# Patient Record
Sex: Male | Born: 1945
Health system: Southern US, Community
[De-identification: ages and names within clinical notes are randomized; demographics above are authoritative.]

## PROBLEM LIST (undated history)

## (undated) DIAGNOSIS — I10 Essential (primary) hypertension: Secondary | ICD-10-CM

## (undated) DIAGNOSIS — G8929 Other chronic pain: Secondary | ICD-10-CM

## (undated) DIAGNOSIS — M545 Low back pain, unspecified: Secondary | ICD-10-CM

## (undated) DIAGNOSIS — E7800 Pure hypercholesterolemia, unspecified: Secondary | ICD-10-CM

## (undated) DIAGNOSIS — I639 Cerebral infarction, unspecified: Secondary | ICD-10-CM

## (undated) DIAGNOSIS — R7303 Prediabetes: Secondary | ICD-10-CM

## (undated) DIAGNOSIS — G4733 Obstructive sleep apnea (adult) (pediatric): Secondary | ICD-10-CM

## (undated) HISTORY — PX: TONSILLECTOMY: SUR1361

## (undated) HISTORY — DX: Obstructive sleep apnea (adult) (pediatric): G47.33

## (undated) HISTORY — PX: HAND SURGERY: SHX662

## (undated) HISTORY — PX: HERNIA REPAIR: SHX51

## (undated) HISTORY — DX: Other chronic pain: G89.29

## (undated) HISTORY — PX: NOSE SURGERY: SHX723

## (undated) HISTORY — DX: Prediabetes: R73.03

## (undated) HISTORY — DX: Low back pain, unspecified: M54.50

---

## 2001-03-10 ENCOUNTER — Encounter: Payer: Self-pay | Admitting: Family Medicine

## 2001-03-10 ENCOUNTER — Encounter: Admission: RE | Admit: 2001-03-10 | Discharge: 2001-03-10 | Payer: Self-pay | Admitting: Family Medicine

## 2020-05-20 DIAGNOSIS — M545 Low back pain: Secondary | ICD-10-CM | POA: Diagnosis not present

## 2020-05-20 DIAGNOSIS — L309 Dermatitis, unspecified: Secondary | ICD-10-CM | POA: Diagnosis not present

## 2020-05-20 DIAGNOSIS — Z8719 Personal history of other diseases of the digestive system: Secondary | ICD-10-CM | POA: Diagnosis not present

## 2020-05-20 DIAGNOSIS — R7303 Prediabetes: Secondary | ICD-10-CM | POA: Diagnosis not present

## 2020-05-20 DIAGNOSIS — R739 Hyperglycemia, unspecified: Secondary | ICD-10-CM | POA: Diagnosis not present

## 2020-05-20 DIAGNOSIS — Z8673 Personal history of transient ischemic attack (TIA), and cerebral infarction without residual deficits: Secondary | ICD-10-CM | POA: Diagnosis not present

## 2020-05-20 DIAGNOSIS — E782 Mixed hyperlipidemia: Secondary | ICD-10-CM | POA: Diagnosis not present

## 2020-05-21 DIAGNOSIS — H905 Unspecified sensorineural hearing loss: Secondary | ICD-10-CM | POA: Diagnosis not present

## 2020-06-03 DIAGNOSIS — L309 Dermatitis, unspecified: Secondary | ICD-10-CM | POA: Diagnosis not present

## 2020-06-03 DIAGNOSIS — M545 Low back pain: Secondary | ICD-10-CM | POA: Diagnosis not present

## 2020-06-03 DIAGNOSIS — Z8673 Personal history of transient ischemic attack (TIA), and cerebral infarction without residual deficits: Secondary | ICD-10-CM | POA: Diagnosis not present

## 2020-06-03 DIAGNOSIS — Z8719 Personal history of other diseases of the digestive system: Secondary | ICD-10-CM | POA: Diagnosis not present

## 2020-06-03 DIAGNOSIS — Z7689 Persons encountering health services in other specified circumstances: Secondary | ICD-10-CM | POA: Diagnosis not present

## 2020-06-24 DIAGNOSIS — Z7689 Persons encountering health services in other specified circumstances: Secondary | ICD-10-CM | POA: Diagnosis not present

## 2020-06-24 DIAGNOSIS — E782 Mixed hyperlipidemia: Secondary | ICD-10-CM | POA: Diagnosis not present

## 2020-06-24 DIAGNOSIS — Z8673 Personal history of transient ischemic attack (TIA), and cerebral infarction without residual deficits: Secondary | ICD-10-CM | POA: Diagnosis not present

## 2020-06-24 DIAGNOSIS — B379 Candidiasis, unspecified: Secondary | ICD-10-CM | POA: Diagnosis not present

## 2020-06-24 DIAGNOSIS — I10 Essential (primary) hypertension: Secondary | ICD-10-CM | POA: Diagnosis not present

## 2020-07-25 DIAGNOSIS — Z7189 Other specified counseling: Secondary | ICD-10-CM | POA: Diagnosis not present

## 2020-07-25 DIAGNOSIS — M545 Low back pain, unspecified: Secondary | ICD-10-CM | POA: Diagnosis not present

## 2020-07-25 DIAGNOSIS — Z0001 Encounter for general adult medical examination with abnormal findings: Secondary | ICD-10-CM | POA: Diagnosis not present

## 2020-08-06 DIAGNOSIS — Z23 Encounter for immunization: Secondary | ICD-10-CM | POA: Diagnosis not present

## 2020-08-11 DIAGNOSIS — L918 Other hypertrophic disorders of the skin: Secondary | ICD-10-CM | POA: Diagnosis not present

## 2020-08-18 DIAGNOSIS — L918 Other hypertrophic disorders of the skin: Secondary | ICD-10-CM | POA: Diagnosis not present

## 2020-09-18 DIAGNOSIS — I10 Essential (primary) hypertension: Secondary | ICD-10-CM | POA: Diagnosis not present

## 2020-09-18 DIAGNOSIS — M25551 Pain in right hip: Secondary | ICD-10-CM | POA: Diagnosis not present

## 2020-09-18 DIAGNOSIS — M25561 Pain in right knee: Secondary | ICD-10-CM | POA: Diagnosis not present

## 2020-10-02 DIAGNOSIS — I10 Essential (primary) hypertension: Secondary | ICD-10-CM | POA: Diagnosis not present

## 2020-10-04 DIAGNOSIS — E782 Mixed hyperlipidemia: Secondary | ICD-10-CM | POA: Diagnosis not present

## 2020-10-04 DIAGNOSIS — I10 Essential (primary) hypertension: Secondary | ICD-10-CM | POA: Diagnosis not present

## 2020-10-30 DIAGNOSIS — M25551 Pain in right hip: Secondary | ICD-10-CM | POA: Diagnosis not present

## 2020-10-30 DIAGNOSIS — I10 Essential (primary) hypertension: Secondary | ICD-10-CM | POA: Diagnosis not present

## 2020-10-30 DIAGNOSIS — Z23 Encounter for immunization: Secondary | ICD-10-CM | POA: Diagnosis not present

## 2020-10-30 DIAGNOSIS — M25561 Pain in right knee: Secondary | ICD-10-CM | POA: Diagnosis not present

## 2020-11-03 DIAGNOSIS — E782 Mixed hyperlipidemia: Secondary | ICD-10-CM | POA: Diagnosis not present

## 2020-11-03 DIAGNOSIS — I10 Essential (primary) hypertension: Secondary | ICD-10-CM | POA: Diagnosis not present

## 2020-11-26 DIAGNOSIS — G8929 Other chronic pain: Secondary | ICD-10-CM | POA: Diagnosis not present

## 2020-11-26 DIAGNOSIS — M25561 Pain in right knee: Secondary | ICD-10-CM | POA: Diagnosis not present

## 2020-12-03 DIAGNOSIS — E782 Mixed hyperlipidemia: Secondary | ICD-10-CM | POA: Diagnosis not present

## 2020-12-03 DIAGNOSIS — I10 Essential (primary) hypertension: Secondary | ICD-10-CM | POA: Diagnosis not present

## 2020-12-26 DIAGNOSIS — G8929 Other chronic pain: Secondary | ICD-10-CM | POA: Diagnosis not present

## 2021-01-01 ENCOUNTER — Other Ambulatory Visit: Payer: Self-pay

## 2021-01-01 ENCOUNTER — Emergency Department (HOSPITAL_COMMUNITY): Payer: Medicare Other

## 2021-01-01 ENCOUNTER — Emergency Department (HOSPITAL_COMMUNITY)
Admission: EM | Admit: 2021-01-01 | Discharge: 2021-01-01 | Disposition: A | Discharge status: Home / Self Care | Payer: Medicare Other | Attending: Emergency Medicine | Admitting: Emergency Medicine

## 2021-01-01 DIAGNOSIS — J320 Chronic maxillary sinusitis: Secondary | ICD-10-CM | POA: Diagnosis not present

## 2021-01-01 DIAGNOSIS — R61 Generalized hyperhidrosis: Secondary | ICD-10-CM | POA: Diagnosis not present

## 2021-01-01 DIAGNOSIS — R42 Dizziness and giddiness: Secondary | ICD-10-CM | POA: Insufficient documentation

## 2021-01-01 DIAGNOSIS — R400 Somnolence: Secondary | ICD-10-CM | POA: Diagnosis not present

## 2021-01-01 LAB — COMPREHENSIVE METABOLIC PANEL
ALT: 40 U/L (ref 0–44)
AST: 25 U/L (ref 15–41)
Albumin: 4.1 g/dL (ref 3.5–5.0)
Alkaline Phosphatase: 60 U/L (ref 38–126)
Anion gap: 13 (ref 5–15)
BUN: 23 mg/dL (ref 8–23)
CO2: 26 mmol/L (ref 22–32)
Calcium: 8.8 mg/dL — ABNORMAL LOW (ref 8.9–10.3)
Chloride: 91 mmol/L — ABNORMAL LOW (ref 98–111)
Creatinine, Ser: 1.55 mg/dL — ABNORMAL HIGH (ref 0.61–1.24)
GFR, Estimated: 47 mL/min — ABNORMAL LOW (ref >60)
Glucose, Bld: 121 mg/dL — ABNORMAL HIGH (ref 70–99)
Potassium: 3 mmol/L — ABNORMAL LOW (ref 3.5–5.1)
Sodium: 130 mmol/L — ABNORMAL LOW (ref 135–145)
Total Bilirubin: 1 mg/dL (ref 0.3–1.2)
Total Protein: 7.4 g/dL (ref 6.5–8.1)

## 2021-01-01 LAB — CBC WITH DIFFERENTIAL/PLATELET
Abs Immature Granulocytes: 0.1 10*3/uL — ABNORMAL HIGH (ref 0.00–0.07)
Basophils Absolute: 0.1 10*3/uL (ref 0.0–0.1)
Basophils Relative: 1 %
Eosinophils Absolute: 0.2 10*3/uL (ref 0.0–0.5)
Eosinophils Relative: 2 %
HCT: 44.2 % (ref 39.0–52.0)
Hemoglobin: 16 g/dL (ref 13.0–17.0)
Immature Granulocytes: 1 %
Lymphocytes Relative: 19 %
Lymphs Abs: 2.1 10*3/uL (ref 0.7–4.0)
MCH: 33.2 pg (ref 26.0–34.0)
MCHC: 36.2 g/dL — ABNORMAL HIGH (ref 30.0–36.0)
MCV: 91.7 fL (ref 80.0–100.0)
Monocytes Absolute: 1.4 10*3/uL — ABNORMAL HIGH (ref 0.1–1.0)
Monocytes Relative: 12 %
Neutro Abs: 7.4 10*3/uL (ref 1.7–7.7)
Neutrophils Relative %: 65 %
Platelets: 257 10*3/uL (ref 150–400)
RBC: 4.82 MIL/uL (ref 4.22–5.81)
RDW: 13.4 % (ref 11.5–15.5)
WBC: 11.3 10*3/uL — ABNORMAL HIGH (ref 4.0–10.5)
nRBC: 0 % (ref 0.0–0.2)

## 2021-01-01 LAB — TROPONIN I (HIGH SENSITIVITY)
Troponin I (High Sensitivity): 5 ng/L (ref <18)
Troponin I (High Sensitivity): 6 ng/L (ref <18)

## 2021-01-01 IMAGING — CT CT HEAD W/O CM
3 series · 15 of 46 positions shown, 18 images · non-contrast
Comparison: None.

CLINICAL DATA: Dizzy, sweaty, thirsty, and sleepy.

EXAM:
CT HEAD WITHOUT CONTRAST
TECHNIQUE: Contiguous axial images were obtained from the base of the skull
through the vertex without intravenous contrast.

[Series 2: head w o · axial · 0.47mm/px · z∈[+1456,+1576]mm · 9 of 29 slices shown, 12 images]
[im 3/29  brain]
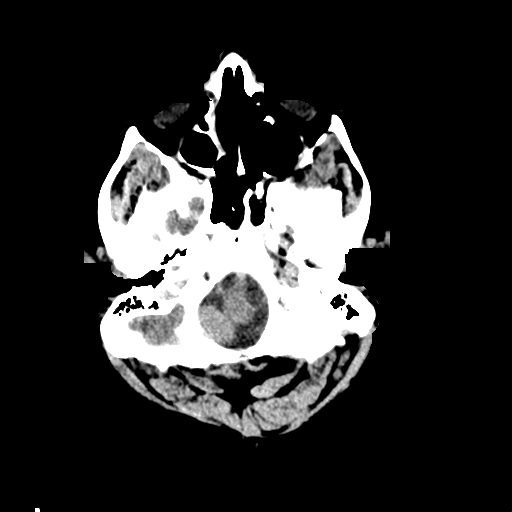
[im 3/29  bone]
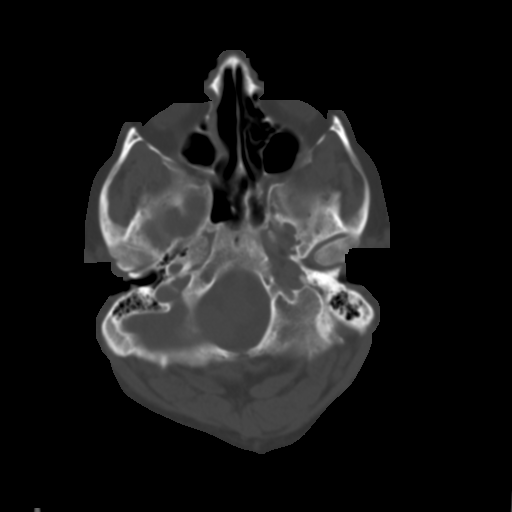
[im 6/29  brain]
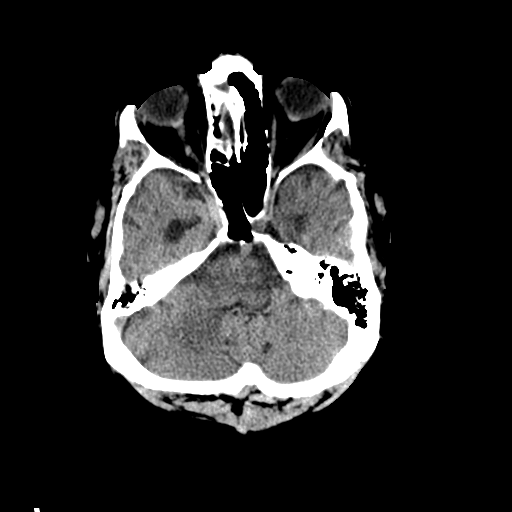
[im 9/29  brain]
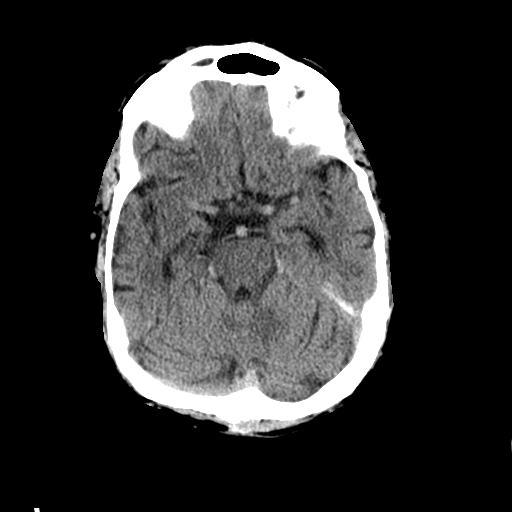
[im 12/29  brain]
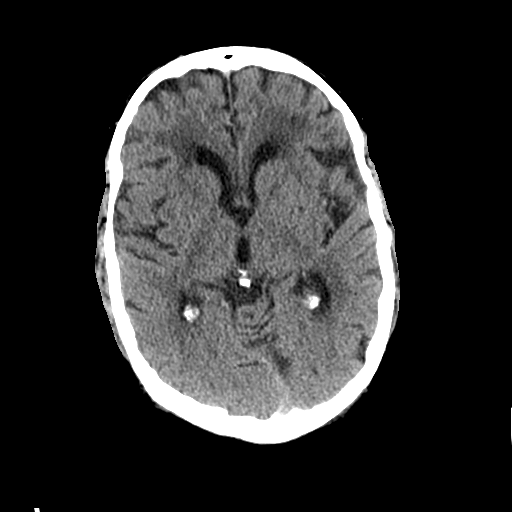
[im 15/29  brain]
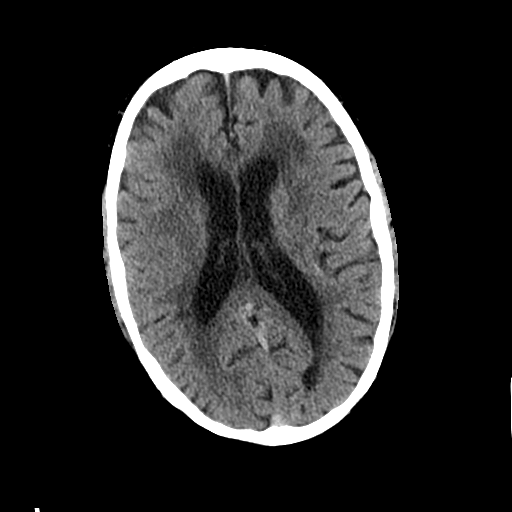
[im 15/29  bone]
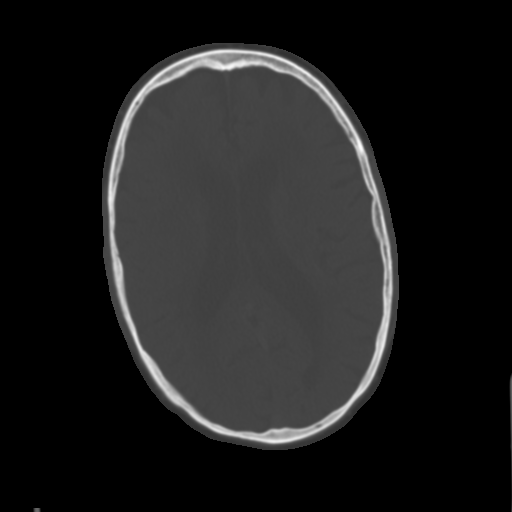
[im 18/29  brain]
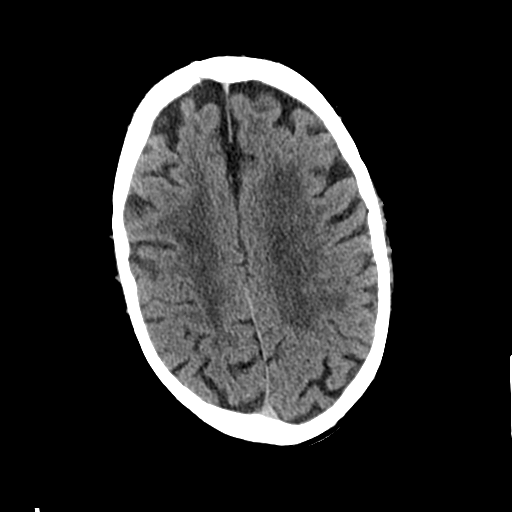
[im 21/29  brain]
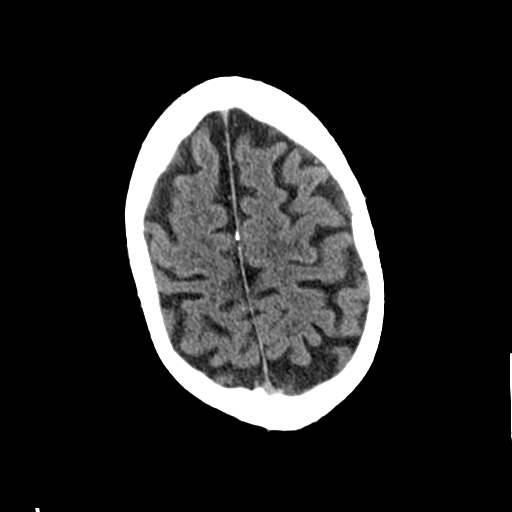
[im 24/29  brain]
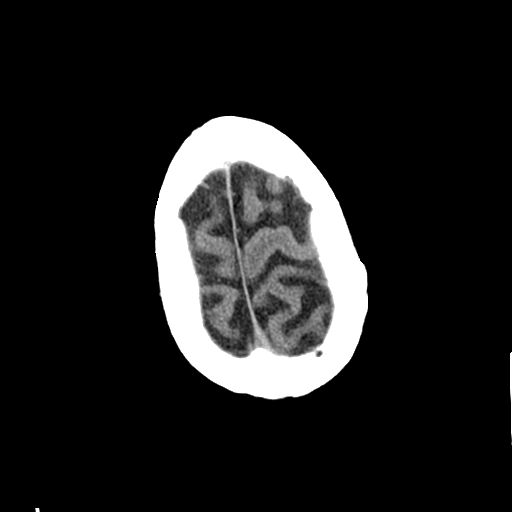
[im 27/29  brain]
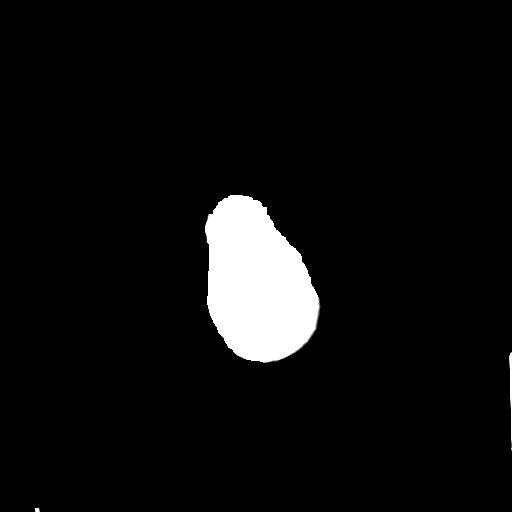
[im 27/29  bone]
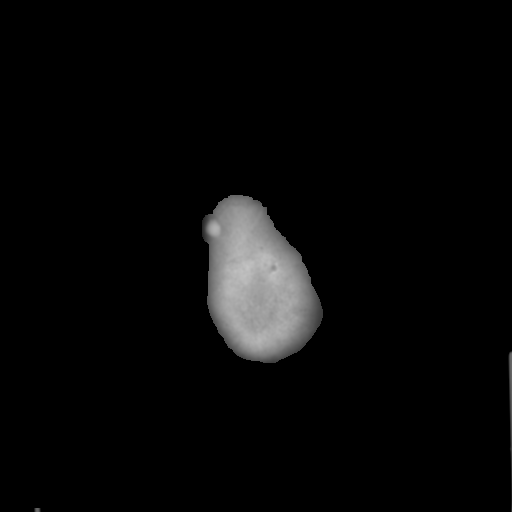

[Series 4: coronal soft · coronal · 0.31mm/px · 3 of 72 slices shown]
[im 24/72  brain]
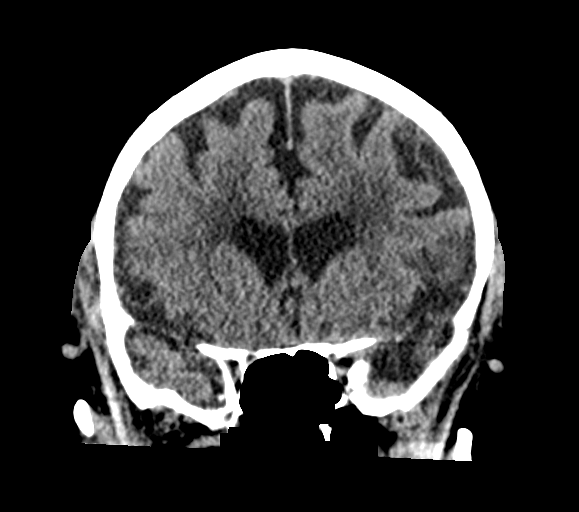
[im 32/72  brain]
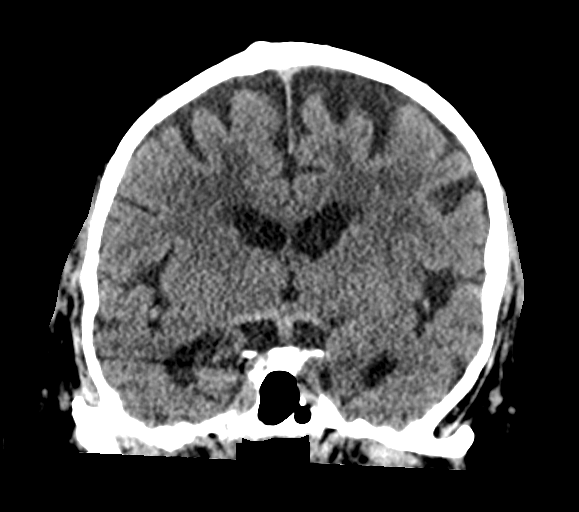
[im 40/72  brain]
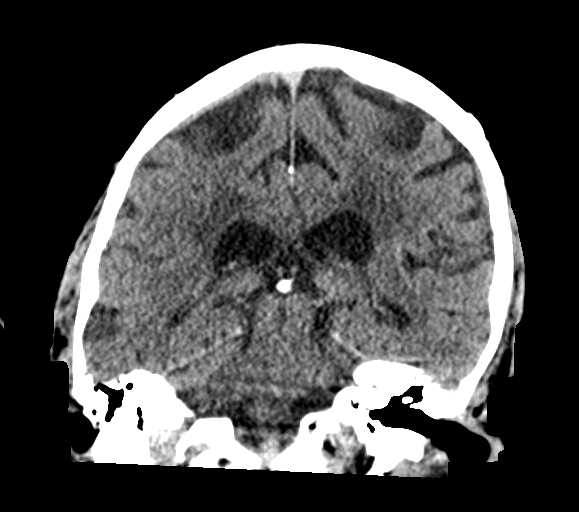

[Series 5: sagittal soft · sagittal · 0.34mm/px · 3 of 58 slices shown]
[im 20/58  brain]
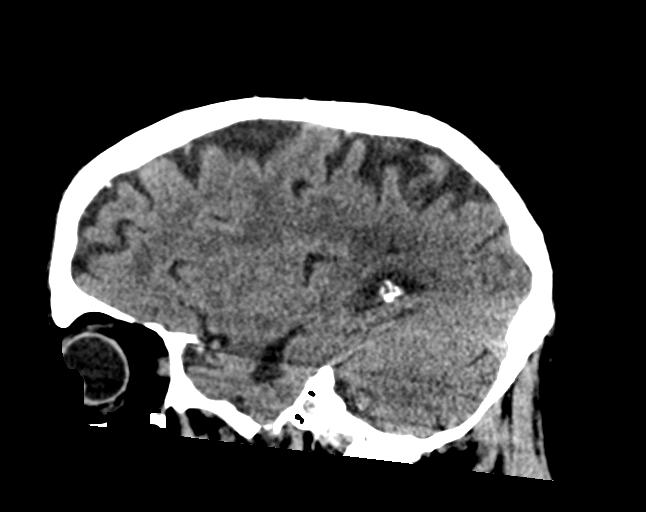
[im 29/58  brain]
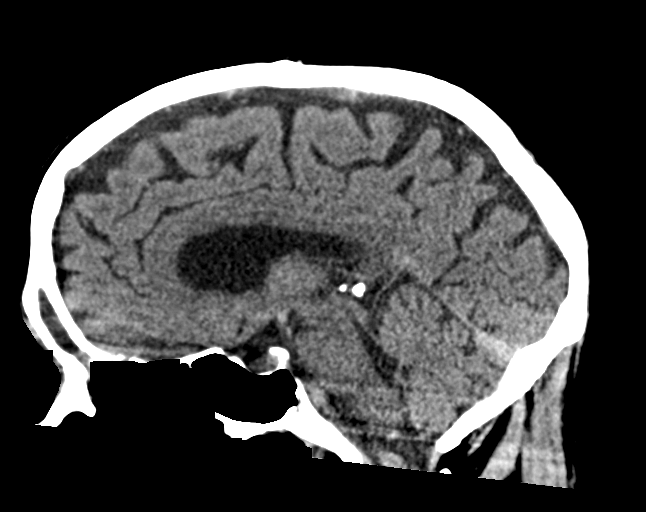
[im 39/58  brain]
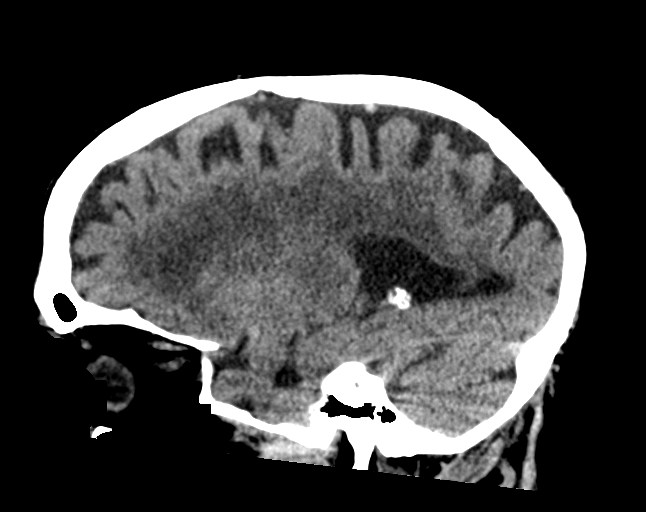

[15 of 46 positions shown; findings below may reference images not displayed]

FINDINGS: Brain: No evidence of acute infarction, hemorrhage, hydrocephalus,
extra-axial collection or mass lesion/mass effect. There is mild
cerebral volume loss with associated ex vacuo dilatation.
Periventricular white matter hypoattenuation likely represents
chronic small vessel ischemic disease.

Vascular: There are vascular calcifications in the carotid siphons.

Skull: Normal. Negative for fracture or focal lesion.

Sinuses/Orbits: There is right maxillary, ethmoid, and frontal sinus
disease.

Other: None.
IMPRESSION: 1. No acute intracranial process.
2. Right maxillary, ethmoid, and frontal sinus disease.

## 2021-01-01 MED ORDER — POTASSIUM CHLORIDE CRYS ER 20 MEQ PO TBCR
40.0000 meq | EXTENDED_RELEASE_TABLET | Freq: Once | ORAL | Status: AC
  Administered 2021-01-01: 40 meq via ORAL
  Filled 2021-01-01: qty 2

## 2021-01-01 MED ORDER — SODIUM CHLORIDE 0.9 % IV BOLUS
500.0000 mL | Freq: Once | INTRAVENOUS | Status: AC
  Administered 2021-01-01: 500 mL via INTRAVENOUS

## 2021-01-01 NOTE — ED Triage Notes (Signed)
Pt reports he began feeling dizzy, sweaty, thirsty and sleepy approx 1 hour ago. + nausea, no vomiting. Hx vertigo.

## 2021-01-01 NOTE — ED Notes (Signed)
Pt in CT at this time.

## 2021-01-01 NOTE — Discharge Instructions (Addendum)
Stop taking the tramadol and call your doctor tomorrow and tell him about the pain medicine.

## 2021-01-01 NOTE — ED Notes (Signed)
Pt given water per EDP ok

## 2021-01-01 NOTE — ED Provider Notes (Signed)
[facility] EMERGENCY DEPARTMENT Provider Note   CSN: [id number] Arrival date & time: 01/01/21  1943     History Chief Complaint  Patient presents with  . Dizziness    Austin Bruce is a 75 y.o. male.  Patient presents with some dizziness today.  He started taking a new medicine called tramadol 300 mg ER.  Patient also states that he had dizziness when he had a stroke last time but it was vertigo symptoms  The history is provided by the patient and medical records. No language interpreter was used.  Dizziness Quality:  Lightheadedness Severity:  Mild Onset quality:  Sudden Timing:  Constant Progression:  Worsening Chronicity:  New Context: not when bending over   Relieved by:  Nothing Worsened by:  Nothing Ineffective treatments:  None tried Associated symptoms: no chest pain, no diarrhea and no headaches        No past medical history on file.  There are no problems to display for this patient.        No family history on file.     Home Medications Prior to Admission medications   Not on File    Allergies    Penicillins  Review of Systems   Review of Systems  Constitutional: Negative for appetite change and fatigue.  HENT: Negative for congestion, ear discharge and sinus pressure.   Eyes: Negative for discharge.  Respiratory: Negative for cough.   Cardiovascular: Negative for chest pain.  Gastrointestinal: Negative for abdominal pain and diarrhea.  Genitourinary: Negative for frequency and hematuria.  Musculoskeletal: Negative for back pain.  Skin: Negative for rash.  Neurological: Positive for dizziness. Negative for seizures and headaches.  Psychiatric/Behavioral: Negative for hallucinations.    Physical Exam Updated Vital Signs BP (!) 150/86   Pulse 62   Temp 97.9 F (36.6 C) (Oral)   Resp 13   Ht 5\' 9"  (1.753 m)   Wt 104.4 kg   SpO2 98%   BMI 33.99 kg/m   Physical Exam Vitals and nursing note reviewed.  Constitutional:       Appearance: He is well-developed.  HENT:     Head: Normocephalic.     Nose: Nose normal.  Eyes:     General: No scleral icterus.    Conjunctiva/sclera: Conjunctivae normal.  Neck:     Thyroid: No thyromegaly.  Cardiovascular:     Rate and Rhythm: Normal rate and regular rhythm.     Heart sounds: No murmur heard. No friction rub. No gallop.   Pulmonary:     Breath sounds: No stridor. No wheezing or rales.  Chest:     Chest wall: No tenderness.  Abdominal:     General: There is no distension.     Tenderness: There is no abdominal tenderness. There is no rebound.  Musculoskeletal:        General: Normal range of motion.     Cervical back: Neck supple.  Lymphadenopathy:     Cervical: No cervical adenopathy.  Skin:    Findings: No erythema or rash.  Neurological:     Mental Status: He is alert and oriented to person, place, and time.     Motor: No abnormal muscle tone.     Coordination: Coordination normal.  Psychiatric:        Behavior: Behavior normal.     ED Results / Procedures / Treatments   Labs (all labs ordered are listed, but only abnormal results are displayed) Labs Reviewed  CBC WITH DIFFERENTIAL/PLATELET - Abnormal; Notable  for the following components:      Result Value   WBC 11.3 (*)    MCHC 36.2 (*)    Monocytes Absolute 1.4 (*)    Abs Immature Granulocytes 0.10 (*)    All other components within normal limits  COMPREHENSIVE METABOLIC PANEL - Abnormal; Notable for the following components:   Sodium 130 (*)    Potassium 3.0 (*)    Chloride 91 (*)    Glucose, Bld 121 (*)    Creatinine, Ser 1.55 (*)    Calcium 8.8 (*)    GFR, Estimated 47 (*)    All other components within normal limits  TROPONIN I (HIGH SENSITIVITY)  TROPONIN I (HIGH SENSITIVITY)    EKG None  Radiology CT Head Wo Contrast  Result Date: 01/01/2021 CLINICAL DATA:  Dizzy, sweaty, thirsty, and sleepy. EXAM: CT HEAD WITHOUT CONTRAST TECHNIQUE: Contiguous axial images were obtained  from the base of the skull through the vertex without intravenous contrast. COMPARISON:  None. FINDINGS: Brain: No evidence of acute infarction, hemorrhage, hydrocephalus, extra-axial collection or mass lesion/mass effect. There is mild cerebral volume loss with associated ex vacuo dilatation. Periventricular white matter hypoattenuation likely represents chronic small vessel ischemic disease. Vascular: There are vascular calcifications in the carotid siphons. Skull: Normal. Negative for fracture or focal lesion. Sinuses/Orbits: There is right maxillary, ethmoid, and frontal sinus disease. Other: None. IMPRESSION: 1. No acute intracranial process. 2. Right maxillary, ethmoid, and frontal sinus disease. Electronically Signed   By: Zerita Boers M.D.   On: 01/01/2021 21:07    Procedures Procedures   Medications Ordered in ED Medications  potassium chloride SA (KLOR-CON) CR tablet 40 mEq (has no administration in time range)  sodium chloride 0.9 % bolus 500 mL (0 mLs Intravenous Stopped 01/01/21 2214)    ED Course  I have reviewed the triage vital signs and the nursing notes.  Pertinent labs & imaging results that were available during my care of the patient were reviewed by me and considered in my medical decision making (see chart for details). CRITICAL CARE Performed by: Milton Ferguson Total critical care time: 45  minutes Critical care time was exclusive of separately billable procedures and treating other patients. Critical care was necessary to treat or prevent imminent or life-threatening deterioration. Critical care was time spent personally by me on the following activities: development of treatment plan with patient and/or surrogate as well as nursing, discussions with consultants, evaluation of patient's response to treatment, examination of patient, obtaining history from patient or surrogate, ordering and performing treatments and interventions, ordering and review of laboratory studies,  ordering and review of radiographic studies, pulse oximetry and re-evaluation of patient's condition.    MDM Rules/Calculators/A&P                          Patient labs and EKG, CT are all unremarkable.  Patient's dizziness has improved.  Suspect tramadol causing the dizziness.  He will stop that and call his doctor tomorrow for other medicine Final Clinical Impression(s) / ED Diagnoses Final diagnoses:  None    Rx / DC Orders ED Discharge Orders    None       Milton Ferguson, MD 01/03/21 1519

## 2021-01-03 DIAGNOSIS — E782 Mixed hyperlipidemia: Secondary | ICD-10-CM | POA: Diagnosis not present

## 2021-01-03 DIAGNOSIS — I10 Essential (primary) hypertension: Secondary | ICD-10-CM | POA: Diagnosis not present

## 2021-01-05 DIAGNOSIS — G8929 Other chronic pain: Secondary | ICD-10-CM | POA: Diagnosis not present

## 2021-01-05 DIAGNOSIS — R42 Dizziness and giddiness: Secondary | ICD-10-CM | POA: Diagnosis not present

## 2021-01-05 DIAGNOSIS — D229 Melanocytic nevi, unspecified: Secondary | ICD-10-CM | POA: Diagnosis not present

## 2021-01-05 DIAGNOSIS — Z09 Encounter for follow-up examination after completed treatment for conditions other than malignant neoplasm: Secondary | ICD-10-CM | POA: Diagnosis not present

## 2021-01-05 DIAGNOSIS — M545 Low back pain, unspecified: Secondary | ICD-10-CM | POA: Diagnosis not present

## 2021-01-13 DIAGNOSIS — L820 Inflamed seborrheic keratosis: Secondary | ICD-10-CM | POA: Diagnosis not present

## 2021-01-13 DIAGNOSIS — D229 Melanocytic nevi, unspecified: Secondary | ICD-10-CM | POA: Diagnosis not present

## 2021-01-13 DIAGNOSIS — I781 Nevus, non-neoplastic: Secondary | ICD-10-CM | POA: Diagnosis not present

## 2021-02-02 DIAGNOSIS — E782 Mixed hyperlipidemia: Secondary | ICD-10-CM | POA: Diagnosis not present

## 2021-02-02 DIAGNOSIS — I10 Essential (primary) hypertension: Secondary | ICD-10-CM | POA: Diagnosis not present

## 2021-03-05 DIAGNOSIS — E782 Mixed hyperlipidemia: Secondary | ICD-10-CM | POA: Diagnosis not present

## 2021-03-05 DIAGNOSIS — I10 Essential (primary) hypertension: Secondary | ICD-10-CM | POA: Diagnosis not present

## 2021-03-28 ENCOUNTER — Emergency Department (HOSPITAL_COMMUNITY): Payer: Medicare Other

## 2021-03-28 ENCOUNTER — Other Ambulatory Visit: Payer: Self-pay

## 2021-03-28 ENCOUNTER — Observation Stay (HOSPITAL_COMMUNITY)
Admission: EM | Admit: 2021-03-28 | Discharge: 2021-03-29 | Disposition: A | Discharge status: Home / Self Care | Payer: Medicare Other | Attending: Internal Medicine | Admitting: Internal Medicine

## 2021-03-28 ENCOUNTER — Encounter (HOSPITAL_COMMUNITY): Payer: Self-pay | Admitting: *Deleted

## 2021-03-28 ENCOUNTER — Observation Stay (HOSPITAL_COMMUNITY): Payer: Medicare Other

## 2021-03-28 DIAGNOSIS — H8110 Benign paroxysmal vertigo, unspecified ear: Secondary | ICD-10-CM

## 2021-03-28 DIAGNOSIS — R42 Dizziness and giddiness: Secondary | ICD-10-CM | POA: Diagnosis not present

## 2021-03-28 DIAGNOSIS — Z20822 Contact with and (suspected) exposure to covid-19: Secondary | ICD-10-CM | POA: Insufficient documentation

## 2021-03-28 DIAGNOSIS — Y900 Blood alcohol level of less than 20 mg/100 ml: Secondary | ICD-10-CM | POA: Insufficient documentation

## 2021-03-28 DIAGNOSIS — Z79899 Other long term (current) drug therapy: Secondary | ICD-10-CM | POA: Diagnosis not present

## 2021-03-28 DIAGNOSIS — I6381 Other cerebral infarction due to occlusion or stenosis of small artery: Secondary | ICD-10-CM | POA: Diagnosis not present

## 2021-03-28 DIAGNOSIS — I639 Cerebral infarction, unspecified: Secondary | ICD-10-CM | POA: Diagnosis not present

## 2021-03-28 DIAGNOSIS — R29818 Other symptoms and signs involving the nervous system: Secondary | ICD-10-CM | POA: Diagnosis not present

## 2021-03-28 DIAGNOSIS — Z8673 Personal history of transient ischemic attack (TIA), and cerebral infarction without residual deficits: Secondary | ICD-10-CM | POA: Diagnosis not present

## 2021-03-28 DIAGNOSIS — I6782 Cerebral ischemia: Secondary | ICD-10-CM | POA: Diagnosis not present

## 2021-03-28 DIAGNOSIS — Z87891 Personal history of nicotine dependence: Secondary | ICD-10-CM

## 2021-03-28 DIAGNOSIS — Z8719 Personal history of other diseases of the digestive system: Secondary | ICD-10-CM

## 2021-03-28 DIAGNOSIS — R200 Anesthesia of skin: Secondary | ICD-10-CM | POA: Diagnosis not present

## 2021-03-28 DIAGNOSIS — N1832 Chronic kidney disease, stage 3b: Secondary | ICD-10-CM | POA: Diagnosis not present

## 2021-03-28 DIAGNOSIS — G319 Degenerative disease of nervous system, unspecified: Secondary | ICD-10-CM | POA: Diagnosis not present

## 2021-03-28 DIAGNOSIS — R41841 Cognitive communication deficit: Secondary | ICD-10-CM | POA: Insufficient documentation

## 2021-03-28 DIAGNOSIS — E7800 Pure hypercholesterolemia, unspecified: Secondary | ICD-10-CM | POA: Diagnosis not present

## 2021-03-28 DIAGNOSIS — I10 Essential (primary) hypertension: Secondary | ICD-10-CM | POA: Diagnosis present

## 2021-03-28 DIAGNOSIS — I129 Hypertensive chronic kidney disease with stage 1 through stage 4 chronic kidney disease, or unspecified chronic kidney disease: Secondary | ICD-10-CM | POA: Diagnosis not present

## 2021-03-28 DIAGNOSIS — E876 Hypokalemia: Secondary | ICD-10-CM | POA: Diagnosis present

## 2021-03-28 HISTORY — DX: Cerebral infarction, unspecified: I63.9

## 2021-03-28 HISTORY — DX: Essential (primary) hypertension: I10

## 2021-03-28 HISTORY — DX: Pure hypercholesterolemia, unspecified: E78.00

## 2021-03-28 LAB — DIFFERENTIAL
Abs Immature Granulocytes: 0.05 10*3/uL (ref 0.00–0.07)
Basophils Absolute: 0.1 10*3/uL (ref 0.0–0.1)
Basophils Relative: 1 %
Eosinophils Absolute: 0.3 10*3/uL (ref 0.0–0.5)
Eosinophils Relative: 3 %
Immature Granulocytes: 1 %
Lymphocytes Relative: 17 %
Lymphs Abs: 1.7 10*3/uL (ref 0.7–4.0)
Monocytes Absolute: 1.4 10*3/uL — ABNORMAL HIGH (ref 0.1–1.0)
Monocytes Relative: 14 %
Neutro Abs: 6.3 10*3/uL (ref 1.7–7.7)
Neutrophils Relative %: 64 %

## 2021-03-28 LAB — URINALYSIS, ROUTINE W REFLEX MICROSCOPIC
Bilirubin Urine: NEGATIVE
Glucose, UA: NEGATIVE mg/dL
Hgb urine dipstick: NEGATIVE
Ketones, ur: NEGATIVE mg/dL
Leukocytes,Ua: NEGATIVE
Nitrite: NEGATIVE
Protein, ur: NEGATIVE mg/dL
Specific Gravity, Urine: 1.004 — ABNORMAL LOW (ref 1.005–1.030)
pH: 7 (ref 5.0–8.0)

## 2021-03-28 LAB — CBC
HCT: 44.6 % (ref 39.0–52.0)
Hemoglobin: 16.1 g/dL (ref 13.0–17.0)
MCH: 33.1 pg (ref 26.0–34.0)
MCHC: 36.1 g/dL — ABNORMAL HIGH (ref 30.0–36.0)
MCV: 91.8 fL (ref 80.0–100.0)
Platelets: 242 10*3/uL (ref 150–400)
RBC: 4.86 MIL/uL (ref 4.22–5.81)
RDW: 13.4 % (ref 11.5–15.5)
WBC: 9.7 10*3/uL (ref 4.0–10.5)
nRBC: 0 % (ref 0.0–0.2)

## 2021-03-28 LAB — RESP PANEL BY RT-PCR (FLU A&B, COVID) ARPGX2
Influenza A by PCR: NEGATIVE
Influenza B by PCR: NEGATIVE
SARS Coronavirus 2 by RT PCR: NEGATIVE

## 2021-03-28 LAB — I-STAT CHEM 8, ED
BUN: 15 mg/dL (ref 8–23)
Calcium, Ion: 1.17 mmol/L (ref 1.15–1.40)
Chloride: 95 mmol/L — ABNORMAL LOW (ref 98–111)
Creatinine, Ser: 1.7 mg/dL — ABNORMAL HIGH (ref 0.61–1.24)
Glucose, Bld: 107 mg/dL — ABNORMAL HIGH (ref 70–99)
HCT: 47 % (ref 39.0–52.0)
Hemoglobin: 16 g/dL (ref 13.0–17.0)
Potassium: 3.4 mmol/L — ABNORMAL LOW (ref 3.5–5.1)
Sodium: 134 mmol/L — ABNORMAL LOW (ref 135–145)
TCO2: 29 mmol/L (ref 22–32)

## 2021-03-28 LAB — COMPREHENSIVE METABOLIC PANEL
ALT: 39 U/L (ref 0–44)
AST: 26 U/L (ref 15–41)
Albumin: 4 g/dL (ref 3.5–5.0)
Alkaline Phosphatase: 58 U/L (ref 38–126)
Anion gap: 8 (ref 5–15)
BUN: 15 mg/dL (ref 8–23)
CO2: 26 mmol/L (ref 22–32)
Calcium: 9.1 mg/dL (ref 8.9–10.3)
Chloride: 97 mmol/L — ABNORMAL LOW (ref 98–111)
Creatinine, Ser: 1.62 mg/dL — ABNORMAL HIGH (ref 0.61–1.24)
GFR, Estimated: 44 mL/min — ABNORMAL LOW (ref >60)
Glucose, Bld: 108 mg/dL — ABNORMAL HIGH (ref 70–99)
Potassium: 3.2 mmol/L — ABNORMAL LOW (ref 3.5–5.1)
Sodium: 131 mmol/L — ABNORMAL LOW (ref 135–145)
Total Bilirubin: 1.4 mg/dL — ABNORMAL HIGH (ref 0.3–1.2)
Total Protein: 7.5 g/dL (ref 6.5–8.1)

## 2021-03-28 LAB — APTT: aPTT: 27 seconds (ref 24–36)

## 2021-03-28 LAB — RAPID URINE DRUG SCREEN, HOSP PERFORMED
Amphetamines: NOT DETECTED
Barbiturates: NOT DETECTED
Benzodiazepines: NOT DETECTED
Cocaine: NOT DETECTED
Opiates: NOT DETECTED
Tetrahydrocannabinol: NOT DETECTED

## 2021-03-28 LAB — CBG MONITORING, ED: Glucose-Capillary: 104 mg/dL — ABNORMAL HIGH (ref 70–99)

## 2021-03-28 LAB — PROTIME-INR
INR: 1 (ref 0.8–1.2)
Prothrombin Time: 13.3 seconds (ref 11.4–15.2)

## 2021-03-28 LAB — ETHANOL: Alcohol, Ethyl (B): <10 mg/dL (ref <10)

## 2021-03-28 IMAGING — MR MR MRA NECK W/O CM
4 of 6 series · 6 of 48 positions shown · non-contrast
Comparison: Head CT [DATE]

CLINICAL DATA: Neuro deficit, acute, stroke suspected. Dizziness.
Numbness in the legs.



[Series 13: sag inhance (id) · sagittal · 1.2mm · 0.47mm/px · 3 of 359 slices shown]
[im 62/359]
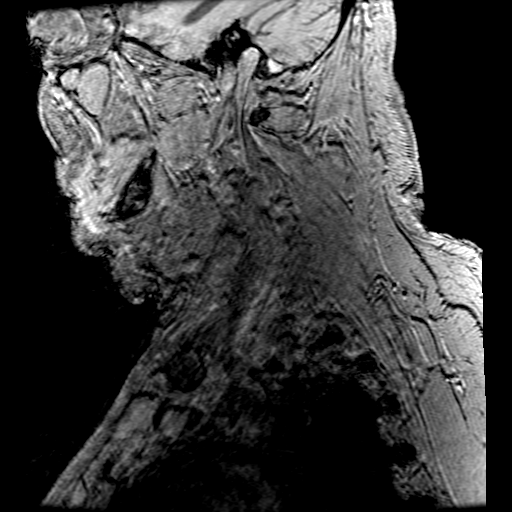
[im 186/359]
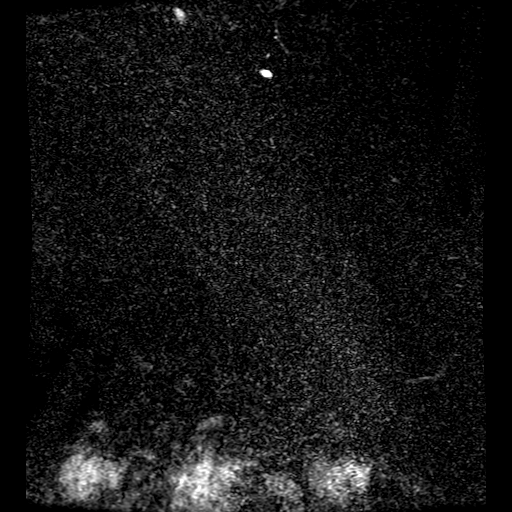
[im 309/359]
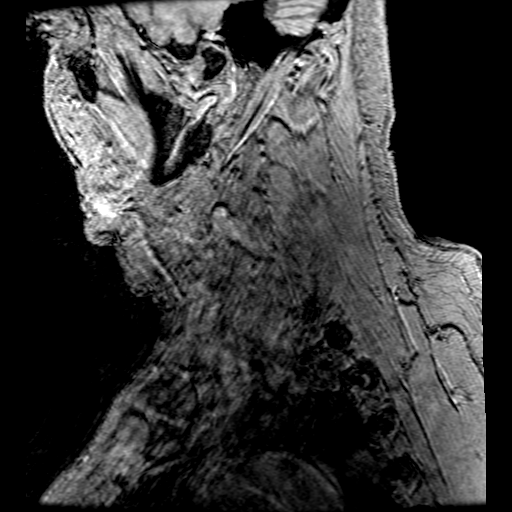

[Series 1350: projection images · oblique · 1.2mm · 0.21mm/px · 1 of 3 slices shown (1 of 3)]
[im 1/3]
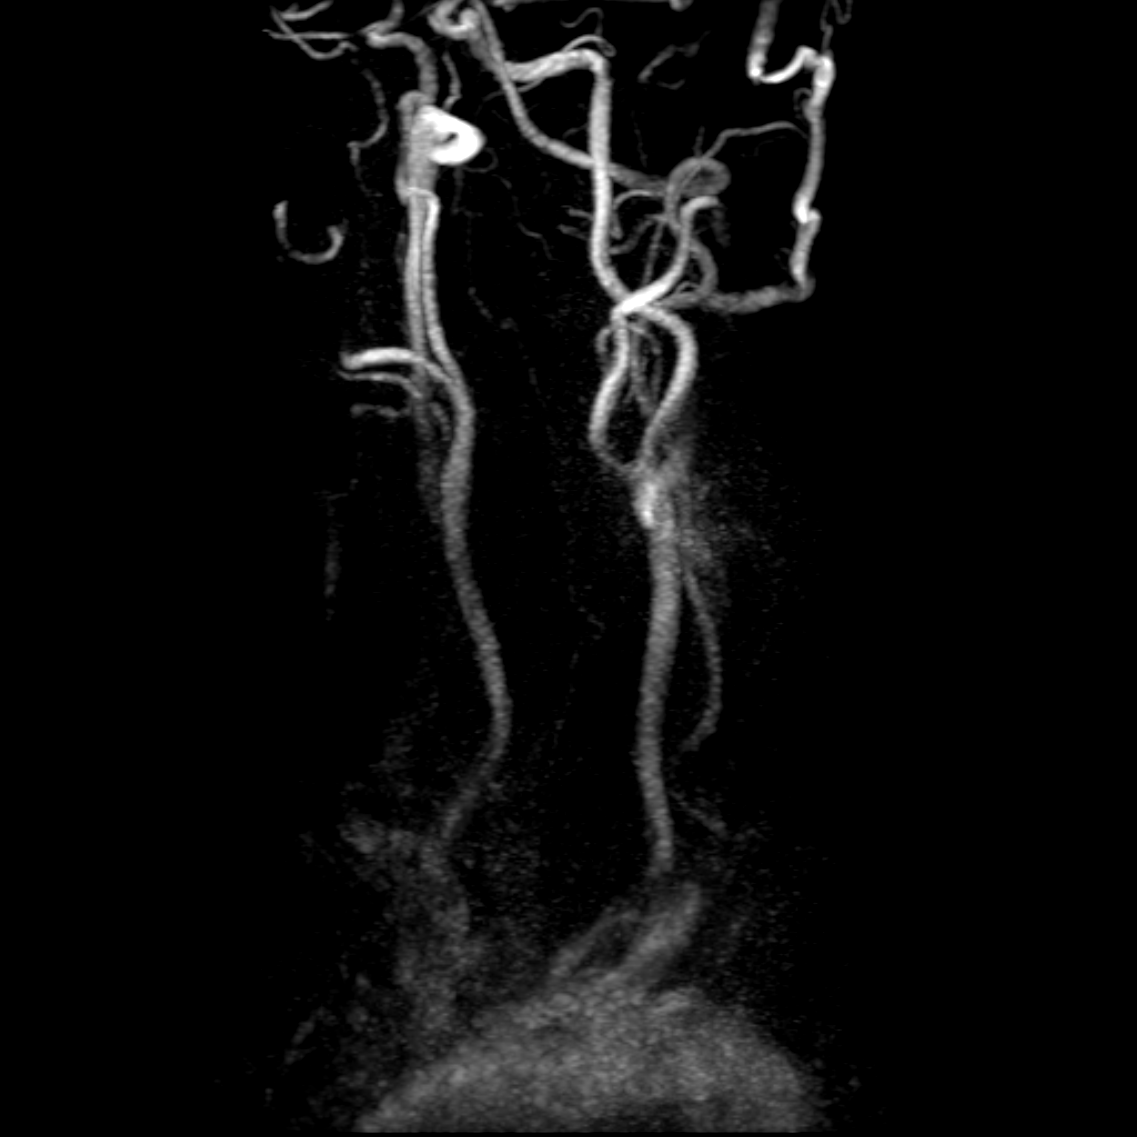

[Series 1352: projection images · oblique · 1.2mm · 0.21mm/px · 1 of 3 slices shown (2 of 3)]
[im 1/3]
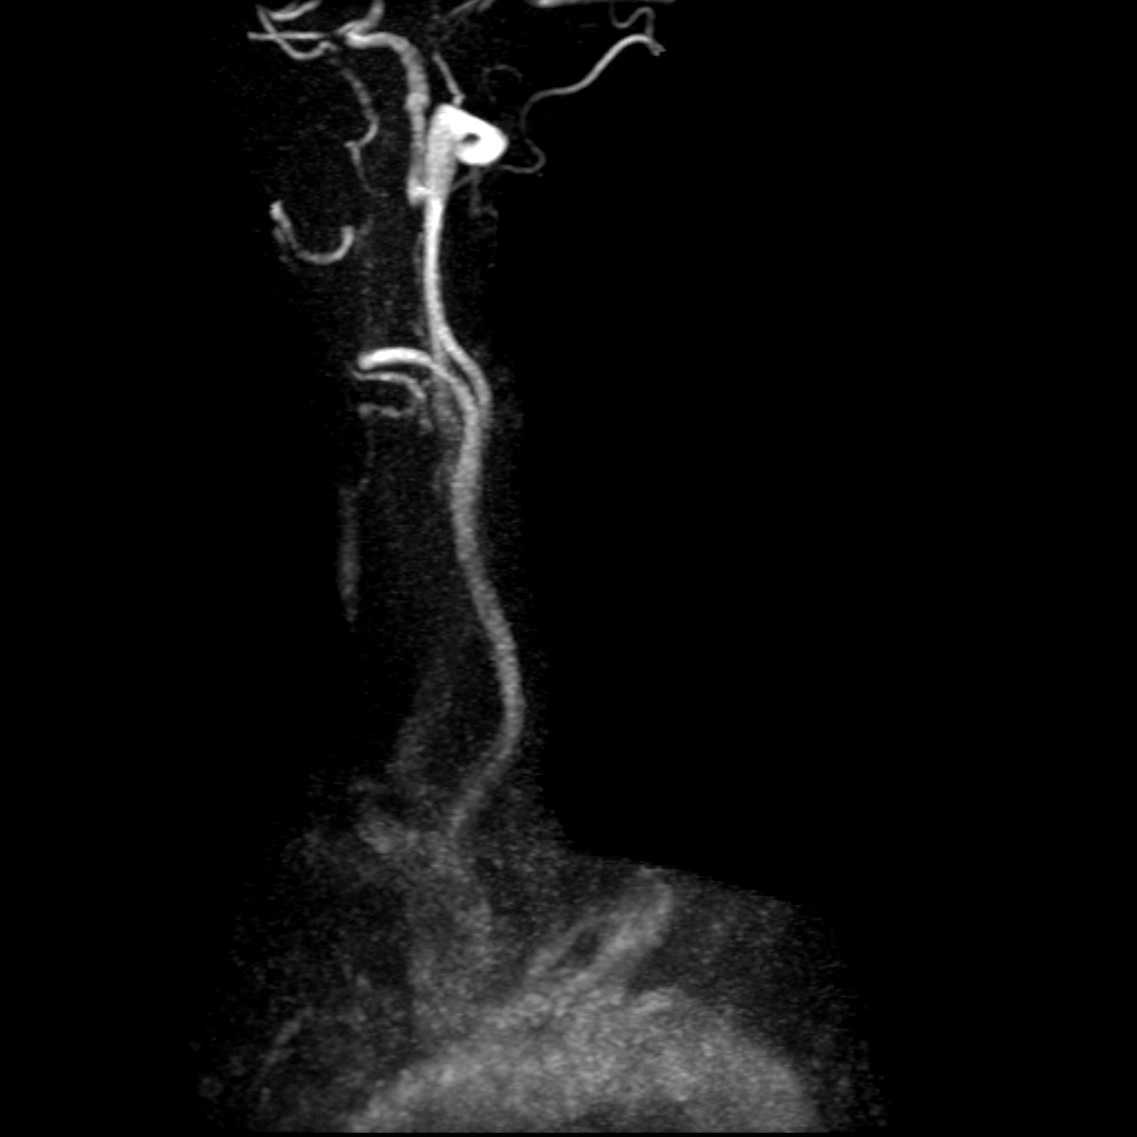

[Series 1353: projection images · sagittal · 1.2mm · 0.21mm/px · 1 of 5 slices shown (3 of 3)]
[im 1/5]
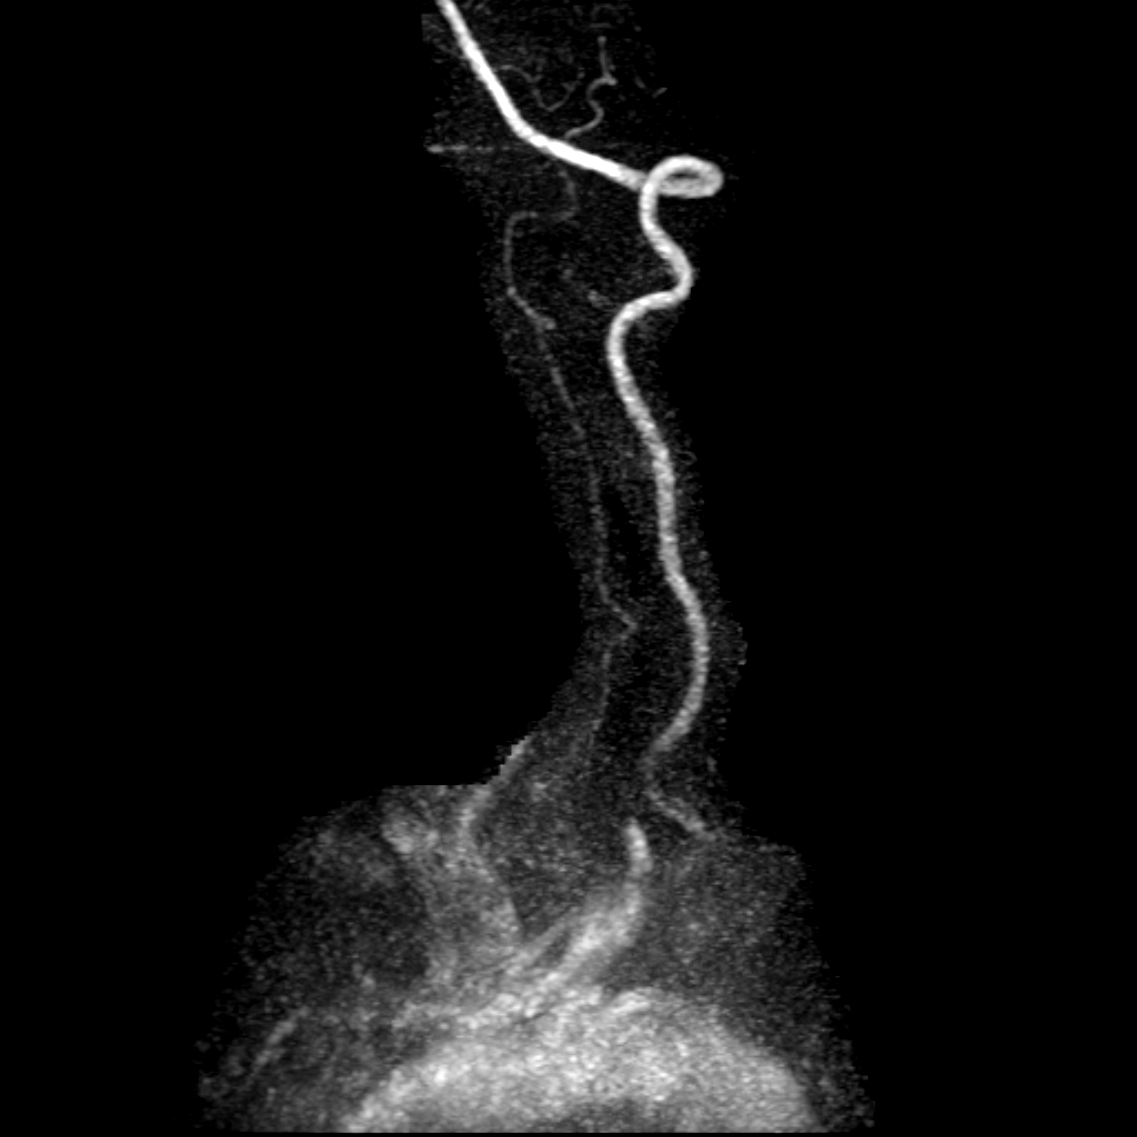

[6 of 48 positions shown; findings below may reference images not displayed]

FINDINGS: MRI HEAD FINDINGS

Brain: There is a small focus of diffusion abnormality involving
subcortical white matter in the high left frontal lobe suggestive of
an acute or subacute small vessel infarct. No acute infarct is
identified elsewhere. There are chronic microhemorrhages scattered
throughout both cerebral hemispheres including in the basal ganglia
as well as in the cerebellum which may be secondary to chronic
hypertension. Confluent T2 hyperintensities in the cerebral white
matter bilaterally are nonspecific but compatible with severe
chronic small vessel ischemic disease. There is a chronic lacunar
infarct involving the right thalamus and posterior limb of the
internal capsule. There is also a small chronic infarct inferiorly
in the right cerebellar hemisphere. There is mild-to-moderate
cerebral atrophy. No mass, midline shift, or extra-axial fluid
collection is identified.

Vascular: Major intracranial vascular flow voids are preserved.

Skull and upper cervical spine: Unremarkable bone marrow signal.

Sinuses/Orbits: Unremarkable orbits. Chronic sinusitis in a right
middle meatus obstruction pattern as previously noted. No
significant mastoid fluid.

Other: None.

MRA HEAD FINDINGS

The included intracranial portions of the vertebral arteries are
patent to the basilar with the left being strongly dominant. Patent
PICA and SCA origins are identified bilaterally. The basilar artery
is widely patent. There is a small right posterior communicating
artery. Both PCAs are patent without evidence of a significant
proximal stenosis.

The internal carotid arteries are patent from skull base to carotid
termini without evidence of a significant stenosis on the left.
Partial signal loss in the right paraclinoid ICA is favored to be
artifactual given normal appearance on the neck MRA. ACAs and MCAs
are patent without evidence of a significant left M1 or left A1
stenosis. Signal loss involving the distal right M1 segment and
bilateral proximal M2 branches is likely artifactual but limits
assessment for a stenosis in these areas. The right A1 segment is
severely hypoplastic. No aneurysm is identified.

MRA NECK FINDINGS

There is a normal variant aortic arch branching pattern with common
origin of the brachiocephalic and left common carotid arteries.
Motion artifact limits assessment of the proximal common carotid
arteries, however the mid and distal common carotid arteries and
cervical internal carotid arteries are widely patent bilaterally
without evidence of a significant stenosis or dissection.

The vertebral arteries are patent with antegrade flow bilaterally
and with the left being dominant. Motion artifact limits assessment
of the vertebral artery origins, however there is no evidence of a
significant stenosis or dissection elsewhere on either side.
IMPRESSION: 1. Small acute or subacute white matter infarct in the left frontal
lobe.
2. Severe chronic small vessel ischemic disease with chronic lacunar
infarcts in the cerebrum and cerebellum.
3. No major arterial occlusion or convincing flow limiting proximal
stenosis in the head and neck within limitations of artifact as
detailed above.

## 2021-03-28 IMAGING — MR MR HEAD W/O CM
6 of 10 series · 27 of 48 positions shown · non-contrast
Comparison: Head CT [DATE]

CLINICAL DATA: Neuro deficit, acute, stroke suspected. Dizziness.
Numbness in the legs.



[Series 2: DWI · axial · 3.0mm · 0.94mm/px · z∈[-86,+63]mm · 9 of 104 slices shown (1 of 2)]
[im 1/104]
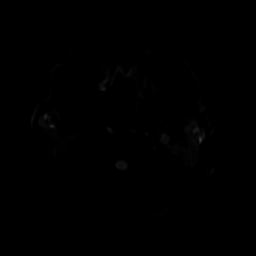
[im 13/104]
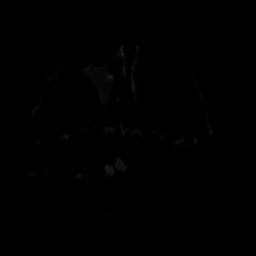
[im 26/104]
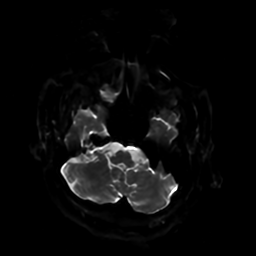
[im 39/104]
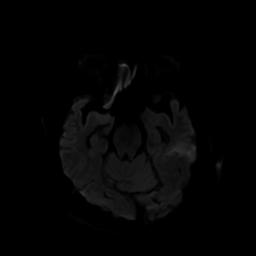
[im 52/104]
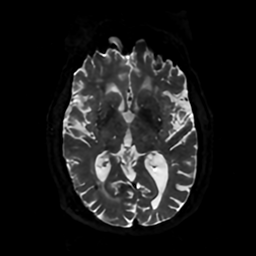
[im 65/104]
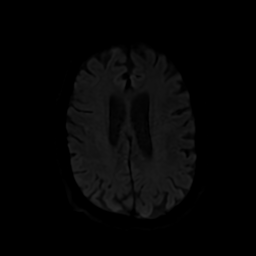
[im 78/104]
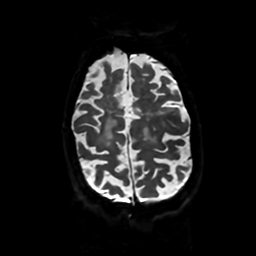
[im 91/104]
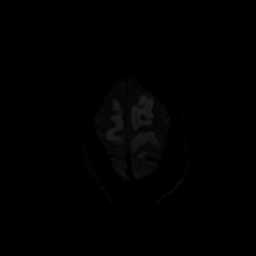
[im 104/104]
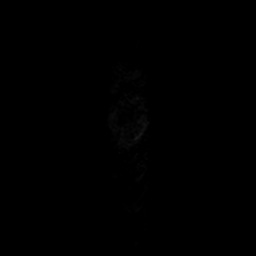

[Series 4: DWI · coronal · 4.0mm · 0.94mm/px · 7 of 78 slices shown (2 of 2)]
[im 1/78]
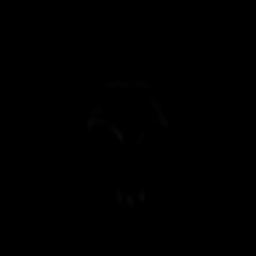
[im 13/78]
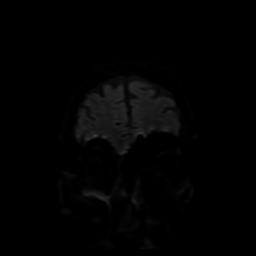
[im 26/78]
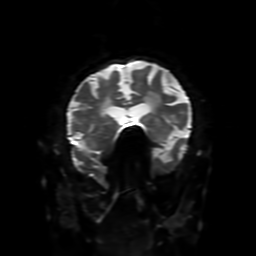
[im 39/78]
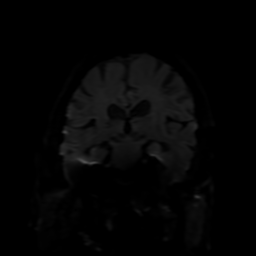
[im 52/78]
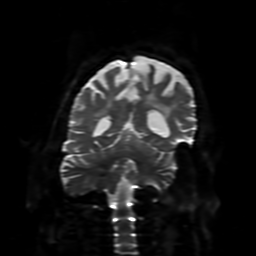
[im 65/78]
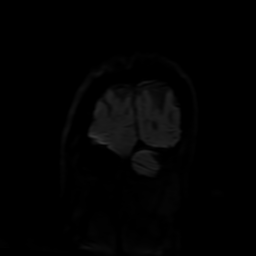
[im 78/78]
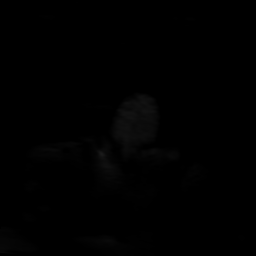

[Series 5: FLAIR · sagittal · 5.0mm · 0.23mm/px · 2 of 25 slices shown (1 of 2)]
[im 1/25]
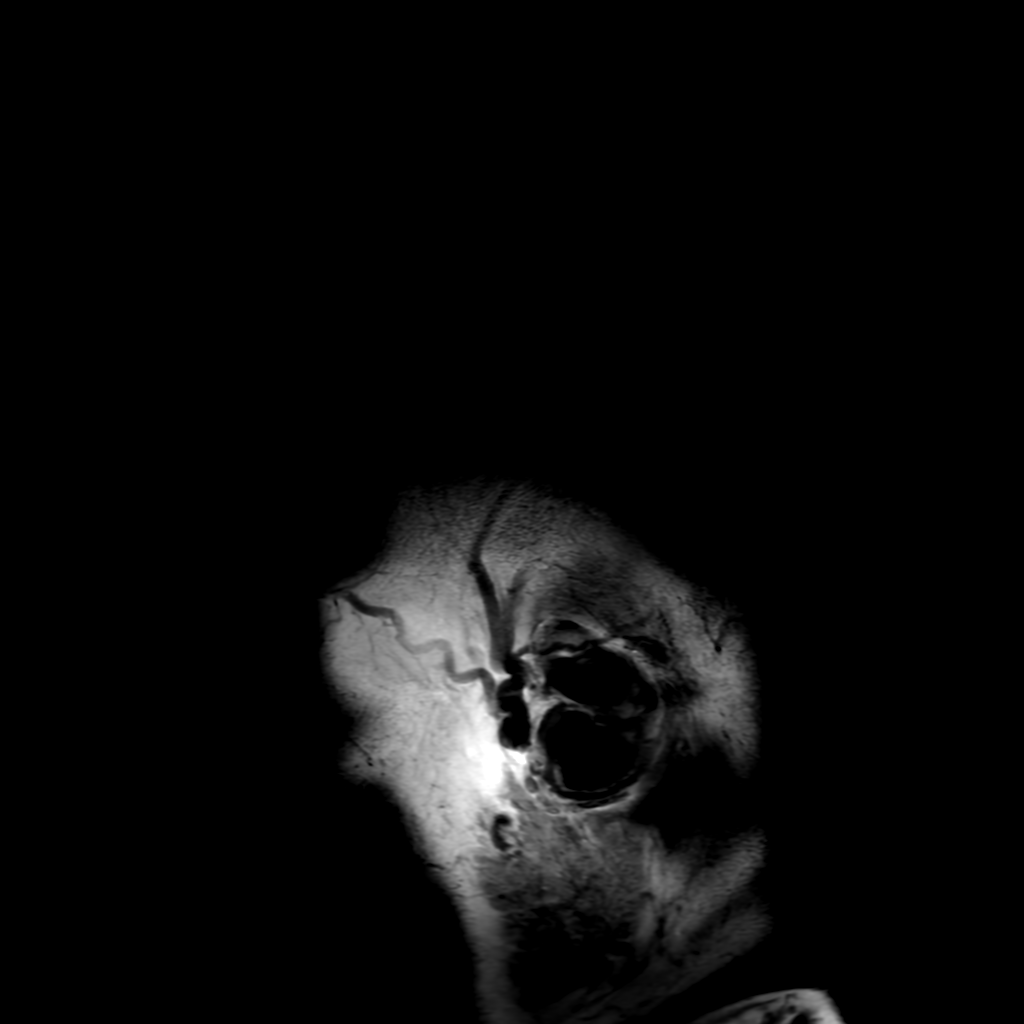
[im 25/25]
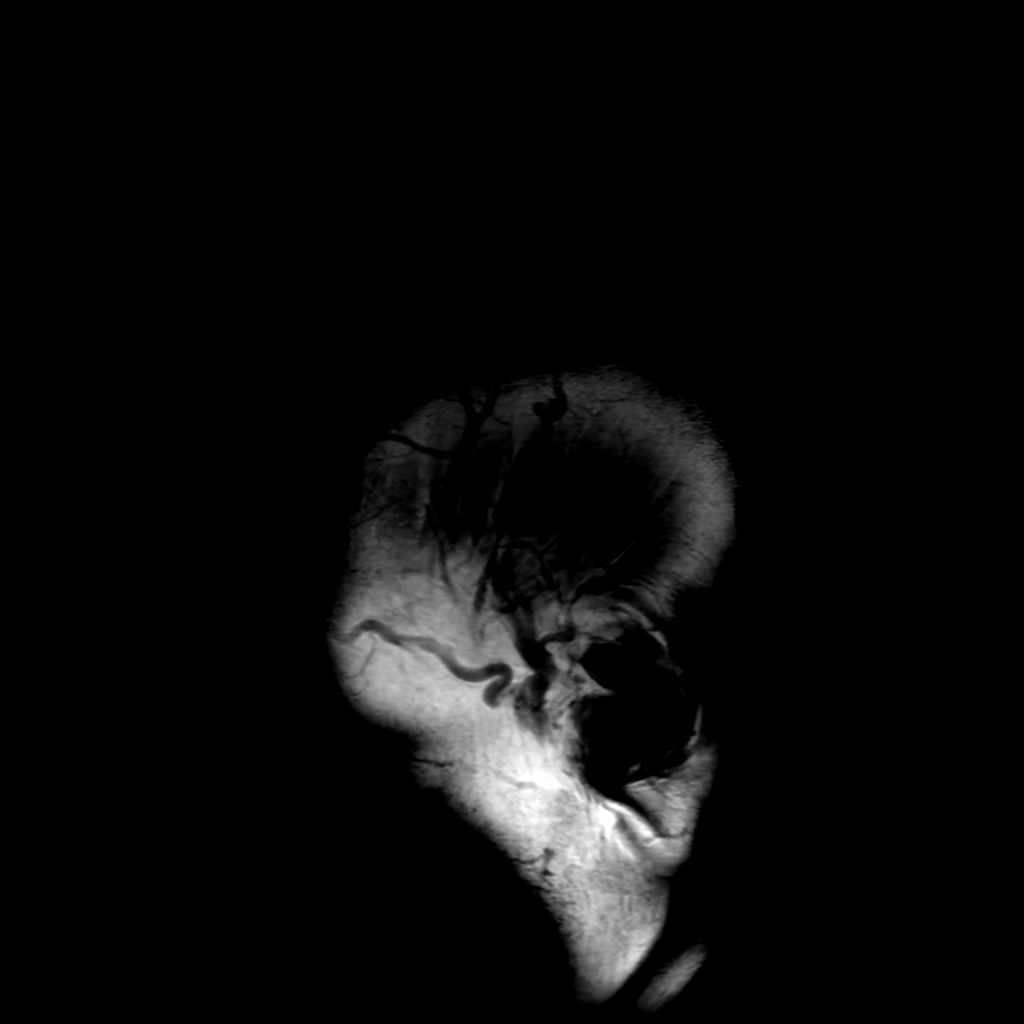

[Series 7: FLAIR · axial · 4.0mm · 0.45mm/px · z∈[-83,+63]mm · 3 of 35 slices shown (2 of 2)]
[im 1/35]
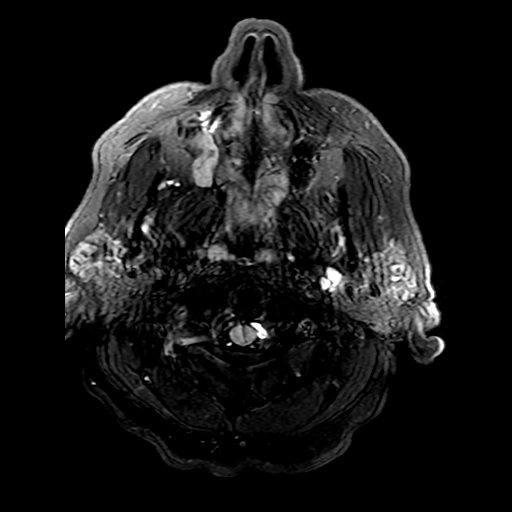
[im 18/35]
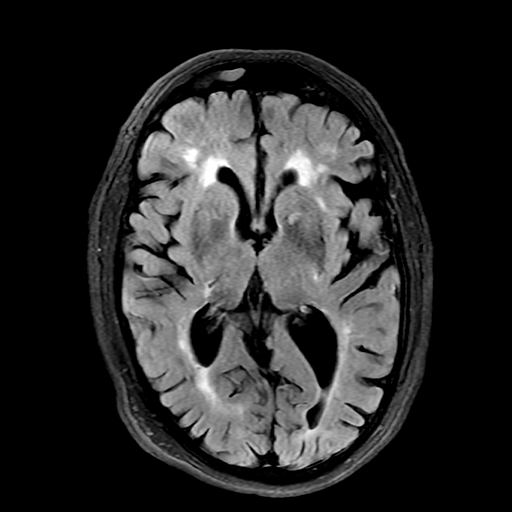
[im 35/35]
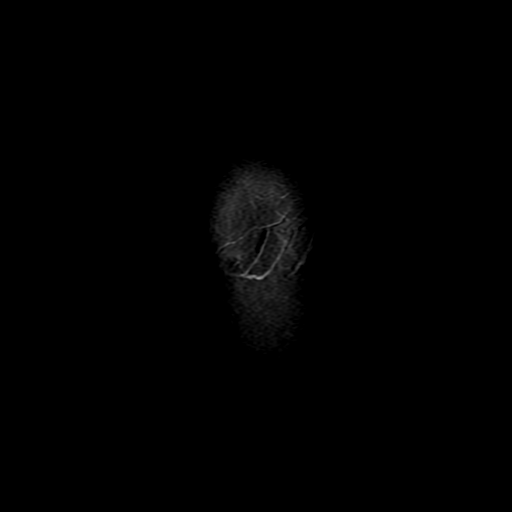

[Series 250: ADC · axial · 3.0mm · 0.94mm/px · z∈[-86,+63]mm · 5 of 52 slices shown (1 of 2)]
[im 1/52]
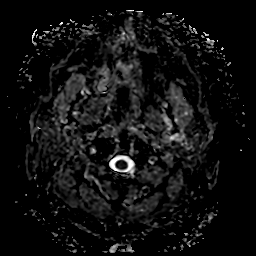
[im 13/52]
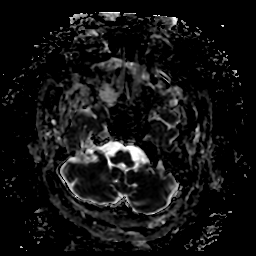
[im 26/52]
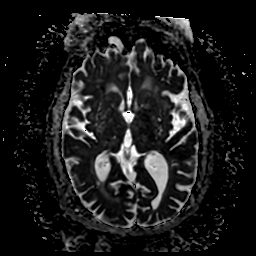
[im 39/52]
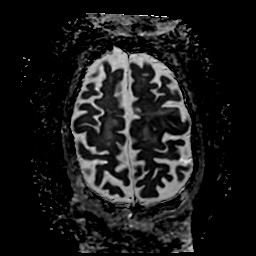
[im 52/52]
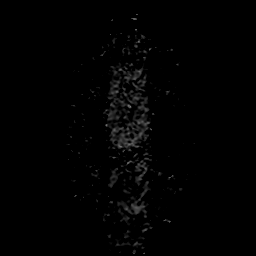

[Series 450: ADC · coronal · 4.0mm · 0.94mm/px · 1 of 39 slices shown (2 of 2)]
[im 1/39]
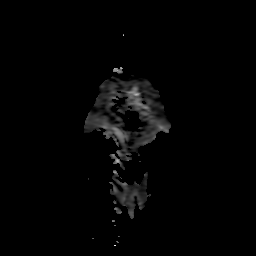

[27 of 48 positions shown; findings below may reference images not displayed]

FINDINGS: MRI HEAD FINDINGS

Brain: There is a small focus of diffusion abnormality involving
subcortical white matter in the high left frontal lobe suggestive of
an acute or subacute small vessel infarct. No acute infarct is
identified elsewhere. There are chronic microhemorrhages scattered
throughout both cerebral hemispheres including in the basal ganglia
as well as in the cerebellum which may be secondary to chronic
hypertension. Confluent T2 hyperintensities in the cerebral white
matter bilaterally are nonspecific but compatible with severe
chronic small vessel ischemic disease. There is a chronic lacunar
infarct involving the right thalamus and posterior limb of the
internal capsule. There is also a small chronic infarct inferiorly
in the right cerebellar hemisphere. There is mild-to-moderate
cerebral atrophy. No mass, midline shift, or extra-axial fluid
collection is identified.

Vascular: Major intracranial vascular flow voids are preserved.

Skull and upper cervical spine: Unremarkable bone marrow signal.

Sinuses/Orbits: Unremarkable orbits. Chronic sinusitis in a right
middle meatus obstruction pattern as previously noted. No
significant mastoid fluid.

Other: None.

MRA HEAD FINDINGS

The included intracranial portions of the vertebral arteries are
patent to the basilar with the left being strongly dominant. Patent
PICA and SCA origins are identified bilaterally. The basilar artery
is widely patent. There is a small right posterior communicating
artery. Both PCAs are patent without evidence of a significant
proximal stenosis.

The internal carotid arteries are patent from skull base to carotid
termini without evidence of a significant stenosis on the left.
Partial signal loss in the right paraclinoid ICA is favored to be
artifactual given normal appearance on the neck MRA. ACAs and MCAs
are patent without evidence of a significant left M1 or left A1
stenosis. Signal loss involving the distal right M1 segment and
bilateral proximal M2 branches is likely artifactual but limits
assessment for a stenosis in these areas. The right A1 segment is
severely hypoplastic. No aneurysm is identified.

MRA NECK FINDINGS

There is a normal variant aortic arch branching pattern with common
origin of the brachiocephalic and left common carotid arteries.
Motion artifact limits assessment of the proximal common carotid
arteries, however the mid and distal common carotid arteries and
cervical internal carotid arteries are widely patent bilaterally
without evidence of a significant stenosis or dissection.

The vertebral arteries are patent with antegrade flow bilaterally
and with the left being dominant. Motion artifact limits assessment
of the vertebral artery origins, however there is no evidence of a
significant stenosis or dissection elsewhere on either side.
IMPRESSION: 1. Small acute or subacute white matter infarct in the left frontal
lobe.
2. Severe chronic small vessel ischemic disease with chronic lacunar
infarcts in the cerebrum and cerebellum.
3. No major arterial occlusion or convincing flow limiting proximal
stenosis in the head and neck within limitations of artifact as
detailed above.

## 2021-03-28 IMAGING — CT CT HEAD CODE STROKE
3 series · 15 of 47 positions shown, 18 images · non-contrast
Comparison: [DATE]

CLINICAL DATA: Code stroke. Neuro deficit, acute, stroke suspected
dizzy, unsteady.

EXAM:
CT HEAD WITHOUT CONTRAST
TECHNIQUE: Contiguous axial images were obtained from the base of the skull
through the vertex without intravenous contrast.

[Series 2: head w o · axial · 0.47mm/px · z∈[+74,+200]mm · 9 of 31 slices shown, 12 images]
[im 3/31  brain]
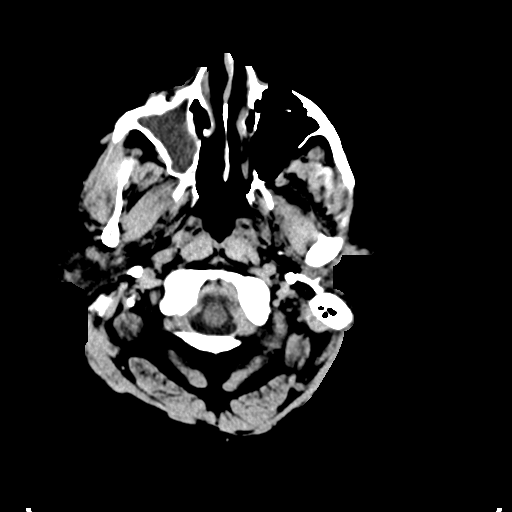
[im 3/31  bone]
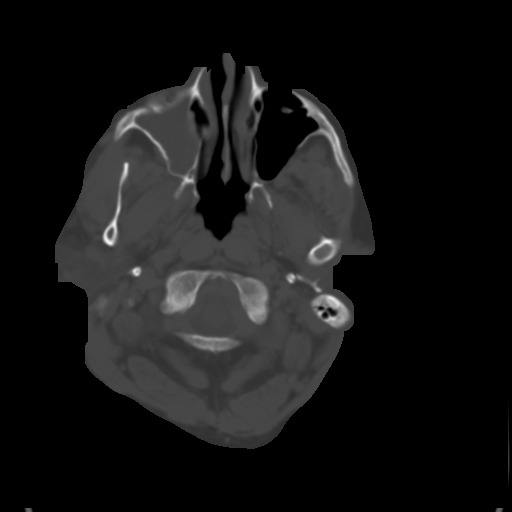
[im 6/31  brain]
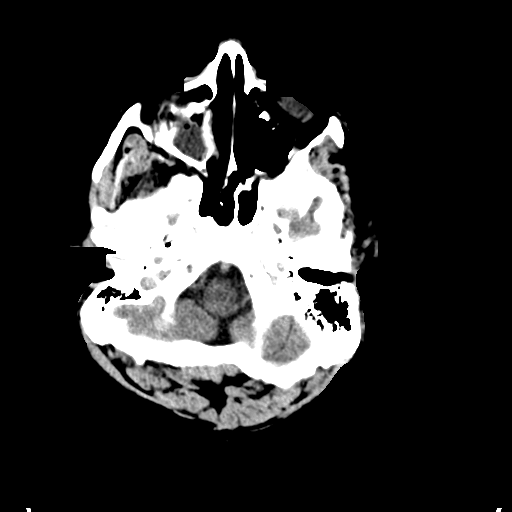
[im 9/31  brain]
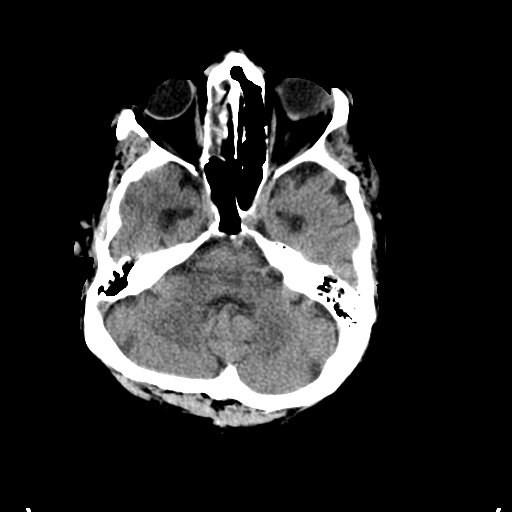
[im 12/31  brain]
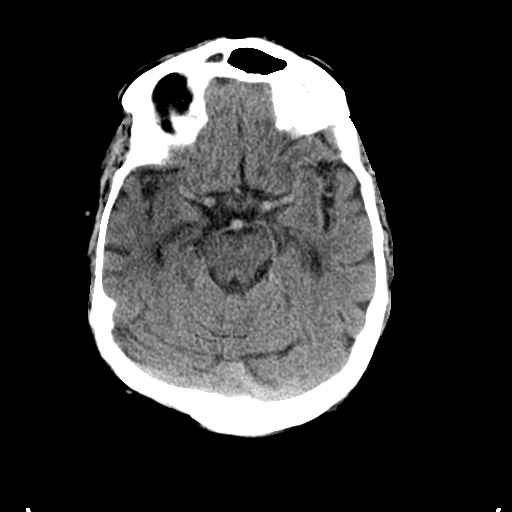
[im 16/31  brain]
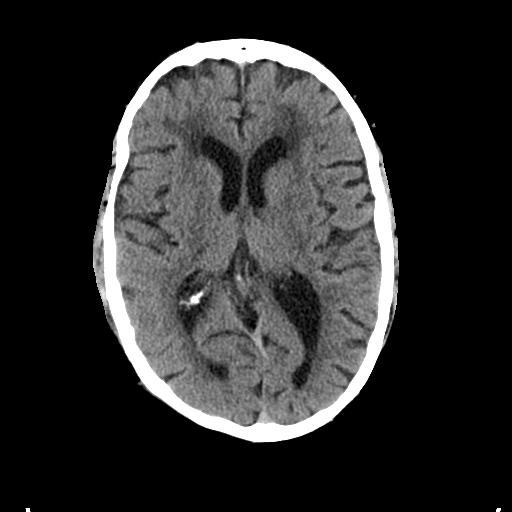
[im 16/31  bone]
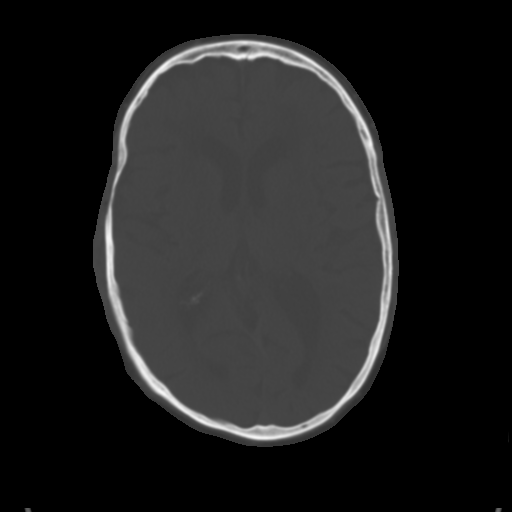
[im 19/31  brain]
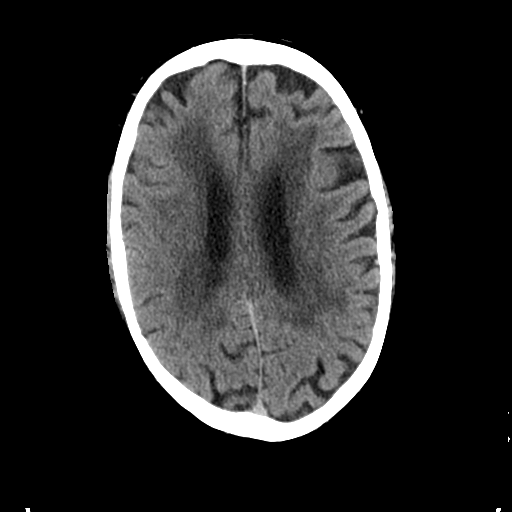
[im 22/31  brain]
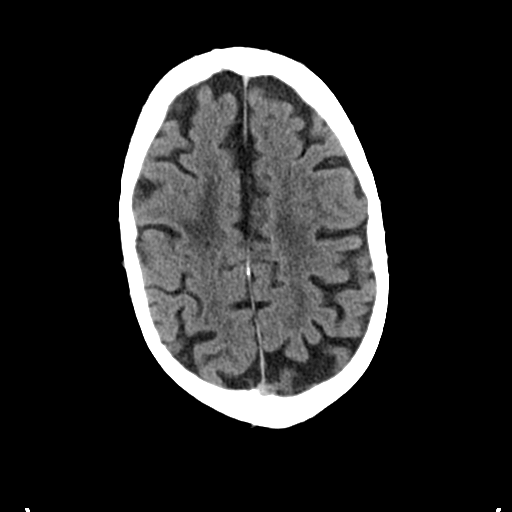
[im 25/31  brain]
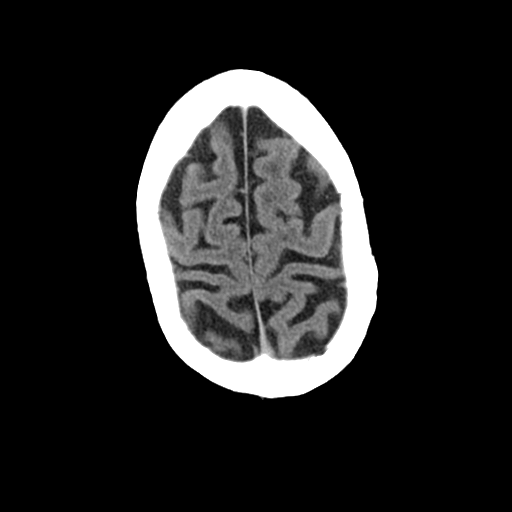
[im 28/31  brain]
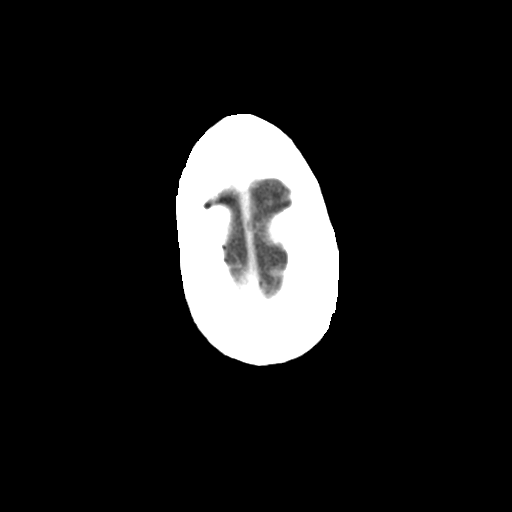
[im 28/31  bone]
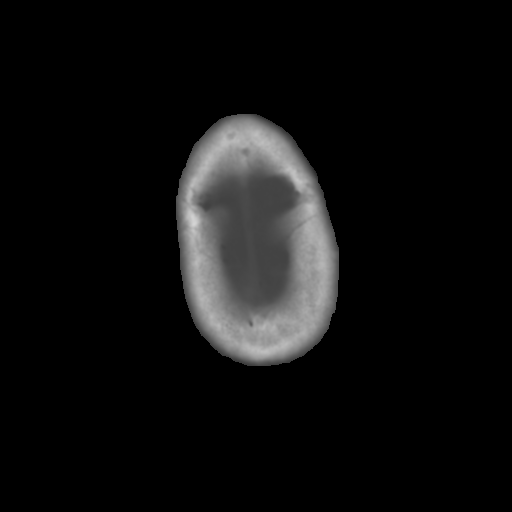

[Series 4: coronal soft · coronal · 0.30mm/px · 3 of 69 slices shown]
[im 23/69  brain]
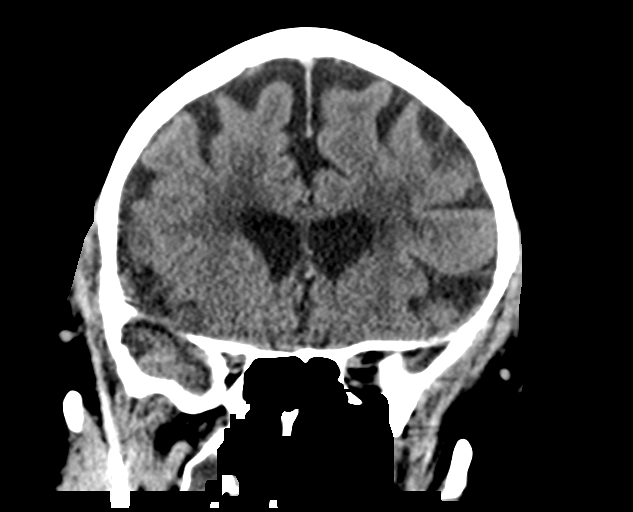
[im 31/69  brain]
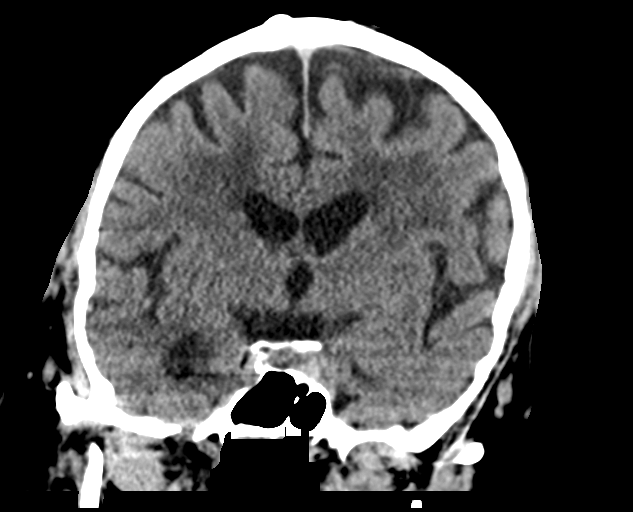
[im 38/69  brain]
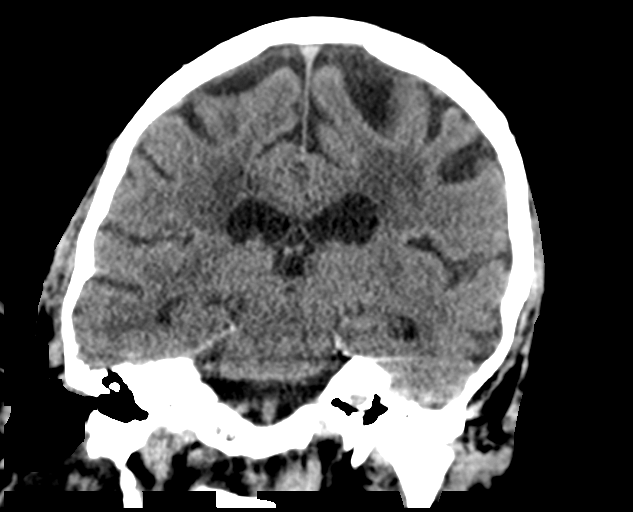

[Series 5: sagittal soft · sagittal · 0.31mm/px · 3 of 52 slices shown]
[im 18/52  brain]
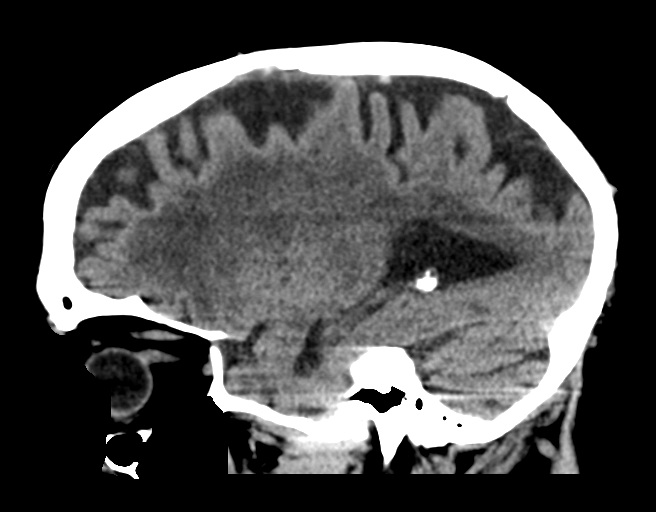
[im 26/52  brain]
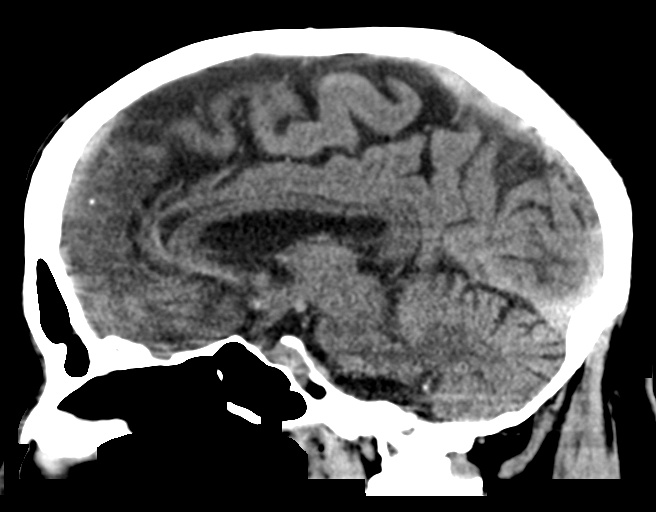
[im 35/52  brain]
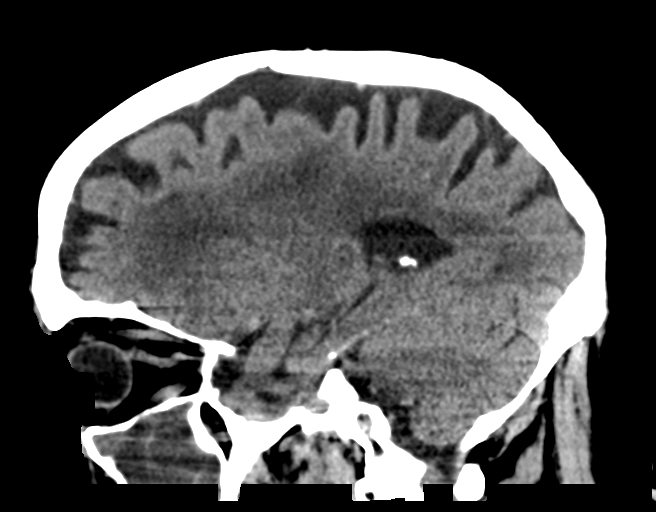

[15 of 47 positions shown; findings below may reference images not displayed]

FINDINGS: Brain: There is no evidence of an acute infarct, intracranial
hemorrhage, mass, midline shift, or extra-axial fluid collection.
Confluent hypodensities in the cerebral white matter bilaterally are
unchanged and nonspecific but compatible with severe chronic small
vessel ischemic disease. There is a chronic lacunar infarct
involving the posterior limb of the right internal capsule and
thalamus. A small chronic infarct inferiorly in the right cerebellar
hemisphere is unchanged. There is mild cerebral atrophy.

Vascular: No suspicious vessel hyperdensity.

Skull: No fracture suspicious osseous lesion.

Sinuses/Orbits: Right middle meatus obstruction pattern of chronic
sinusitis with progressive complete opacification of the right
maxillary, right ethmoid, and right frontal sinuses in the setting
of prior functional endoscopic sinus surgery. Clear mastoid air
cells. Unremarkable orbits.

Other: None.

ASPECTS ([place] Stroke Program Early CT Score)

Not scored with this history.
IMPRESSION: 1. No evidence of acute intracranial abnormality.
2. Severe chronic small vessel ischemic disease.
3. Small chronic cerebellar infarct.

These results were called by telephone at the time of interpretation
acknowledged these results.

## 2021-03-28 IMAGING — MR MR MRA HEAD W/O CM
2 series · 17 of 48 positions shown · non-contrast
Comparison: Head CT [DATE]

CLINICAL DATA: Neuro deficit, acute, stroke suspected. Dizziness.
Numbness in the legs.



[Series 3: ax (id) · axial · 1.0mm · 0.43mm/px · z∈[-76,+4]mm · 16 of 176 slices shown]
[im 1/176]
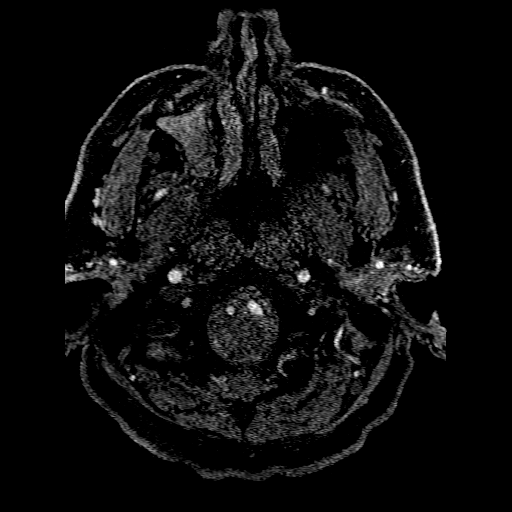
[im 4/176]
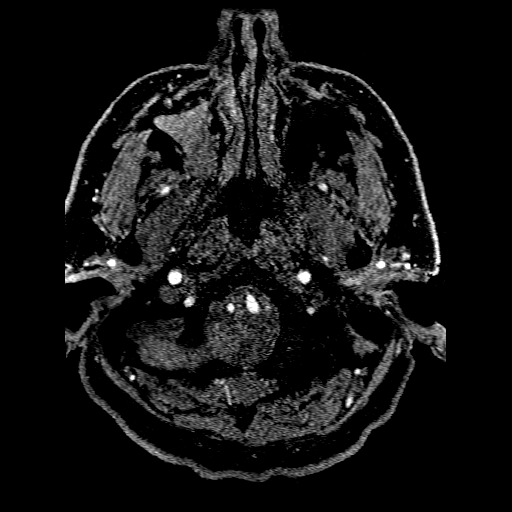
[im 8/176]
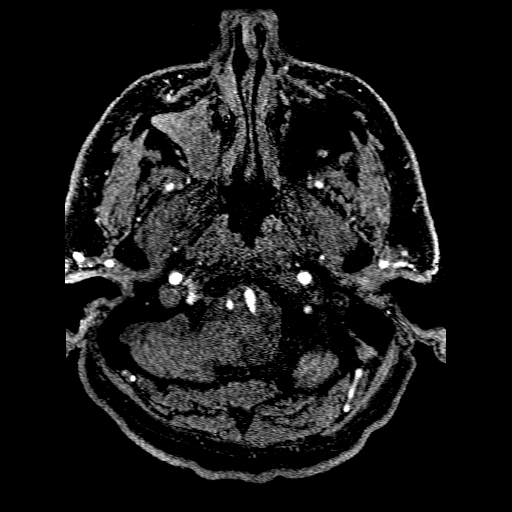
[im 12/176]
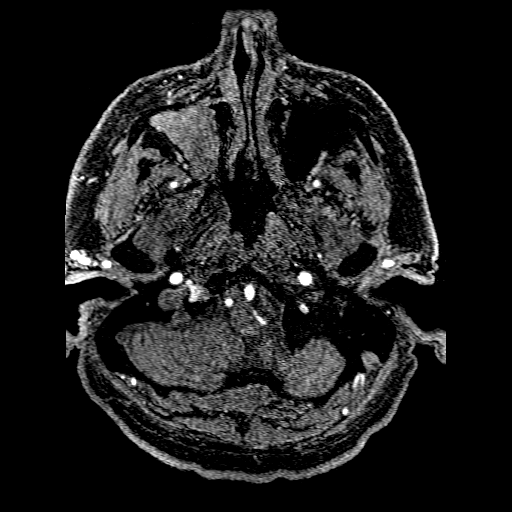
[im 16/176]
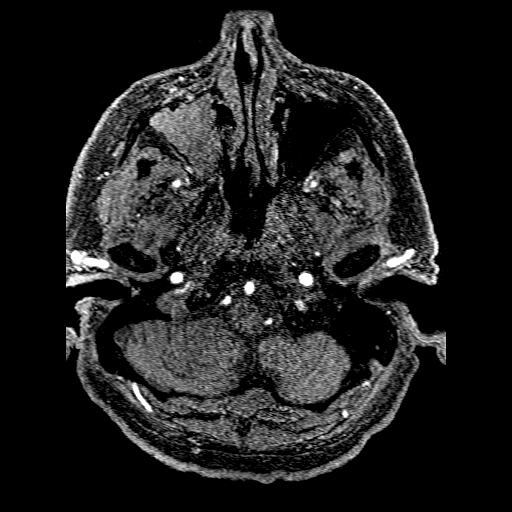
[im 20/176]
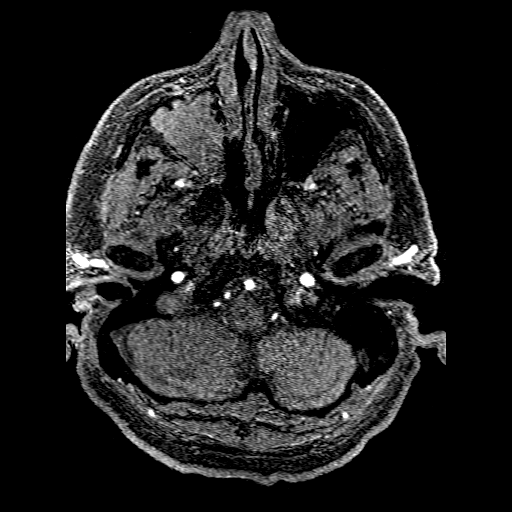
[im 27/176]
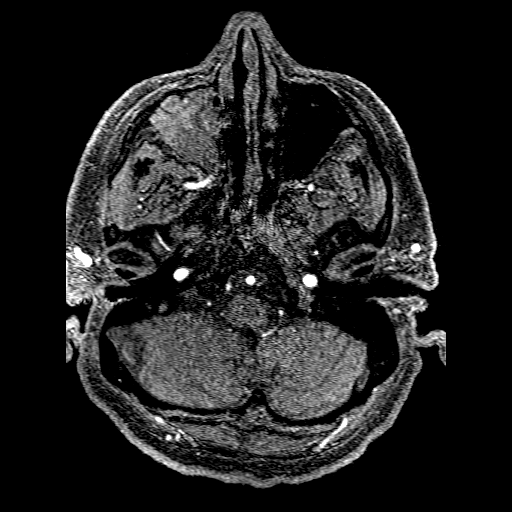
[im 31/176]
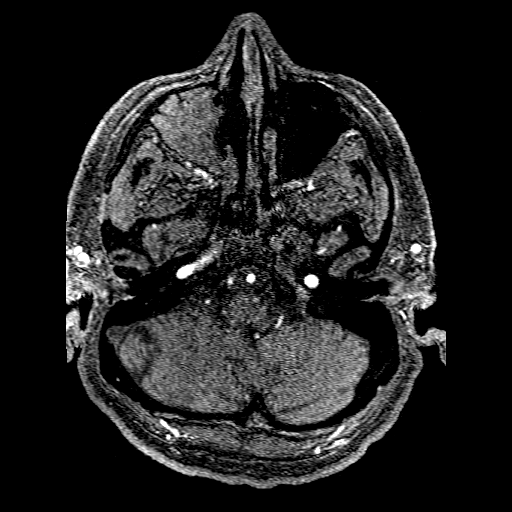
[im 54/176]
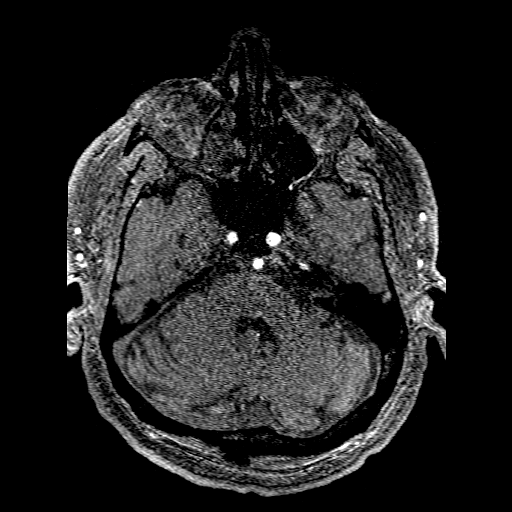
[im 77/176]
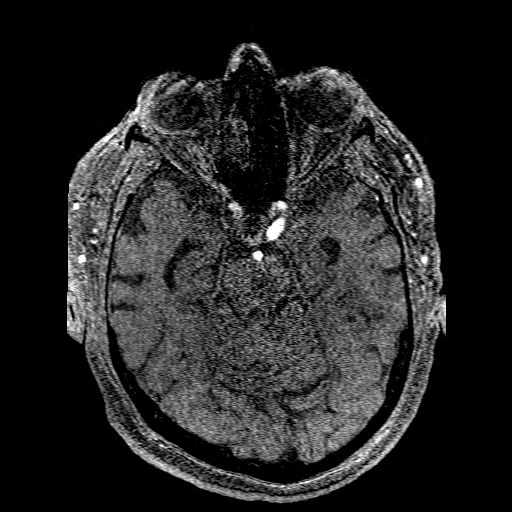
[im 88/176]
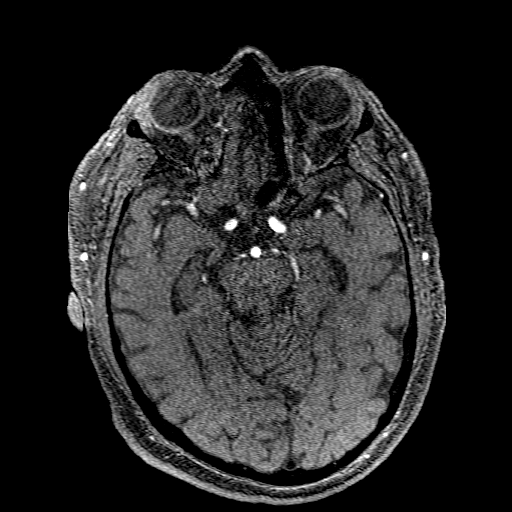
[im 99/176]
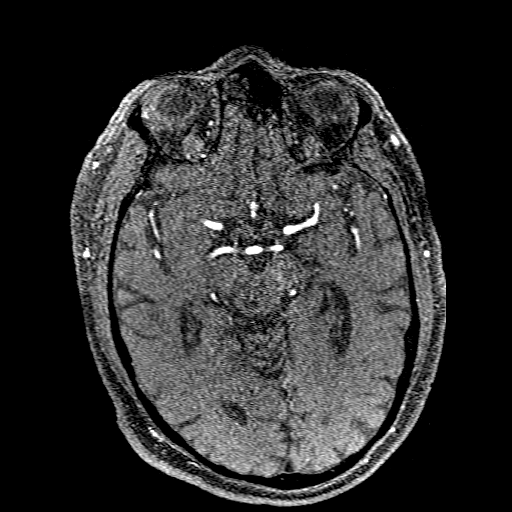
[im 122/176]
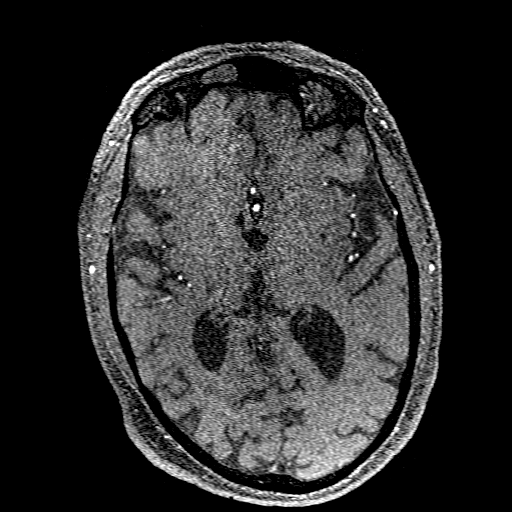
[im 145/176]
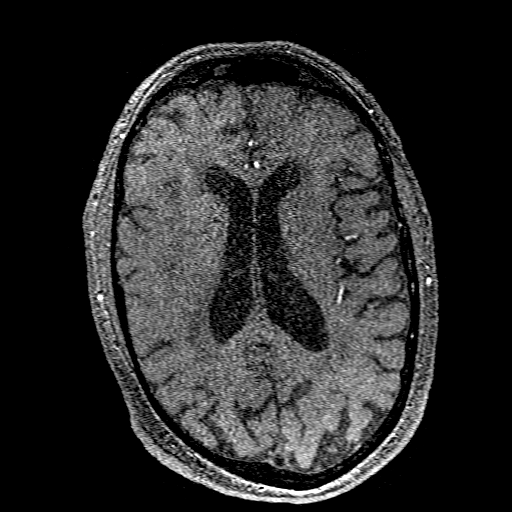
[im 149/176]
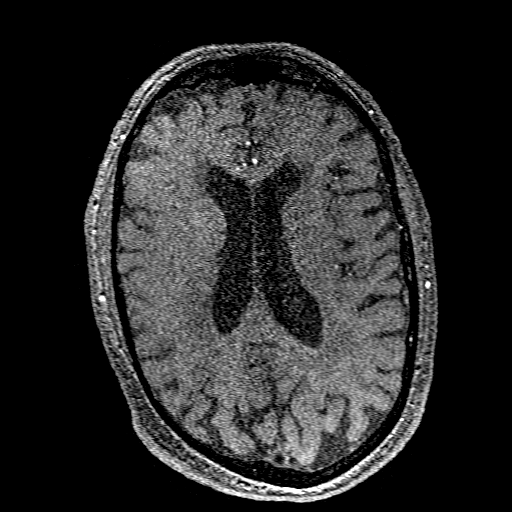
[im 168/176]
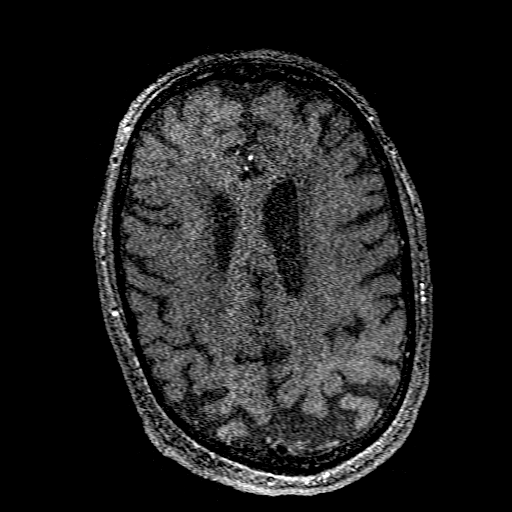

[Series 301: pjn:ax (id) · sagittal · 1.0mm · 0.43mm/px · 1 of 3 slices shown]
[im 1/3]
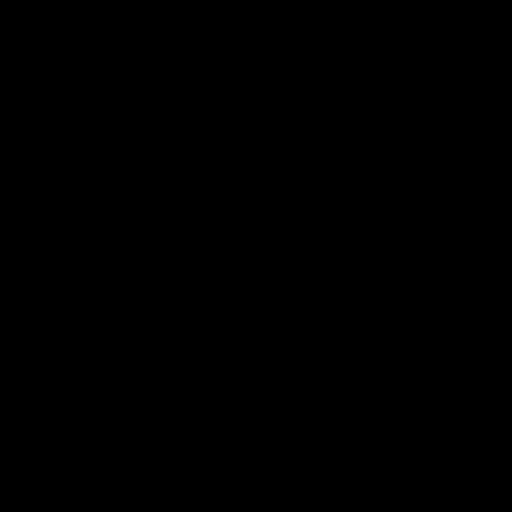

[17 of 48 positions shown; findings below may reference images not displayed]

FINDINGS: MRI HEAD FINDINGS

Brain: There is a small focus of diffusion abnormality involving
subcortical white matter in the high left frontal lobe suggestive of
an acute or subacute small vessel infarct. No acute infarct is
identified elsewhere. There are chronic microhemorrhages scattered
throughout both cerebral hemispheres including in the basal ganglia
as well as in the cerebellum which may be secondary to chronic
hypertension. Confluent T2 hyperintensities in the cerebral white
matter bilaterally are nonspecific but compatible with severe
chronic small vessel ischemic disease. There is a chronic lacunar
infarct involving the right thalamus and posterior limb of the
internal capsule. There is also a small chronic infarct inferiorly
in the right cerebellar hemisphere. There is mild-to-moderate
cerebral atrophy. No mass, midline shift, or extra-axial fluid
collection is identified.

Vascular: Major intracranial vascular flow voids are preserved.

Skull and upper cervical spine: Unremarkable bone marrow signal.

Sinuses/Orbits: Unremarkable orbits. Chronic sinusitis in a right
middle meatus obstruction pattern as previously noted. No
significant mastoid fluid.

Other: None.

MRA HEAD FINDINGS

The included intracranial portions of the vertebral arteries are
patent to the basilar with the left being strongly dominant. Patent
PICA and SCA origins are identified bilaterally. The basilar artery
is widely patent. There is a small right posterior communicating
artery. Both PCAs are patent without evidence of a significant
proximal stenosis.

The internal carotid arteries are patent from skull base to carotid
termini without evidence of a significant stenosis on the left.
Partial signal loss in the right paraclinoid ICA is favored to be
artifactual given normal appearance on the neck MRA. ACAs and MCAs
are patent without evidence of a significant left M1 or left A1
stenosis. Signal loss involving the distal right M1 segment and
bilateral proximal M2 branches is likely artifactual but limits
assessment for a stenosis in these areas. The right A1 segment is
severely hypoplastic. No aneurysm is identified.

MRA NECK FINDINGS

There is a normal variant aortic arch branching pattern with common
origin of the brachiocephalic and left common carotid arteries.
Motion artifact limits assessment of the proximal common carotid
arteries, however the mid and distal common carotid arteries and
cervical internal carotid arteries are widely patent bilaterally
without evidence of a significant stenosis or dissection.

The vertebral arteries are patent with antegrade flow bilaterally
and with the left being dominant. Motion artifact limits assessment
of the vertebral artery origins, however there is no evidence of a
significant stenosis or dissection elsewhere on either side.
IMPRESSION: 1. Small acute or subacute white matter infarct in the left frontal
lobe.
2. Severe chronic small vessel ischemic disease with chronic lacunar
infarcts in the cerebrum and cerebellum.
3. No major arterial occlusion or convincing flow limiting proximal
stenosis in the head and neck within limitations of artifact as
detailed above.

## 2021-03-28 MED ORDER — ACETAMINOPHEN 650 MG RE SUPP: 650.0000 mg | RECTAL | Status: DC | PRN

## 2021-03-28 MED ORDER — PANTOPRAZOLE SODIUM 40 MG PO TBEC
40.0000 mg | DELAYED_RELEASE_TABLET | Freq: Every day | ORAL | Status: DC
  Filled 2021-03-28: qty 1

## 2021-03-28 MED ORDER — ASPIRIN 325 MG PO TABS
325.0000 mg | ORAL_TABLET | Freq: Once | ORAL | Status: AC
  Administered 2021-03-28: 325 mg via ORAL
  Filled 2021-03-28: qty 1

## 2021-03-28 MED ORDER — LORAZEPAM 0.5 MG PO TABS: 0.5000 mg | ORAL_TABLET | Freq: Once | ORAL | Status: DC | PRN

## 2021-03-28 MED ORDER — ADULT MULTIVITAMIN W/MINERALS CH
1.0000 | ORAL_TABLET | Freq: Every day | ORAL | Status: DC
  Administered 2021-03-28 – 2021-03-29 (×2): 1 via ORAL
  Filled 2021-03-28 (×2): qty 1

## 2021-03-28 MED ORDER — ACETAMINOPHEN 325 MG PO TABS
650.0000 mg | ORAL_TABLET | ORAL | Status: DC | PRN
  Administered 2021-03-28 – 2021-03-29 (×2): 650 mg via ORAL
  Filled 2021-03-28 (×2): qty 2

## 2021-03-28 MED ORDER — SENNOSIDES-DOCUSATE SODIUM 8.6-50 MG PO TABS: 1.0000 | ORAL_TABLET | Freq: Every evening | ORAL | Status: DC | PRN

## 2021-03-28 MED ORDER — SODIUM CHLORIDE 0.9 % IV SOLN: INTRAVENOUS | Status: DC

## 2021-03-28 MED ORDER — CLOPIDOGREL BISULFATE 75 MG PO TABS
300.0000 mg | ORAL_TABLET | Freq: Once | ORAL | Status: AC
  Administered 2021-03-28: 300 mg via ORAL

## 2021-03-28 MED ORDER — PROPRANOLOL HCL 80 MG PO TABS
80.0000 mg | ORAL_TABLET | Freq: Every day | ORAL | Status: DC
  Administered 2021-03-29: 80 mg via ORAL
  Filled 2021-03-28: qty 1

## 2021-03-28 MED ORDER — FINASTERIDE 5 MG PO TABS
5.0000 mg | ORAL_TABLET | Freq: Every day | ORAL | Status: DC
  Administered 2021-03-29: 5 mg via ORAL
  Filled 2021-03-28: qty 1

## 2021-03-28 MED ORDER — POTASSIUM CHLORIDE CRYS ER 20 MEQ PO TBCR
40.0000 meq | EXTENDED_RELEASE_TABLET | Freq: Every day | ORAL | Status: DC
  Administered 2021-03-28 – 2021-03-29 (×2): 40 meq via ORAL
  Filled 2021-03-28 (×2): qty 2

## 2021-03-28 MED ORDER — SIMVASTATIN 20 MG PO TABS
40.0000 mg | ORAL_TABLET | Freq: Every day | ORAL | Status: DC
  Administered 2021-03-28: 40 mg via ORAL
  Filled 2021-03-28: qty 2

## 2021-03-28 MED ORDER — TRAMADOL HCL 50 MG PO TABS
50.0000 mg | ORAL_TABLET | Freq: Four times a day (QID) | ORAL | Status: DC | PRN
Start: 2021-03-28 — End: 2021-03-29

## 2021-03-28 MED ORDER — POTASSIUM CHLORIDE IN NACL 20-0.9 MEQ/L-% IV SOLN: INTRAVENOUS | Status: DC

## 2021-03-28 MED ORDER — ENOXAPARIN SODIUM 40 MG/0.4ML IJ SOSY
40.0000 mg | PREFILLED_SYRINGE | INTRAMUSCULAR | Status: DC
  Administered 2021-03-28: 40 mg via SUBCUTANEOUS

## 2021-03-28 MED ORDER — CLOPIDOGREL BISULFATE 75 MG PO TABS
75.0000 mg | ORAL_TABLET | Freq: Every day | ORAL | Status: DC
  Administered 2021-03-29: 75 mg via ORAL
  Filled 2021-03-28: qty 1

## 2021-03-28 MED ORDER — ACETAMINOPHEN 160 MG/5ML PO SOLN: 650.0000 mg | ORAL | Status: DC | PRN

## 2021-03-28 MED ORDER — ASPIRIN EC 81 MG PO TBEC
81.0000 mg | DELAYED_RELEASE_TABLET | Freq: Every day | ORAL | Status: DC
  Administered 2021-03-29: 81 mg via ORAL
  Filled 2021-03-28: qty 1

## 2021-03-28 MED ORDER — INDAPAMIDE 2.5 MG PO TABS
2.5000 mg | ORAL_TABLET | Freq: Every day | ORAL | Status: DC
  Administered 2021-03-29: 2.5 mg via ORAL
  Filled 2021-03-28 (×3): qty 1

## 2021-03-28 MED ORDER — STROKE: EARLY STAGES OF RECOVERY BOOK
Freq: Once | Status: AC
  Filled 2021-03-28 (×2): qty 1

## 2021-03-28 NOTE — Progress Notes (Signed)
PT Cancellation Note  Patient Details Name: KHIZER TOEWS MRN: [id number] DOB: December 07, 1945   Cancelled Treatment:    Reason Eval/Treat Not Completed: Patient at procedure or test/unavailable. Pt on his way to MRI currently. Will plan to follow-up tomorrow.   Moishe Spice, PT, DPT Acute Rehabilitation Services  Pager: [phone] Office: [place] 03/28/2021, 4:18 PM

## 2021-03-28 NOTE — Consult Note (Signed)
[facility] Telemedicine Consult  Requesting Provider: Carin Hock Consult Participants: Patient, bedside nurse Location of the provider: [facility] Location of the patient: [facility]  This consult was provided via telemedicine with 2-way video and audio communication. The patient/family was informed that care would be provided in this way and agreed to receive care in this manner.    Chief Complaint: Dizziness  HPI: 75 year old male with history of previous cerebellar stroke who was in his normal state of health until about 815 this morning when he was going to the bathroom.  He leaned over to sit down and suddenly had intense bout of vertigo/lightheadedness.  The vertigo has improved, however he continues to have some numbness, which he states feels bilateral, right more than left.    LKW: 8:15 AM tpa given?: No, mild symptoms IR Thrombectomy? No, no signs of LVO Modified Rankin Scale: 0-Completely asymptomatic and back to baseline post- stroke Time of teleneurologist evaluation: 11:35 am  Exam: Vitals:   03/28/21 1107 03/28/21 1136  BP: (!) 157/92 (!) 156/86  Pulse: 72 67  Resp: (!) 23 12  Temp: 98.6 F (37 C)   SpO2: 95% 98%    General: In bed, NAD  1A: Level of Consciousness - 0 1B: Ask Month and Age - 0 1C: 'Blink Eyes' & 'Squeeze Hands' - 0 2: Test Horizontal Extraocular Movements - 0 3: Test Visual Fields - 0 4: Test Facial Palsy - 0 5A: Test Left Arm Motor Drift - 0 5B: Test Right Arm Motor Drift - 0 6A: Test Left Leg Motor Drift - 0 6B: Test Right Leg Motor Drift - 0 7: Test Limb Ataxia - 0( Initially questioned mild left arm ataxia, but this was not consistent) 8: Test Sensation - 1(Right arm and leg numbness) 9: Test Language/Aphasia- 0 10: Test Dysarthria - 0 11: Test Extinction/Inattention - 0 NIHSS score: 1  He was able to stand unassisted and take a couple of steps, though limited assessment due to monitors  and environment.  Imaging Reviewed: CT head - negative for acute findings.   Labs reviewed in epic and pertinent values follow: Cr 1.7 INR 1.0 CBC unremarkable   Assessment: 75 year old male with acute disequilibrium, vertigo, and right-sided numbness.  I suspect he has had a mild ischemic infarct.  With a low NIH, I would not typically recommend tPA in the setting.  Given the limited gait assessment I was able to do over the link, I did discuss the possibility of IV tPA with the patient, but after discussion of risks and benefits he agreed with not proceeding.  He will need to be admitted for physical therapy and secondary stroke prevention.   Recommendations:  - HgbA1c, fasting lipid panel - MRI, MRA  of the brain without contrast, MRA neck wo contrast.  - Frequent neuro checks - Echocardiogram - Prophylactic therapy-Antiplatelet med: Aspirin '81mg'$  daily and plavix '75mg'$  daily after '300mg'$  load - Risk factor modification - Telemetry monitoring - PT consult, OT consult, Speech consult - Stroke team to follow    This patient is receiving care for possible acute neurological changes. There was 60 minutes of care by this provider at the time of service, including time for direct evaluation via telemedicine, review of medical records, imaging studies and discussion of findings with providers, the patient and/or family.  Roland Rack, MD [facility] [phone]  If 7pm- 7am, please page neurology on call as listed in [place].

## 2021-03-28 NOTE — Progress Notes (Signed)
CODE STROKE  Call time  [place]   none Started   1124 Ended   1128 Soc   1128 [place] [facility] called  1129

## 2021-03-28 NOTE — ED Notes (Signed)
Dr Melina Copa in room assessing patient.

## 2021-03-28 NOTE — ED Triage Notes (Signed)
Pt c/o dizziness at 1015 this morning, nausea afterwards.  Pt states hx of CVA with same symptoms.  Pt denies any HA.  Numbness to bilateral legs-started with dizziness.  No facial droop noted, no drift in either arm or leg.

## 2021-03-28 NOTE — ED Provider Notes (Signed)
[facility] EMERGENCY DEPARTMENT Provider Note   CSN: [id number] Arrival date & time: 03/28/21  1057     History Chief Complaint  Patient presents with   Dizziness    Austin Bruce is a 75 y.o. male.  He is here for possible strokelike symptoms.  He said at 815 this morning he was in the bathroom when he acutely felt dizzy like the room was spinning and he was unbalanced.  It lasted about 15 minutes and was somewhat improved although he does not feel back to baseline.  He feels like the room is moving.  He does not feel stable when he is ambulating.  He also feels like both of his legs are numb.  No blurry vision double vision focal weakness.  He does feel nauseous.  No headache.  He had a similar episode years ago and he said they diagnosed him with a stroke at that time.  The history is provided by the patient.  Dizziness Quality:  Imbalance and room spinning Severity:  Severe Onset quality:  Sudden Duration:  3 hours Timing:  Constant Progression:  Partially resolved Chronicity:  Recurrent Relieved by:  Nothing Worsened by:  Standing up Ineffective treatments:  Being still Associated symptoms: nausea   Associated symptoms: no blood in stool, no chest pain, no diarrhea, no headaches, no shortness of breath, no vision changes, no vomiting and no weakness   Risk factors: hx of stroke       Past Medical History:  Diagnosis Date   High cholesterol    Hypertension    Stroke ([place])     There are no problems to display for this patient.   Past Surgical History:  Procedure Laterality Date   HAND SURGERY     HERNIA REPAIR     NOSE SURGERY     TONSILLECTOMY         History reviewed. No pertinent family history.  Social History   Tobacco Use   Smoking status: Former    Types: Cigarettes   Smokeless tobacco: Never  Substance Use Topics   Alcohol use: Not Currently   Drug use: Never    Home Medications Prior to Admission medications   Not on File     Allergies    Penicillins  Review of Systems   Review of Systems  Constitutional:  Negative for fever.  HENT:  Negative for sore throat.   Eyes:  Negative for visual disturbance.  Respiratory:  Negative for shortness of breath.   Cardiovascular:  Negative for chest pain.  Gastrointestinal:  Positive for nausea. Negative for abdominal pain, blood in stool, diarrhea and vomiting.  Genitourinary:  Negative for dysuria.  Musculoskeletal:  Negative for neck pain.  Skin:  Negative for rash.  Neurological:  Positive for dizziness and numbness. Negative for weakness and headaches.   Physical Exam Updated Vital Signs BP (!) 157/92 (BP Location: Left Arm)   Pulse 72   Temp 98.6 F (37 C) (Oral)   Resp (!) 23   Ht '5\' 9"'$  (1.753 m)   Wt 98.9 kg   SpO2 95%   BMI 32.19 kg/m   Physical Exam Vitals and nursing note reviewed.  Constitutional:      Appearance: Normal appearance. He is well-developed.  HENT:     Head: Normocephalic and atraumatic.  Eyes:     Conjunctiva/sclera: Conjunctivae normal.  Cardiovascular:     Rate and Rhythm: Normal rate and regular rhythm.     Heart sounds: No murmur heard. Pulmonary:  Effort: Pulmonary effort is normal. No respiratory distress.     Breath sounds: Normal breath sounds.  Abdominal:     Palpations: Abdomen is soft.     Tenderness: There is no abdominal tenderness.  Musculoskeletal:        General: No deformity or signs of injury. Normal range of motion.     Cervical back: Neck supple.  Skin:    General: Skin is warm and dry.  Neurological:     Mental Status: He is alert and oriented to person, place, and time.     Cranial Nerves: No cranial nerve deficit.     Sensory: Sensory deficit present.     Motor: No weakness.     Coordination: Coordination normal.    ED Results / Procedures / Treatments   Labs (all labs ordered are listed, but only abnormal results are displayed) Labs Reviewed  CBC - Abnormal; Notable for the  following components:      Result Value   MCHC 36.1 (*)    All other components within normal limits  DIFFERENTIAL - Abnormal; Notable for the following components:   Monocytes Absolute 1.4 (*)    All other components within normal limits  COMPREHENSIVE METABOLIC PANEL - Abnormal; Notable for the following components:   Sodium 131 (*)    Potassium 3.2 (*)    Chloride 97 (*)    Glucose, Bld 108 (*)    Creatinine, Ser 1.62 (*)    Total Bilirubin 1.4 (*)    GFR, Estimated 44 (*)    All other components within normal limits  URINALYSIS, ROUTINE W REFLEX MICROSCOPIC - Abnormal; Notable for the following components:   Color, Urine STRAW (*)    Specific Gravity, Urine 1.004 (*)    All other components within normal limits  I-STAT CHEM 8, ED - Abnormal; Notable for the following components:   Sodium 134 (*)    Potassium 3.4 (*)    Chloride 95 (*)    Creatinine, Ser 1.70 (*)    Glucose, Bld 107 (*)    All other components within normal limits  CBG MONITORING, ED - Abnormal; Notable for the following components:   Glucose-Capillary 104 (*)    All other components within normal limits  RESP PANEL BY RT-PCR (FLU A&B, COVID) ARPGX2  ETHANOL  PROTIME-INR  APTT  RAPID URINE DRUG SCREEN, HOSP PERFORMED  HEMOGLOBIN A1C    EKG EKG Interpretation  Date/Time:  Saturday March 28 2021 12:59:43 EDT Ventricular Rate:  64 PR Interval:  178 QRS Duration: 99 QT Interval:  435 QTC Calculation: 449 R Axis:   21 Text Interpretation: Sinus rhythm No significant change since prior 4/22 Confirmed by Aletta Edouard [phone]) on 03/28/2021 1:01:49 PM  Radiology CT HEAD CODE STROKE WO CONTRAST  Result Date: 03/28/2021 CLINICAL DATA:  Code stroke. Neuro deficit, acute, stroke suspected dizzy, unsteady. EXAM: CT HEAD WITHOUT CONTRAST TECHNIQUE: Contiguous axial images were obtained from the base of the skull through the vertex without intravenous contrast. COMPARISON:  01/01/2021 FINDINGS: Brain: There  is no evidence of an acute infarct, intracranial hemorrhage, mass, midline shift, or extra-axial fluid collection. Confluent hypodensities in the cerebral white matter bilaterally are unchanged and nonspecific but compatible with severe chronic small vessel ischemic disease. There is a chronic lacunar infarct involving the posterior limb of the right internal capsule and thalamus. A small chronic infarct inferiorly in the right cerebellar hemisphere is unchanged. There is mild cerebral atrophy. Vascular: No suspicious vessel hyperdensity. Skull: No fracture suspicious  osseous lesion. Sinuses/Orbits: Right middle meatus obstruction pattern of chronic sinusitis with progressive complete opacification of the right maxillary, right ethmoid, and right frontal sinuses in the setting of prior functional endoscopic sinus surgery. Clear mastoid air cells. Unremarkable orbits. Other: None. ASPECTS [facility] Stroke Program Early CT Score) Not scored with this history. IMPRESSION: 1. No evidence of acute intracranial abnormality. 2. Severe chronic small vessel ischemic disease. 3. Small chronic cerebellar infarct. These results were called by telephone at the time of interpretation on 03/28/2021 at 11:40 am to Dr. Aletta Edouard, who verbally acknowledged these results. Electronically Signed   By: Logan Bores M.D.   On: 03/28/2021 11:44    Procedures .Critical Care  Date/Time: 03/28/2021 4:57 PM Performed by: Hayden Rasmussen, MD Authorized by: Hayden Rasmussen, MD   Critical care provider statement:    Critical care time (minutes):  45   Critical care time was exclusive of:  Separately billable procedures and treating other patients   Critical care was necessary to treat or prevent imminent or life-threatening deterioration of the following conditions:  CNS failure or compromise   Critical care was time spent personally by me on the following activities:  Discussions with consultants, evaluation of patient's  response to treatment, examination of patient, ordering and performing treatments and interventions, ordering and review of laboratory studies, ordering and review of radiographic studies, pulse oximetry, re-evaluation of patient's condition, obtaining history from patient or surrogate, review of old charts and development of treatment plan with patient or surrogate   Medications Ordered in ED Medications   stroke: mapping our early stages of recovery book (0 each Does not apply Hold 03/28/21 1438)  acetaminophen (TYLENOL) tablet 650 mg (has no administration in time range)    Or  acetaminophen (TYLENOL) 160 MG/5ML solution 650 mg (has no administration in time range)    Or  acetaminophen (TYLENOL) suppository 650 mg (has no administration in time range)  senna-docusate (Senokot-S) tablet 1 tablet (has no administration in time range)  enoxaparin (LOVENOX) injection 40 mg (has no administration in time range)  aspirin EC tablet 81 mg (has no administration in time range)  clopidogrel (PLAVIX) tablet 75 mg (has no administration in time range)  pantoprazole (PROTONIX) EC tablet 40 mg (has no administration in time range)  LORazepam (ATIVAN) tablet 0.5 mg (has no administration in time range)  finasteride (PROSCAR) tablet 5 mg (has no administration in time range)  indapamide (LOZOL) tablet 2.5 mg (has no administration in time range)  multivitamin with minerals tablet 1 tablet (has no administration in time range)  potassium chloride SA (KLOR-CON) CR tablet 40 mEq (has no administration in time range)  propranolol (INDERAL) tablet 80 mg (has no administration in time range)  simvastatin (ZOCOR) tablet 40 mg (has no administration in time range)  traMADol (ULTRAM) tablet 50 mg (has no administration in time range)  0.9 %  sodium chloride infusion (has no administration in time range)  clopidogrel (PLAVIX) tablet 300 mg (300 mg Oral Given 03/28/21 1212)  aspirin tablet 325 mg (325 mg Oral Given  03/28/21 1212)    ED Course  I have reviewed the triage vital signs and the nursing notes.  Pertinent labs & imaging results that were available during my care of the patient were reviewed by me and considered in my medical decision making (see chart for details).  Clinical Course as of 03/28/21 1657  Sat Mar 28, 2021  1147 Received a call from radiology that patient has no  acute finding on head CT.  Does have some white matter changes and old cerebellar stroke.  Code stroke is ongoing now with consult by Dr. Leonel Ramsay [MB]  1159 Discussed with Dr. Leonel Ramsay neuro hospitalist.  He feels this was likely an ischemic event and will need MRI imaging.  He is recommending Plavix load with 300 and along with 325 aspirin.  Will need admission to [place] for MRA.  Neurology will follow along. [MB]  1159 Discussed with Dr. Wynetta Emery from [facility] who will evaluate the patient for admission. [MB]    Clinical Course User Index [MB] Hayden Rasmussen, MD   MDM Rules/Calculators/A&P                          This patient complains of acute onset of dizziness unsteadiness and lower extremity numbness; this involves an extensive number of treatment Options and is a complaint that carries with it a high risk of complications and Morbidity. The differential includes stroke, vertigo, bleed, hypertensive emergency, metabolic derangement  I ordered, reviewed and interpreted labs, which included CBC with normal white count normal hemoglobin, chemistries with mildly low sodium and potassium and chloride, elevated creatinine similar to priors, coags normal I ordered medication oral Plavix and aspirin I ordered imaging studies which included CT head and I independently    visualized and interpreted imaging which showed old occipital infarct Additional history obtained from patient's wife Previous records obtained and reviewed in epic no recent admissions. I consulted neuro hospitalist Dr.  Leonel Ramsay and [facility] Dr. Wynetta Emery and discussed lab and imaging findings  Critical Interventions: Evaluation and management of patient's acute neurologic condition including consideration for tPA.  After the interventions stated above, I reevaluated the patient and found patient still to be symptomatic although he feels better than at symptom onset.  Neurology is not recommending tPA due to low severity of deficits.  They do feel he needs admission to the hospital for MRI and other stroke work-up.  Patient agreeable to plan.   Final Clinical Impression(s) / ED Diagnoses Final diagnoses:  Dizziness    Rx / DC Orders ED Discharge Orders     None        Hayden Rasmussen, MD 03/28/21 1700

## 2021-03-28 NOTE — H&P (Signed)
History and Physical  [facility]  Austin Bruce [id number] DOB: 16-Apr-1946 DOA: 03/28/2021  PCP: [facility]  Patient coming from: Home  Level of care: Telemetry Medical  I have personally briefly reviewed patient's old medical records in [place]  Chief Complaint: numbness in legs, dizziness   HPI: Austin Bruce is a 75 y.o. male with medical history significant for TIA in 2017 with negative MRI and MRA at that time he was treated at [facility].  He had subsequently been treated with aspirin 81 mg daily for secondary prevention however developed severe GI bleeding and had to cut the aspirin back to twice weekly.  He has HTN, hyperlipidemia and vertigo with age related hearing loss.  He woke up feeling normal this morning and he had breakfast with his wife.  He said he subsequently went to the bathroom and started having vertigo symptoms where he felt like the entire room was spinning.  He started having nausea.  He also had numbness to both legs associated with the dizziness.  He had no changes in speech or vision and no facial drooping however he reported that he did not feel normal.  His wife was concerned and brought him to the emergency department for further evaluation.  He initially had a CT scan of the head without contrast with no findings of acute CVA but there is chronic cerebellar infarction noted.  He also has chronic ischemic vascular changes.  Telemetry neurology was consulted as well.  There was concern that patient was having mild ischemic episode and MRI and MRA was recommended.  Patient was loaded with aspirin and Plavix and admission to [facility] was requested.  Neurology discussed tPA with the patient and wife and the decision was made to not pursue tPA at this time.  Review of Systems: Review of Systems  Constitutional:  Negative for diaphoresis.  HENT:  Positive for hearing loss and tinnitus. Negative for congestion, ear discharge, ear pain,  nosebleeds and sinus pain.   Eyes:  Negative for blurred vision, double vision, pain and discharge.  Respiratory:  Negative for cough, hemoptysis, sputum production and stridor.   Cardiovascular:  Negative for chest pain, palpitations and orthopnea.  Gastrointestinal:  Positive for blood in stool.  Genitourinary: Negative.   Musculoskeletal: Negative.   Skin: Negative.   Neurological:  Positive for dizziness, tingling and sensory change. Negative for speech change, focal weakness, seizures, loss of consciousness, weakness and headaches.  Psychiatric/Behavioral: Negative.    All other systems reviewed and are negative.   Past Medical History:  Diagnosis Date   High cholesterol    Hypertension    Stroke [facility])     Past Surgical History:  Procedure Laterality Date   HAND SURGERY     HERNIA REPAIR     NOSE SURGERY     TONSILLECTOMY       reports that he has quit smoking. His smoking use included cigarettes. He has never used smokeless tobacco. He reports previous alcohol use. He reports that he does not use drugs.  Allergies  Allergen Reactions   Penicillins Anaphylaxis   History reviewed. No pertinent family history.  Prior to Admission medications   Medication Sig Start Date End Date Taking? Authorizing Provider  amLODipine (NORVASC) 10 MG tablet Take 10 mg by mouth daily.   Yes [provider]  finasteride (PROSCAR) 5 MG tablet Take 5 mg by mouth daily.   Yes [provider]  indapamide (LOZOL) 2.5 MG  tablet Take 2.5 mg by mouth daily.   Yes [provider]  lisinopril (ZESTRIL) 40 MG tablet Take 40 mg by mouth daily.   Yes [provider]  propranolol (INDERAL) 80 MG tablet Take 80 mg by mouth daily.   Yes [provider]  simvastatin (ZOCOR) 40 MG tablet Take 40 mg by mouth daily.   Yes [provider]   Physical Exam: Vitals:   03/28/21 1100 03/28/21 1107 03/28/21 1136 03/28/21 1145  BP:  (!) 157/92 (!) 156/86 (!)  155/95  Pulse:  72 67 70  Resp:  (!) 23 12 (!) 21  Temp:  98.6 F (37 C)    TempSrc:  Oral    SpO2:  95% 98% 96%  Weight: 98.9 kg     Height: '5\' 9"'$  (1.753 m)      Constitutional: NAD, calm, comfortable. Sitting up in bed. His speech is clear, vision seems appropriate by gross examination. Eyes: PERRL, lids and conjunctivae normal ENMT: Mucous membranes are moist. Posterior pharynx clear of any exudate or lesions.Normal dentition.  Neck: normal, supple, no masses, no thyromegaly Respiratory: clear to auscultation bilaterally, no wheezing, no crackles. Normal respiratory effort. No accessory muscle use.  Cardiovascular: normal s1, s2 sounds, no murmurs / rubs / gallops. No extremity edema. 2+ pedal pulses. No carotid bruits.  Abdomen: no tenderness, no masses palpated. No hepatosplenomegaly. Bowel sounds positive.  Musculoskeletal: no clubbing / cyanosis. No joint deformity upper and lower extremities. Good ROM, no contractures. Normal muscle tone.  Skin: no rashes, lesions, ulcers. No induration Neurologic: CN 2-12 grossly intact. Sensation intact, DTR normal. Strength 5/5 in all 4.  Psychiatric: Normal judgment and insight. Alert and oriented x 3. Normal mood.   Labs on Admission: I have personally reviewed following labs and imaging studies  CBC: Recent Labs  Lab 03/28/21 1128 03/28/21 1129  WBC 9.7  --   NEUTROABS 6.3  --   HGB 16.1 16.0  HCT 44.6 47.0  MCV 91.8  --   PLT 242  --    Basic Metabolic Panel: Recent Labs  Lab 03/28/21 1128 03/28/21 1129  NA 131* 134*  K 3.2* 3.4*  CL 97* 95*  CO2 26  --   GLUCOSE 108* 107*  BUN 15 15  CREATININE 1.62* 1.70*  CALCIUM 9.1  --    GFR: Estimated Creatinine Clearance: 44.2 mL/min (A) (by C-G formula based on SCr of 1.7 mg/dL (H)). Liver Function Tests: Recent Labs  Lab 03/28/21 1128  AST 26  ALT 39  ALKPHOS 58  BILITOT 1.4*  PROT 7.5  ALBUMIN 4.0   No results for input(s): LIPASE, AMYLASE in the last 168  hours. No results for input(s): AMMONIA in the last 168 hours. Coagulation Profile: Recent Labs  Lab 03/28/21 1128  INR 1.0   Cardiac Enzymes: No results for input(s): CKTOTAL, CKMB, CKMBINDEX, TROPONINI in the last 168 hours. BNP (last 3 results) No results for input(s): PROBNP in the last 8760 hours. HbA1C: No results for input(s): HGBA1C in the last 72 hours. CBG: Recent Labs  Lab 03/28/21 1133  GLUCAP 104*   Lipid Profile: No results for input(s): CHOL, HDL, LDLCALC, TRIG, CHOLHDL, LDLDIRECT in the last 72 hours. Thyroid Function Tests: No results for input(s): TSH, T4TOTAL, FREET4, T3FREE, THYROIDAB in the last 72 hours. Anemia Panel: No results for input(s): VITAMINB12, FOLATE, FERRITIN, TIBC, IRON, RETICCTPCT in the last 72 hours. Urine analysis:    Component Value Date/Time   COLORURINE STRAW (A) 03/28/2021  [place] 03/28/2021 1210   LABSPEC 1.004 (L) 03/28/2021 1210   PHURINE 7.0 03/28/2021 1210   GLUCOSEU NEGATIVE 03/28/2021 1210   HGBUR NEGATIVE 03/28/2021 1210   BILIRUBINUR NEGATIVE 03/28/2021 1210   KETONESUR NEGATIVE 03/28/2021 1210   PROTEINUR NEGATIVE 03/28/2021 1210   NITRITE NEGATIVE 03/28/2021 1210   LEUKOCYTESUR NEGATIVE 03/28/2021 1210    Radiological Exams on Admission: CT HEAD CODE STROKE WO CONTRAST  Result Date: 03/28/2021 CLINICAL DATA:  Code stroke. Neuro deficit, acute, stroke suspected dizzy, unsteady. EXAM: CT HEAD WITHOUT CONTRAST TECHNIQUE: Contiguous axial images were obtained from the base of the skull through the vertex without intravenous contrast. COMPARISON:  01/01/2021 FINDINGS: Brain: There is no evidence of an acute infarct, intracranial hemorrhage, mass, midline shift, or extra-axial fluid collection. Confluent hypodensities in the cerebral white matter bilaterally are unchanged and nonspecific but compatible with severe chronic small vessel ischemic disease. There is a chronic lacunar infarct involving the  posterior limb of the right internal capsule and thalamus. A small chronic infarct inferiorly in the right cerebellar hemisphere is unchanged. There is mild cerebral atrophy. Vascular: No suspicious vessel hyperdensity. Skull: No fracture suspicious osseous lesion. Sinuses/Orbits: Right middle meatus obstruction pattern of chronic sinusitis with progressive complete opacification of the right maxillary, right ethmoid, and right frontal sinuses in the setting of prior functional endoscopic sinus surgery. Clear mastoid air cells. Unremarkable orbits. Other: None. ASPECTS [facility] Stroke Program Early CT Score) Not scored with this history. IMPRESSION: 1. No evidence of acute intracranial abnormality. 2. Severe chronic small vessel ischemic disease. 3. Small chronic cerebellar infarct. These results were called by telephone at the time of interpretation on 03/28/2021 at 11:40 am to Dr. Aletta Edouard, who verbally acknowledged these results. Electronically Signed   By: Logan Bores M.D.   On: 03/28/2021 11:44    EKG: Independently reviewed. Pending   Assessment/Plan Principal Problem:   Acute CVA (cerebrovascular accident) ([place]) Active Problems:   Bilateral leg numbness   History of CVA (cerebrovascular accident)   Former smoker   Hypertension   High cholesterol   Vertigo   History of GI bleed   Hypokalemia   CKD stage G3b/A1, GFR 30-44 and albumin creatinine ratio <30 mg/g ([place])   Working diagnosis of acute CVA - appreciate neurology consultation and recommendations.  Pt has been loaded with aspirin and plavix.  He is being admitted to [facility] hospital for MRI brain and MRA which is not available at [facility] at this time.  Neurology consult. Complete full CVA work up with a 2D echocardiogram and carotid doppler study.  Check A1c, fasting lipid panel and obtain PT/OT evaluation.  SLP eval if fails the swallow screen,  Provide gentle IV fluid.  Allow permissive hypertension.    Essential hypertension - allow  permissive hypertension in setting of acute CVA.   Hypokalemia - Replete and recheck in AM.   Hyperlipidemia - check fasting lipid panel.    Stage 3b CKD - stable, following, renally dose medications and gentle IV fluid hydration.   History of GI bleed - Pt did not tolerate aspirin 81 mg daily due to severe GI bleeding.  She had been placed on aspirin twice weekly.  Now with new CVA will defer to neurology team regarding risks vs benefits of more aggressive anticoagulation for secondary prevention.    DVT prophylaxis: enoxaparin   Code Status: Full   Family Communication: wife at bedside   Disposition Plan: anticipating home   Consults called: neurology  Admission status: OBV  Level of care: [place] MD [facility] How to contact the [facility] Attending or Consulting provider [place] or covering provider during after hours [place], for this patient?  Check the care team in [facility] and look for a) attending/consulting [facility] provider listed and b) the [facility] team listed Log into [email or web address] and use [place]'s universal password to access. If you do not have the password, please contact the hospital operator. Locate the [facility] provider you are looking for under [facility] and page to a number that you can be directly reached. If you still have difficulty reaching the provider, please page the [facility] (Director on Call) for the Hospitalists listed on amion for assistance.   If 7PM-7AM, please contact night-coverage [email or web address] Password [facility]  03/28/2021, 12:41 PM

## 2021-03-28 NOTE — Progress Notes (Signed)
Pt off floor at MRI at 16:00 until 17:20.

## 2021-03-28 NOTE — ED Notes (Signed)
[facility] here to transport patient. Wife made aware of room location at [facility] and that she would not be allowed to ride in [place] with patient.

## 2021-03-29 ENCOUNTER — Other Ambulatory Visit: Payer: Self-pay | Admitting: Medical

## 2021-03-29 ENCOUNTER — Observation Stay (HOSPITAL_BASED_OUTPATIENT_CLINIC_OR_DEPARTMENT_OTHER): Payer: Medicare Other

## 2021-03-29 DIAGNOSIS — I6389 Other cerebral infarction: Secondary | ICD-10-CM

## 2021-03-29 DIAGNOSIS — N1832 Chronic kidney disease, stage 3b: Secondary | ICD-10-CM | POA: Diagnosis not present

## 2021-03-29 DIAGNOSIS — H8110 Benign paroxysmal vertigo, unspecified ear: Secondary | ICD-10-CM

## 2021-03-29 DIAGNOSIS — I639 Cerebral infarction, unspecified: Secondary | ICD-10-CM

## 2021-03-29 LAB — CBC WITH DIFFERENTIAL/PLATELET
Abs Immature Granulocytes: 0.05 10*3/uL (ref 0.00–0.07)
Basophils Absolute: 0.1 10*3/uL (ref 0.0–0.1)
Basophils Relative: 1 %
Eosinophils Absolute: 0.3 10*3/uL (ref 0.0–0.5)
Eosinophils Relative: 4 %
HCT: 44.5 % (ref 39.0–52.0)
Hemoglobin: 16.3 g/dL (ref 13.0–17.0)
Immature Granulocytes: 1 %
Lymphocytes Relative: 28 %
Lymphs Abs: 2.3 10*3/uL (ref 0.7–4.0)
MCH: 32.9 pg (ref 26.0–34.0)
MCHC: 36.6 g/dL — ABNORMAL HIGH (ref 30.0–36.0)
MCV: 89.7 fL (ref 80.0–100.0)
Monocytes Absolute: 1.3 10*3/uL — ABNORMAL HIGH (ref 0.1–1.0)
Monocytes Relative: 15 %
Neutro Abs: 4.4 10*3/uL (ref 1.7–7.7)
Neutrophils Relative %: 51 %
Platelets: 239 10*3/uL (ref 150–400)
RBC: 4.96 MIL/uL (ref 4.22–5.81)
RDW: 13.2 % (ref 11.5–15.5)
WBC: 8.4 10*3/uL (ref 4.0–10.5)
nRBC: 0 % (ref 0.0–0.2)

## 2021-03-29 LAB — ECHOCARDIOGRAM COMPLETE
AR max vel: 2.66 cm2
AV Area VTI: 2.43 cm2
AV Area mean vel: 2.42 cm2
AV Mean grad: 5 mmHg
AV Peak grad: 8.6 mmHg
Ao pk vel: 1.47 m/s
Area-P 1/2: 3.42 cm2
Height: 69 in
S' Lateral: 2.9 cm
Weight: 3488 oz

## 2021-03-29 LAB — BASIC METABOLIC PANEL
Anion gap: 11 (ref 5–15)
BUN: 14 mg/dL (ref 8–23)
CO2: 25 mmol/L (ref 22–32)
Calcium: 9.6 mg/dL (ref 8.9–10.3)
Chloride: 100 mmol/L (ref 98–111)
Creatinine, Ser: 1.29 mg/dL — ABNORMAL HIGH (ref 0.61–1.24)
GFR, Estimated: 58 mL/min — ABNORMAL LOW (ref >60)
Glucose, Bld: 103 mg/dL — ABNORMAL HIGH (ref 70–99)
Potassium: 3.2 mmol/L — ABNORMAL LOW (ref 3.5–5.1)
Sodium: 136 mmol/L (ref 135–145)

## 2021-03-29 LAB — MAGNESIUM: Magnesium: 1.8 mg/dL (ref 1.7–2.4)

## 2021-03-29 LAB — LIPID PANEL
Cholesterol: 125 mg/dL (ref 0–200)
HDL: 31 mg/dL — ABNORMAL LOW (ref >40)
LDL Cholesterol: 50 mg/dL (ref 0–99)
Total CHOL/HDL Ratio: 4 RATIO
Triglycerides: 218 mg/dL — ABNORMAL HIGH (ref <150)
VLDL: 44 mg/dL — ABNORMAL HIGH (ref 0–40)

## 2021-03-29 MED ORDER — CLOPIDOGREL BISULFATE 75 MG PO TABS: 75.0000 mg | ORAL_TABLET | Freq: Every day | ORAL | 2 refills | Status: DC

## 2021-03-29 NOTE — Evaluation (Signed)
Speech Language Pathology Evaluation Patient Details Name: Austin Bruce MRN: [id number] DOB: 05-03-1946 Today's Date: 03/29/2021 Time: 1100-1120 SLP Time Calculation (min) (ACUTE ONLY): 20 min  Problem List:  Patient Active Problem List   Diagnosis Date Noted   Acute CVA (cerebrovascular accident) ([place]) 03/28/2021   History of CVA (cerebrovascular accident) 03/28/2021   Former smoker 03/28/2021   Hypertension    High cholesterol    Vertigo    Bilateral leg numbness    History of GI bleed    Hypokalemia    CKD stage G3b/A1, GFR 30-44 and albumin creatinine ratio <30 mg/g ([facility])    Past Medical History:  Past Medical History:  Diagnosis Date   High cholesterol    Hypertension    Stroke [facility])    Past Surgical History:  Past Surgical History:  Procedure Laterality Date   HAND SURGERY     HERNIA REPAIR     NOSE SURGERY     TONSILLECTOMY     HPI:  75 yo male presented 03/28/21 with dizziness and BIL LE numbness and nausea. MRI revealed acute L frontal Lobe infarct and chronic lacunar  infarcts in the cerebrum and cerebellum. PMH CVA high cholesterol HTN   Assessment / Plan / Recommendation Clinical Impression  Pt was seen for a cognitive-linguistic evaluation in the setting of acute L frontal lobe infarct.  Pt was encountered awake/alert with wife present at bedside and he was agreeable to this evaluation.  Pt reported that he is independent with IADLs at home including medication and financial management.  Pt completed the [place] in addition to informal measures and he scored overall 27/30, indicating normal cognitive-linguistic abilities.  Mild short-term memory deficits were observed; however, pt reported that this was baseline and he independently reported strategies that he utilizes at home.  Expressive and receptive language were functional and no dysarthria was observed.  No further skilled ST is warranted at this time.  Please re-consult if additional needs arise.    SLP  Assessment  SLP Recommendation/Assessment: Patient does not need any further Speech Lanaguage Pathology Services SLP Visit Diagnosis: Cognitive communication deficit (R41.841)    Follow Up Recommendations  None          SLP Evaluation Cognition  Overall Cognitive Status: Within Functional Limits for tasks assessed Arousal/Alertness: Awake/alert Orientation Level: Oriented X4 Attention: Focused;Sustained Focused Attention: Appears intact Sustained Attention: Impaired Sustained Attention Impairment: Verbal complex Memory: Impaired Memory Impairment: Decreased short term memory Decreased Short Term Memory: Verbal basic Awareness: Appears intact Problem Solving: Appears intact Safety/Judgment: Appears intact       Comprehension  Auditory Comprehension Overall Auditory Comprehension: Appears within functional limits for tasks assessed    Expression Expression Primary Mode of Expression: Verbal Verbal Expression Overall Verbal Expression: Appears within functional limits for tasks assessed Written Expression Dominant Hand: Right   Oral / Motor  Oral Motor/Sensory Function Overall Oral Motor/Sensory Function: Within functional limits Motor Speech Overall Motor Speech: Appears within functional limits for tasks assessed   GO                   Colin Mulders., M.S., [place] Office: [phone]  Elvia Collum [facility] 03/29/2021, 11:24 AM

## 2021-03-29 NOTE — Evaluation (Signed)
Physical Therapy Evaluation Patient Details Name: Austin Bruce MRN: [id number] DOB: 1946-05-27 Today's Date: 03/29/2021   History of Present Illness  75 yo male presented 03/28/21 with dizziness and BIL LE numbness and nausea. MRI revealed acute L frontal Lobe infarct and chronic lacunar  infarcts in the cerebrum and cerebellum. PMH CVA high cholestrol HTN   Clinical Impression  Pt presents with condition above and deficits mentioned below, see PT Problem List. PTA, he was independent, living with his wife in a house with 3 STE with a handrail. Currently, pt displays deficits in bil lower extremity sensation to light touch (R with less sensation than L), incoordination in bil lower extremities, and bouts of slight dizziness when changing head positions when ambulating (tested negative with bil Marye Round though). These deficits do place the pt at risk for falls, supported by his DGI score of 17 this date. However, pt displays appropriate reactional strategies to maintain his balance without physical assistance. Expect pt will progress quickly, thus all PT needs can be met with acute PT at this time. Educated and provided HEP handout on balance interventions to perform with supervision to further improve his balance and decrease his risk for falls. Educated pt to have someone supervise him initially with mobility at home to ensure his safety. Will continue to follow acutely.   Follow Up Recommendations No PT follow up;Supervision for mobility/OOB    Equipment Recommendations  None recommended by PT    Recommendations for Other Services       Precautions / Restrictions Precautions Precautions: Fall Restrictions Weight Bearing Restrictions: No      Mobility  Bed Mobility Overal bed mobility: Independent             General bed mobility comments: Pt able to transition sit > supine safely.    Transfers Overall transfer level: Needs assistance   Transfers: Sit to/from Stand Sit  to Stand: Min guard         General transfer comment: Min guard for safety, no LOB.  Ambulation/Gait Ambulation/Gait assistance: Min Gaffer (Feet): 250 Feet Assistive device: None Gait Pattern/deviations: Step-through pattern Gait velocity: WNL Gait velocity interpretation: >4.37 ft/sec, indicative of normal walking speed General Gait Details: Pt with step-through gait pattern, displaying intermittent minor trunk sways with necessity for small extra reactional step to maintain balance, primarily when changing head positions or completing a wuick 180 degress turn. Pt with difficulty clearing feet over obstacles. No physical assistance needed to maintain balance.  Stairs Stairs: Yes Stairs assistance: Min guard Stair Management: One rail Right;One rail Left;Step to pattern;Forwards Number of Stairs: 4 General stair comments: Negotiates stairs with 1 rail use at a time without LOB, min guard assist for safety.  Wheelchair Mobility    Modified Rankin (Stroke Patients Only) Modified Rankin (Stroke Patients Only) Pre-Morbid Rankin Score: No symptoms Modified Rankin: No significant disability     Balance Overall balance assessment: Needs assistance Sitting-balance support: Feet supported;No upper extremity supported Sitting balance-Leahy Scale: Good     Standing balance support: During functional activity;No upper extremity supported Standing balance-Leahy Scale: Fair Standing balance comment: Increased ankle reactional strategies and reactional step strategies, but able to maintain balance with mobility and challenges without physical assistance.                 Standardized Balance Assessment Standardized Balance Assessment : Dynamic Gait Index   Dynamic Gait Index Level Surface: Normal Change in Gait Speed: Normal Gait with Horizontal Head Turns: Mild  Impairment Gait with Vertical Head Turns: Mild Impairment Gait and Pivot Turn: Mild  Impairment Step Over Obstacle: Moderate Impairment Step Around Obstacles: Normal Steps: Moderate Impairment Total Score: 17       Pertinent Vitals/Pain Pain Assessment: No/denies pain    Home Living Family/patient expects to be discharged to:: Private residence Living Arrangements: Spouse/significant other Available Help at Discharge: Family Type of Home: House Home Access: Stairs to enter Entrance Stairs-Rails: Right Entrance Stairs-Number of Steps: 3 Home Layout: One level Home Equipment: Shower seat;Grab bars - tub/shower;Walker - standard;Adaptive equipment Additional Comments: no animals    Prior Function Level of Independence: Independent         Comments: driving working Scientist, research (medical) Dominance   Dominant Hand: Right    Extremity/Trunk Assessment   Upper Extremity Assessment Upper Extremity Assessment: Defer to OT evaluation    Lower Extremity Assessment Lower Extremity Assessment: RLE deficits/detail;LLE deficits/detail RLE Deficits / Details: Able to detect touch but numbness noted throughout (more numb than L); incoordination noted; MMT 5 grossly throughout; dynamic proprioception intact in ankle RLE Sensation: decreased light touch RLE Coordination: decreased gross motor LLE Deficits / Details: Able to detect touch but slight numbness anterior lower leg, dorsal foot, dorsal big toe, and medial ankle (less numb than R); dysdiadochokinesia noted and possible dysmetria; MMT 5 grossly throughout; dynamic proprioception intact in ankle LLE Sensation: decreased light touch LLE Coordination: decreased gross motor;decreased fine motor    Cervical / Trunk Assessment Cervical / Trunk Assessment: Normal  Communication   Communication: No difficulties  Cognition Arousal/Alertness: Awake/alert Behavior During Therapy: WFL for tasks assessed/performed Overall Cognitive Status: Within Functional Limits for tasks assessed                                         General Comments General comments (skin integrity, edema, etc.): Educated pt and his wife on findings during eval and that he is at risk for falls and will benefit from taking a moment to stand statically when initially getting up to ensure safety before ambulating and to have supervision with all mobility initially for safety; bil negative [employer] tests; Provided HEP handout and education to perform the following at a stable surface with guarding from wife: toe taps on objects, tandem stance, and Romberg stance    Exercises Other Exercises Other Exercises: wall bumps to work on weight shift and awarness to posture. pt with practice able to complete task without hip flexion and head leading   Assessment/Plan    PT Assessment Patient needs continued PT services  PT Problem List Decreased balance;Decreased coordination;Impaired sensation       PT Treatment Interventions DME instruction;Gait training;Stair training;Functional mobility training;Therapeutic activities;Therapeutic exercise;Balance training;Neuromuscular re-education;Cognitive remediation;Patient/family education    PT Goals (Current goals can be found in the Care Plan section)  Acute Rehab PT Goals Patient Stated Goal: to return home asap PT Goal Formulation: With patient/family Time For Goal Achievement: 04/05/21 Potential to Achieve Goals: Good    Frequency Min 4X/week   Barriers to discharge        Co-evaluation               AM-PAC PT "6 Clicks" Mobility  Outcome Measure Help needed turning from your back to your side while in a flat bed without using bedrails?: None Help needed moving from lying on your back to sitting on the  side of a flat bed without using bedrails?: None Help needed moving to and from a bed to a chair (including a wheelchair)?: A Little Help needed standing up from a chair using your arms (e.g., wheelchair or bedside chair)?: A Little Help needed to walk in hospital  room?: A Little Help needed climbing 3-5 steps with a railing? : A Little 6 Click Score: 20    End of Session Equipment Utilized During Treatment: Gait belt Activity Tolerance: Patient tolerated treatment well Patient left: in bed;with call bell/phone within reach;with bed alarm set;with family/visitor present   PT Visit Diagnosis: Unsteadiness on feet (R26.81);Difficulty in walking, not elsewhere classified (R26.2);Other symptoms and signs involving the nervous system (R29.898);Dizziness and giddiness (R42)    Time: 3976-7341 PT Time Calculation (min) (ACUTE ONLY): 34 min   Charges:   PT Evaluation $PT Eval Moderate Complexity: 1 Mod PT Treatments $Therapeutic Activity: 8-22 mins        Moishe Spice, PT, DPT Acute Rehabilitation Services  Pager: [phone] Office: [phone]   Orvan Falconer 03/29/2021, 10:45 AM

## 2021-03-29 NOTE — Care Management (Signed)
Patient declined OP OT referral at this time.

## 2021-03-29 NOTE — Progress Notes (Signed)
  Echocardiogram 2D Echocardiogram has been performed.  Austin Bruce F 03/29/2021, 2:01 PM

## 2021-03-29 NOTE — Progress Notes (Signed)
   Medicine team requested a 30 day event monitor to further evaluate the etiology of his stroke. Patient is not established with [facility], therefore will place order for Dr. Domenic Polite to read (DOD 03/29/21) and arrange a follow-up visit in [place] in 2 months to review results as patient lives in [place].   Abigail Butts, PA-C 03/29/21; 2:37 PM

## 2021-03-29 NOTE — Evaluation (Signed)
Occupational Therapy Evaluation Patient Details Name: Austin Bruce MRN: [id number] DOB: 17-Oct-1945 Today's Date: 03/29/2021    History of Present Illness 75 yo male admitted with dizziness BIL LE numbness and nausea MRI L frontal Lobe infract  PMH CVA high cholestrol HTN   Clinical Impression   PT admitted with CVA. Pt currently with functional limitiations due to the deficits listed below (see OT problem list). Pt currently with mild balance changes and self reports feeling unsteady. Pt reaching for environmental supports.  Pt will benefit from skilled OT to increase their independence and safety with adls and balance to allow discharge outpatient for balance.     Follow Up Recommendations  Outpatient OT    Equipment Recommendations  None recommended by OT    Recommendations for Other Services       Precautions / Restrictions Precautions Precautions: Fall      Mobility Bed Mobility Overal bed mobility: Independent                  Transfers Overall transfer level: Needs assistance   Transfers: Sit to/from Stand Sit to Stand: Min guard              Balance Overall balance assessment: Needs assistance Sitting-balance support: Bilateral upper extremity supported;Feet supported Sitting balance-Leahy Scale: Good     Standing balance support: Single extremity supported;During functional activity Standing balance-Leahy Scale: Fair                             ADL either performed or assessed with clinical judgement   ADL Overall ADL's : Needs assistance/impaired Eating/Feeding: Modified independent   Grooming: Wash/dry hands;Oral care;Supervision/safety;Standing               Lower Body Dressing: Supervision/safety;Bed level Lower Body Dressing Details (indicate cue type and reason): able to [place] socks Toilet Transfer: Supervision/safety           Functional mobility during ADLs: Min guard General ADL Comments: pt reports feeling  more unsteady and reaching for environmental supports     Vision Baseline Vision/History: Wears glasses Wears Glasses: At all times Vision Assessment?: No apparent visual deficits     Perception     Praxis      Pertinent Vitals/Pain Pain Assessment: No/denies pain     Hand Dominance Right   Extremity/Trunk Assessment Upper Extremity Assessment Upper Extremity Assessment: Overall WFL for tasks assessed   Lower Extremity Assessment Lower Extremity Assessment: Defer to PT evaluation;RLE deficits/detail;LLE deficits/detail RLE Deficits / Details: numbnes knee down LLE Deficits / Details: numbness knee down   Cervical / Trunk Assessment Cervical / Trunk Assessment: Normal   Communication Communication Communication: No difficulties   Cognition Arousal/Alertness: Awake/alert Behavior During Therapy: WFL for tasks assessed/performed Overall Cognitive Status: Within Functional Limits for tasks assessed                                     General Comments  orthostatic bp taken without any negative changes and charted    Exercises Exercises: Other exercises Other Exercises Other Exercises: wall bumps to work on weight shift and awarness to posture. pt with practice able to complete task without hip flexion and head leading   Shoulder Instructions      Home Living Family/patient expects to be discharged to:: Private residence Living Arrangements: Spouse/significant other Available Help at Discharge: Family Type  of Home: House Home Access: Stairs to enter [employer] of Steps: 3 Entrance Stairs-Rails: Right Home Layout: One level     Bathroom Shower/Tub: Occupational psychologist: Standard     Home Equipment: Shower seat;Grab bars - tub/shower;Walker - standard;Adaptive equipment Adaptive Equipment: Reacher Additional Comments: no animals      Prior Functioning/Environment Level of Independence: Independent         Comments: driving working shop        OT Problem List: Impaired balance (sitting and/or standing);Decreased activity tolerance;Decreased knowledge of use of DME or AE;Decreased knowledge of precautions      OT Treatment/Interventions: Self-care/ADL training;Therapeutic exercise;Neuromuscular education;Energy conservation;DME and/or AE instruction;Manual therapy;Therapeutic activities;Patient/family education;Balance training    OT Goals(Current goals can be found in the care plan section) Acute Rehab OT Goals Patient Stated Goal: to return home asap OT Goal Formulation: With patient Time For Goal Achievement: 04/12/21 Potential to Achieve Goals: Good  OT Frequency: Min 2X/week   Barriers to D/C:            Co-evaluation              AM-PAC OT "6 Clicks" Daily Activity     Outcome Measure Help from another person eating meals?: None Help from another person taking care of personal grooming?: None Help from another person toileting, which includes using toliet, bedpan, or urinal?: A Little Help from another person bathing (including washing, rinsing, drying)?: A Little Help from another person to put on and taking off regular upper body clothing?: None Help from another person to put on and taking off regular lower body clothing?: A Little 6 Click Score: 21   End of Session Nurse Communication: Mobility status;Precautions  Activity Tolerance: Patient tolerated treatment well Patient left: Other (comment) (passed to PT anessa)  OT Visit Diagnosis: Unsteadiness on feet (R26.81)                Time: [id number] OT Time Calculation (min): 33 min Charges:  OT General Charges $OT Visit: 1 Visit OT Evaluation $OT Eval Moderate Complexity: 1 Mod OT Treatments $Self Care/Home Management : 8-22 mins   Brynn, OTR/L  Acute Rehabilitation Services Pager: [phone] Office: [phone] .   Jeri Modena 03/29/2021, 9:57 AM

## 2021-03-29 NOTE — Discharge Summary (Signed)
Physician Discharge Summary  Austin Bruce [id number] DOB: 1946/05/08 DOA: 03/28/2021  PCP: [facility]  Admit date: 03/28/2021 Discharge date: 03/29/2021  Admitted From: Home Disposition:  Home  Recommendations for Outpatient Follow-up:  Follow up with PCP in 1 week Follow up with Neurology in 4 weeks Refer for outpatient vestibular PT Recommended 30-day cardiac event monitoring as outpatient, sent message to cardiology to arrange after discharge   Discharge Condition: Stable CODE STATUS: Full code Diet recommendation: Heart healthy diet  Brief/Interim Summary: From H&P by Dr. Wynetta Emery: "Austin Bruce is a 75 y.o. male with medical history significant for TIA in 2017 with negative MRI and MRA at that time he was treated at [facility].  He had subsequently been treated with aspirin 81 mg daily for secondary prevention however developed severe GI bleeding and had to cut the aspirin back to twice weekly.  He has HTN, hyperlipidemia and vertigo with age related hearing loss.  He woke up feeling normal this morning and he had breakfast with his wife.  He said he subsequently went to the bathroom and started having vertigo symptoms where he felt like the entire room was spinning.  He started having nausea.  He also had numbness to both legs associated with the dizziness.  He had no changes in speech or vision and no facial drooping however he reported that he did not feel normal.  His wife was concerned and brought him to the emergency department for further evaluation.  He initially had a CT scan of the head without contrast with no findings of acute CVA but there is chronic cerebellar infarction noted.  He also has chronic ischemic vascular changes.  Telemetry neurology was consulted as well.  There was concern that patient was having mild ischemic episode and MRI and MRA was recommended.  Patient was loaded with aspirin and Plavix and admission to [facility] was requested.   Neurology discussed tPA with the patient and wife and the decision was made to not pursue tPA at this time."  MRI revealed small acute or subacute white matter infarct in the left frontal lobe with severe chronic small vessel ischemic disease with chronic lacunar infarcts in the cerebrum and cerebellum.  Patient was evaluated by neurology as well as PT OT SLP.  He underwent echocardiogram as well with results as below.  His stroke was thought to be an incidental finding, symptoms correlating more to possible BPPV.  Dix-Hallpike was negative during hospitalization.  He was referred for outpatient vestibular PT for maneuver training.  He was continued on Plavix daily, no aspirin due to his history of GI bleeding on aspirin.  Discharge Diagnoses:  Principal Problem:   Acute CVA (cerebrovascular accident) ([place]) Active Problems:   History of CVA (cerebrovascular accident)   Former smoker   Hypertension   High cholesterol   Vertigo   Bilateral leg numbness   History of GI bleed   Hypokalemia   CKD stage G3b/A1, GFR 30-44 and albumin creatinine ratio <30 mg/g (HCC)   BPPV (benign paroxysmal positional vertigo)   Discharge Instructions  Discharge Instructions     Ambulatory referral to Neurology   Complete by: As directed    Follow up with stroke clinic NP (Jessica Vanschaick or Cecille Rubin, if both not available, consider Zachery Dauer, or Ahern) at [facility] in about 4 weeks. Thanks.   Ambulatory referral to Physical Therapy   Complete by: As directed    BPPV maneuver training. Thanks.   Call MD  for:  difficulty breathing, headache or visual disturbances   Complete by: As directed    Call MD for:  extreme fatigue   Complete by: As directed    Call MD for:  persistant dizziness or light-headedness   Complete by: As directed    Call MD for:  persistant nausea and vomiting   Complete by: As directed    Call MD for:  severe uncontrolled pain   Complete by: As directed    Call MD for:   temperature >100.4   Complete by: As directed    Diet - low sodium heart healthy   Complete by: As directed    Discharge instructions   Complete by: As directed    DO NOT TAKE ASPIRIN, NSAID, IBUPROFEN  You were cared for by a hospitalist during your hospital stay. If you have any questions about your discharge medications or the care you received while you were in the hospital after you are discharged, you can call the unit and ask to speak with the hospitalist on call if the hospitalist that took care of you is not available. Once you are discharged, your primary care physician will handle any further medical issues. Please note that NO REFILLS for any discharge medications will be authorized once you are discharged, as it is imperative that you return to your primary care physician (or establish a relationship with a primary care physician if you do not have one) for your aftercare needs so that they can reassess your need for medications and monitor your lab values.   Increase activity slowly   Complete by: As directed       Allergies as of 03/29/2021       Reactions   Penicillins Anaphylaxis        Medication List     STOP taking these medications    aspirin 81 MG EC tablet       TAKE these medications    amLODipine 10 MG tablet Commonly known as: NORVASC Take 10 mg by mouth daily.   clopidogrel 75 MG tablet Commonly known as: PLAVIX Take 1 tablet (75 mg total) by mouth daily. Start taking on: March 30, 2021   finasteride 5 MG tablet Commonly known as: PROSCAR Take 5 mg by mouth daily.   indapamide 2.5 MG tablet Commonly known as: LOZOL Take 2.5 mg by mouth daily.   lisinopril 40 MG tablet Commonly known as: ZESTRIL Take 40 mg by mouth daily.   multivitamin tablet Take 1 tablet by mouth daily.   potassium chloride SA 20 MEQ tablet Commonly known as: KLOR-CON Take 40 mEq by mouth daily.   propranolol 80 MG tablet Commonly known as: INDERAL Take 80 mg  by mouth daily.   simvastatin 40 MG tablet Commonly known as: ZOCOR Take 40 mg by mouth daily.   traMADol 50 MG tablet Commonly known as: ULTRAM Take 50 mg by mouth every 6 (six) hours as needed for moderate pain.        Follow-up Information     [facility]. Schedule an appointment as soon as possible for a visit in 1 month(s).   Specialty: Neurology Why: stroke clinic Contact information: [place]. Schedule an appointment as soon as possible for a visit in 1 week(s).   Specialty: Rehabilitation Why: for vertigo Contact information: [place] [id number] [place] [place]  [facility]. Schedule an appointment as soon as possible for a visit in 1 week(s).   Contact information: [place] [address] [phone]                Allergies  Allergen Reactions   Penicillins Anaphylaxis    Consultations: Neurology    Procedures/Studies: MR ANGIO HEAD WO CONTRAST  Result Date: 03/28/2021 CLINICAL DATA:  Neuro deficit, acute, stroke suspected. Dizziness. Numbness in the legs. EXAM: MRI HEAD WITHOUT CONTRAST MRA HEAD WITHOUT CONTRAST MRA NECK WITHOUT CONTRAST TECHNIQUE: Multiplanar, multiecho pulse sequences of the brain and surrounding structures were obtained without intravenous contrast. Angiographic images of the Circle of Willis were obtained using MRA technique without intravenous contrast. Angiographic images of the neck were obtained using MRA technique without intravenous contrast. Carotid stenosis measurements (when applicable) are obtained utilizing NASCET criteria, using the distal internal carotid diameter as the denominator. COMPARISON:  Head CT 03/28/2021 FINDINGS: MRI HEAD FINDINGS Brain: There is a small focus of diffusion  abnormality involving subcortical white matter in the high left frontal lobe suggestive of an acute or subacute small vessel infarct. No acute infarct is identified elsewhere. There are chronic microhemorrhages scattered throughout both cerebral hemispheres including in the basal ganglia as well as in the cerebellum which may be secondary to chronic hypertension. Confluent T2 hyperintensities in the cerebral white matter bilaterally are nonspecific but compatible with severe chronic small vessel ischemic disease. There is a chronic lacunar infarct involving the right thalamus and posterior limb of the internal capsule. There is also a small chronic infarct inferiorly in the right cerebellar hemisphere. There is mild-to-moderate cerebral atrophy. No mass, midline shift, or extra-axial fluid collection is identified. Vascular: Major intracranial vascular flow voids are preserved. Skull and upper cervical spine: Unremarkable bone marrow signal. Sinuses/Orbits: Unremarkable orbits. Chronic sinusitis in a right middle meatus obstruction pattern as previously noted. No significant mastoid fluid. Other: None. MRA HEAD FINDINGS The included intracranial portions of the vertebral arteries are patent to the basilar with the left being strongly dominant. Patent PICA and SCA origins are identified bilaterally. The basilar artery is widely patent. There is a small right posterior communicating artery. Both PCAs are patent without evidence of a significant proximal stenosis. The internal carotid arteries are patent from skull base to carotid termini without evidence of a significant stenosis on the left. Partial signal loss in the right paraclinoid ICA is favored to be artifactual given normal appearance on the neck MRA. ACAs and MCAs are patent without evidence of a significant left M1 or left A1 stenosis. Signal loss involving the distal right M1 segment and bilateral proximal M2 branches is likely artifactual but limits  assessment for a stenosis in these areas. The right A1 segment is severely hypoplastic. No aneurysm is identified. MRA NECK FINDINGS There is a normal variant aortic arch branching pattern with common origin of the brachiocephalic and left common carotid arteries. Motion artifact limits assessment of the proximal common carotid arteries, however the mid and distal common carotid arteries and cervical internal carotid arteries are widely patent bilaterally without evidence of a significant stenosis or dissection. The vertebral arteries are patent with antegrade flow bilaterally and with the left being dominant. Motion artifact limits assessment of the vertebral artery origins, however there is no evidence of a significant stenosis or dissection elsewhere on either side. IMPRESSION: 1. Small acute or subacute white matter infarct in the left frontal lobe. 2. Severe chronic small vessel ischemic disease with  chronic lacunar infarcts in the cerebrum and cerebellum. 3. No major arterial occlusion or convincing flow limiting proximal stenosis in the head and neck within limitations of artifact as detailed above. Electronically Signed   By: Logan Bores M.D.   On: 03/28/2021 17:48   MR ANGIO NECK WO CONTRAST  Result Date: 03/28/2021 CLINICAL DATA:  Neuro deficit, acute, stroke suspected. Dizziness. Numbness in the legs. EXAM: MRI HEAD WITHOUT CONTRAST MRA HEAD WITHOUT CONTRAST MRA NECK WITHOUT CONTRAST TECHNIQUE: Multiplanar, multiecho pulse sequences of the brain and surrounding structures were obtained without intravenous contrast. Angiographic images of the Circle of Willis were obtained using MRA technique without intravenous contrast. Angiographic images of the neck were obtained using MRA technique without intravenous contrast. Carotid stenosis measurements (when applicable) are obtained utilizing NASCET criteria, using the distal internal carotid diameter as the denominator. COMPARISON:  Head CT 03/28/2021  FINDINGS: MRI HEAD FINDINGS Brain: There is a small focus of diffusion abnormality involving subcortical white matter in the high left frontal lobe suggestive of an acute or subacute small vessel infarct. No acute infarct is identified elsewhere. There are chronic microhemorrhages scattered throughout both cerebral hemispheres including in the basal ganglia as well as in the cerebellum which may be secondary to chronic hypertension. Confluent T2 hyperintensities in the cerebral white matter bilaterally are nonspecific but compatible with severe chronic small vessel ischemic disease. There is a chronic lacunar infarct involving the right thalamus and posterior limb of the internal capsule. There is also a small chronic infarct inferiorly in the right cerebellar hemisphere. There is mild-to-moderate cerebral atrophy. No mass, midline shift, or extra-axial fluid collection is identified. Vascular: Major intracranial vascular flow voids are preserved. Skull and upper cervical spine: Unremarkable bone marrow signal. Sinuses/Orbits: Unremarkable orbits. Chronic sinusitis in a right middle meatus obstruction pattern as previously noted. No significant mastoid fluid. Other: None. MRA HEAD FINDINGS The included intracranial portions of the vertebral arteries are patent to the basilar with the left being strongly dominant. Patent PICA and SCA origins are identified bilaterally. The basilar artery is widely patent. There is a small right posterior communicating artery. Both PCAs are patent without evidence of a significant proximal stenosis. The internal carotid arteries are patent from skull base to carotid termini without evidence of a significant stenosis on the left. Partial signal loss in the right paraclinoid ICA is favored to be artifactual given normal appearance on the neck MRA. ACAs and MCAs are patent without evidence of a significant left M1 or left A1 stenosis. Signal loss involving the distal right M1 segment and  bilateral proximal M2 branches is likely artifactual but limits assessment for a stenosis in these areas. The right A1 segment is severely hypoplastic. No aneurysm is identified. MRA NECK FINDINGS There is a normal variant aortic arch branching pattern with common origin of the brachiocephalic and left common carotid arteries. Motion artifact limits assessment of the proximal common carotid arteries, however the mid and distal common carotid arteries and cervical internal carotid arteries are widely patent bilaterally without evidence of a significant stenosis or dissection. The vertebral arteries are patent with antegrade flow bilaterally and with the left being dominant. Motion artifact limits assessment of the vertebral artery origins, however there is no evidence of a significant stenosis or dissection elsewhere on either side. IMPRESSION: 1. Small acute or subacute white matter infarct in the left frontal lobe. 2. Severe chronic small vessel ischemic disease with chronic lacunar infarcts in the cerebrum and cerebellum. 3. No major arterial occlusion  or convincing flow limiting proximal stenosis in the head and neck within limitations of artifact as detailed above. Electronically Signed   By: Logan Bores M.D.   On: 03/28/2021 17:48   MR BRAIN WO CONTRAST  Result Date: 03/28/2021 CLINICAL DATA:  Neuro deficit, acute, stroke suspected. Dizziness. Numbness in the legs. EXAM: MRI HEAD WITHOUT CONTRAST MRA HEAD WITHOUT CONTRAST MRA NECK WITHOUT CONTRAST TECHNIQUE: Multiplanar, multiecho pulse sequences of the brain and surrounding structures were obtained without intravenous contrast. Angiographic images of the Circle of Willis were obtained using MRA technique without intravenous contrast. Angiographic images of the neck were obtained using MRA technique without intravenous contrast. Carotid stenosis measurements (when applicable) are obtained utilizing NASCET criteria, using the distal internal carotid  diameter as the denominator. COMPARISON:  Head CT 03/28/2021 FINDINGS: MRI HEAD FINDINGS Brain: There is a small focus of diffusion abnormality involving subcortical white matter in the high left frontal lobe suggestive of an acute or subacute small vessel infarct. No acute infarct is identified elsewhere. There are chronic microhemorrhages scattered throughout both cerebral hemispheres including in the basal ganglia as well as in the cerebellum which may be secondary to chronic hypertension. Confluent T2 hyperintensities in the cerebral white matter bilaterally are nonspecific but compatible with severe chronic small vessel ischemic disease. There is a chronic lacunar infarct involving the right thalamus and posterior limb of the internal capsule. There is also a small chronic infarct inferiorly in the right cerebellar hemisphere. There is mild-to-moderate cerebral atrophy. No mass, midline shift, or extra-axial fluid collection is identified. Vascular: Major intracranial vascular flow voids are preserved. Skull and upper cervical spine: Unremarkable bone marrow signal. Sinuses/Orbits: Unremarkable orbits. Chronic sinusitis in a right middle meatus obstruction pattern as previously noted. No significant mastoid fluid. Other: None. MRA HEAD FINDINGS The included intracranial portions of the vertebral arteries are patent to the basilar with the left being strongly dominant. Patent PICA and SCA origins are identified bilaterally. The basilar artery is widely patent. There is a small right posterior communicating artery. Both PCAs are patent without evidence of a significant proximal stenosis. The internal carotid arteries are patent from skull base to carotid termini without evidence of a significant stenosis on the left. Partial signal loss in the right paraclinoid ICA is favored to be artifactual given normal appearance on the neck MRA. ACAs and MCAs are patent without evidence of a significant left M1 or left A1  stenosis. Signal loss involving the distal right M1 segment and bilateral proximal M2 branches is likely artifactual but limits assessment for a stenosis in these areas. The right A1 segment is severely hypoplastic. No aneurysm is identified. MRA NECK FINDINGS There is a normal variant aortic arch branching pattern with common origin of the brachiocephalic and left common carotid arteries. Motion artifact limits assessment of the proximal common carotid arteries, however the mid and distal common carotid arteries and cervical internal carotid arteries are widely patent bilaterally without evidence of a significant stenosis or dissection. The vertebral arteries are patent with antegrade flow bilaterally and with the left being dominant. Motion artifact limits assessment of the vertebral artery origins, however there is no evidence of a significant stenosis or dissection elsewhere on either side. IMPRESSION: 1. Small acute or subacute white matter infarct in the left frontal lobe. 2. Severe chronic small vessel ischemic disease with chronic lacunar infarcts in the cerebrum and cerebellum. 3. No major arterial occlusion or convincing flow limiting proximal stenosis in the head and neck within limitations of  artifact as detailed above. Electronically Signed   By: Logan Bores M.D.   On: 03/28/2021 17:48   ECHOCARDIOGRAM COMPLETE  Result Date: 03/29/2021    ECHOCARDIOGRAM REPORT   Patient Name:   Austin Bruce Date of Exam: 03/29/2021 Medical Rec #:  [id number]    Height:       69.0 in Accession #:    [id number]   Weight:       218.0 lb Date of Birth:  [birth year]    BSA:          2.143 m Patient Age:    75 years     BP:           142/85 mmHg Patient Gender: M            HR:           60 bpm. Exam Location:  Inpatient Procedure: 2D Echo, Cardiac Doppler, Color Doppler and 3D Echo Indications:    Stroke I63.9  History:        Patient has no prior history of Echocardiogram examinations.  Sonographer:    Merrie Roof RDCS  Referring Phys: [place]  1. Left ventricular ejection fraction, by estimation, is 60 to 65%. The left ventricle has normal function. The left ventricle has no regional wall motion abnormalities. Left ventricular diastolic parameters were normal.  2. Right ventricular systolic function is normal. The right ventricular size is normal. Tricuspid regurgitation signal is inadequate for assessing PA pressure.  3. The mitral valve is grossly normal. Trivial mitral valve regurgitation.  4. The aortic valve is tricuspid. Aortic valve regurgitation is not visualized. Mild aortic valve sclerosis is present, with no evidence of aortic valve stenosis. Aortic valve mean gradient measures 5.0 mmHg. FINDINGS  Left Ventricle: Left ventricular ejection fraction, by estimation, is 60 to 65%. The left ventricle has normal function. The left ventricle has no regional wall motion abnormalities. The left ventricular internal cavity size was normal in size. There is  borderline left ventricular hypertrophy. Left ventricular diastolic parameters were normal. Right Ventricle: The right ventricular size is normal. No increase in right ventricular wall thickness. Right ventricular systolic function is normal. Tricuspid regurgitation signal is inadequate for assessing PA pressure. Left Atrium: Left atrial size was normal in size. Right Atrium: Right atrial size was normal in size. Pericardium: There is no evidence of pericardial effusion. Mitral Valve: The mitral valve is grossly normal. Trivial mitral valve regurgitation. Tricuspid Valve: The tricuspid valve is grossly normal. Tricuspid valve regurgitation is trivial. Aortic Valve: The aortic valve is tricuspid. There is mild aortic valve annular calcification. Aortic valve regurgitation is not visualized. Mild aortic valve sclerosis is present, with no evidence of aortic valve stenosis. Aortic valve mean gradient measures 5.0 mmHg. Aortic valve peak gradient  measures 8.6 mmHg. Aortic valve area, by VTI measures 2.43 cm. Pulmonic Valve: The pulmonic valve was grossly normal. Pulmonic valve regurgitation is trivial. Aorta: The aortic root is normal in size and structure. IAS/Shunts: No atrial level shunt detected by color flow Doppler.  LEFT VENTRICLE PLAX 2D LVIDd:         4.70 cm  Diastology LVIDs:         2.90 cm  LV e' medial:    5.77 cm/s LV PW:         0.90 cm  LV E/e' medial:  10.3 LV IVS:        1.10 cm  LV e' lateral:   9.14 cm/s  LVOT diam:     2.10 cm  LV E/e' lateral: 6.5 LV SV:         76 LV SV Index:   36 LVOT Area:     3.46 cm                          3D Volume EF:                         3D EF:        63 %                         LV EDV:       123 ml                         LV ESV:       45 ml                         LV SV:        78 ml RIGHT VENTRICLE RV Basal diam:  3.80 cm LEFT ATRIUM             Index       RIGHT ATRIUM           Index LA diam:        4.10 cm 1.91 cm/m  RA Area:     14.20 cm LA Vol (A2C):   41.7 ml 19.46 ml/m RA Volume:   34.60 ml  16.15 ml/m LA Vol (A4C):   45.4 ml 21.18 ml/m LA Biplane Vol: 44.3 ml 20.67 ml/m  AORTIC VALVE AV Area (Vmax):    2.66 cm AV Area (Vmean):   2.42 cm AV Area (VTI):     2.43 cm AV Vmax:           147.00 cm/s AV Vmean:          99.700 cm/s AV VTI:            0.314 m AV Peak Grad:      8.6 mmHg AV Mean Grad:      5.0 mmHg LVOT Vmax:         113.00 cm/s LVOT Vmean:        69.700 cm/s LVOT VTI:          0.220 m LVOT/AV VTI ratio: 0.70  AORTA Ao Root diam: 3.00 cm MITRAL VALVE MV Area (PHT): 3.42 cm    SHUNTS MV Decel Time: 222 msec    Systemic VTI:  0.22 m MV E velocity: 59.20 cm/s  Systemic Diam: 2.10 cm MV A velocity: 88.00 cm/s MV E/A ratio:  0.67 Rozann Lesches MD Electronically signed by Rozann Lesches MD Signature Date/Time: 03/29/2021/2:09:15 PM    Final    CT HEAD CODE STROKE WO CONTRAST  Result Date: 03/28/2021 CLINICAL DATA:  Code stroke. Neuro deficit, acute, stroke suspected dizzy,  unsteady. EXAM: CT HEAD WITHOUT CONTRAST TECHNIQUE: Contiguous axial images were obtained from the base of the skull through the vertex without intravenous contrast. COMPARISON:  01/01/2021 FINDINGS: Brain: There is no evidence of an acute infarct, intracranial hemorrhage, mass, midline shift, or extra-axial fluid collection. Confluent hypodensities in the cerebral white matter bilaterally are unchanged and nonspecific but compatible with severe chronic small vessel ischemic disease. There is a chronic lacunar infarct involving the posterior  limb of the right internal capsule and thalamus. A small chronic infarct inferiorly in the right cerebellar hemisphere is unchanged. There is mild cerebral atrophy. Vascular: No suspicious vessel hyperdensity. Skull: No fracture suspicious osseous lesion. Sinuses/Orbits: Right middle meatus obstruction pattern of chronic sinusitis with progressive complete opacification of the right maxillary, right ethmoid, and right frontal sinuses in the setting of prior functional endoscopic sinus surgery. Clear mastoid air cells. Unremarkable orbits. Other: None. ASPECTS [facility] Stroke Program Early CT Score) Not scored with this history. IMPRESSION: 1. No evidence of acute intracranial abnormality. 2. Severe chronic small vessel ischemic disease. 3. Small chronic cerebellar infarct. These results were called by telephone at the time of interpretation on 03/28/2021 at 11:40 am to Dr. Aletta Edouard, who verbally acknowledged these results. Electronically Signed   By: Logan Bores M.D.   On: 03/28/2021 11:44       Discharge Exam: Vitals:   03/29/21 0038 03/29/21 0757  BP:  (!) 142/85  Pulse: 68 61  Resp: 18 18  Temp: 98.9 F (37.2 C) 97.8 F (36.6 C)  SpO2:  95%    General: Pt is alert, awake, not in acute distress Cardiovascular: RRR, S1/S2 +, no edema Respiratory: CTA bilaterally, no wheezing, no rhonchi, no respiratory distress, no conversational dyspnea  Abdominal:  Soft, NT, ND, bowel sounds + Extremities: no edema, no cyanosis Psych: Normal mood and affect, stable judgement and insight     The results of significant diagnostics from this hospitalization (including imaging, microbiology, ancillary and laboratory) are listed below for reference.     Microbiology: Recent Results (from the past 240 hour(s))  Resp Panel by RT-PCR (Flu A&B, Covid) Nasopharyngeal Swab     Status: None   Collection Time: 03/28/21 11:34 AM   Specimen: Nasopharyngeal Swab; Nasopharyngeal(NP) swabs in vial transport medium  Result Value Ref Range Status   SARS Coronavirus 2 by RT PCR NEGATIVE NEGATIVE Final    Comment: (NOTE) SARS-CoV-2 target nucleic acids are NOT DETECTED.  The SARS-CoV-2 RNA is generally detectable in upper respiratory specimens during the acute phase of infection. The lowest concentration of SARS-CoV-2 viral copies this assay can detect is 138 copies/mL. A negative result does not preclude SARS-Cov-2 infection and should not be used as the sole basis for treatment or other patient management decisions. A negative result may occur with  improper specimen collection/handling, submission of specimen other than nasopharyngeal swab, presence of viral mutation(s) within the areas targeted by this assay, and inadequate number of viral copies(<138 copies/mL). A negative result must be combined with clinical observations, patient history, and epidemiological information. The expected result is Negative.  Fact Sheet for Patients:  EntrepreneurPulse.com.au  Fact Sheet for Healthcare Providers:  IncredibleEmployment.be  This test is no t yet approved or cleared by the [place] FDA and  has been authorized for detection and/or diagnosis of SARS-CoV-2 by FDA under an Emergency Use Authorization (EUA). This EUA will remain  in effect (meaning this test can be used) for the duration of the COVID-19 declaration under  Section 564(b)(1) of the Act, 21 U.S.C.section 360bbb-3(b)(1), unless the authorization is terminated  or revoked sooner.       Influenza A by PCR NEGATIVE NEGATIVE Final   Influenza B by PCR NEGATIVE NEGATIVE Final    Comment: (NOTE) The Xpert Xpress SARS-CoV-2/FLU/RSV plus assay is intended as an aid in the diagnosis of influenza from Nasopharyngeal swab specimens and should not be used as a sole basis for treatment. Nasal washings and aspirates  are unacceptable for Xpert Xpress SARS-CoV-2/FLU/RSV testing.  Fact Sheet for Patients: EntrepreneurPulse.com.au  Fact Sheet for Healthcare Providers: IncredibleEmployment.be  This test is not yet approved or cleared by the [place] FDA and has been authorized for detection and/or diagnosis of SARS-CoV-2 by FDA under an Emergency Use Authorization (EUA). This EUA will remain in effect (meaning this test can be used) for the duration of the COVID-19 declaration under Section 564(b)(1) of the Act, 21 U.S.C. section 360bbb-3(b)(1), unless the authorization is terminated or revoked.  Performed at [facility], [address], [place], [place] [address]      Labs: BNP (last 3 results) No results for input(s): BNP in the last 8760 hours. Basic Metabolic Panel: Recent Labs  Lab 03/28/21 1128 03/28/21 1129 03/29/21 0200  NA 131* 134* 136  K 3.2* 3.4* 3.2*  CL 97* 95* 100  CO2 26  --  25  GLUCOSE 108* 107* 103*  BUN '15 15 14  '$ CREATININE 1.62* 1.70* 1.29*  CALCIUM 9.1  --  9.6  MG  --   --  1.8   Liver Function Tests: Recent Labs  Lab 03/28/21 1128  AST 26  ALT 39  ALKPHOS 58  BILITOT 1.4*  PROT 7.5  ALBUMIN 4.0   No results for input(s): LIPASE, AMYLASE in the last 168 hours. No results for input(s): AMMONIA in the last 168 hours. CBC: Recent Labs  Lab 03/28/21 1128 03/28/21 1129 03/29/21 0200  WBC 9.7  --  8.4  NEUTROABS 6.3  --  4.4  HGB 16.1 16.0 16.3  HCT 44.6 47.0  44.5  MCV 91.8  --  89.7  PLT 242  --  239   Cardiac Enzymes: No results for input(s): CKTOTAL, CKMB, CKMBINDEX, TROPONINI in the last 168 hours. BNP: Invalid input(s): POCBNP CBG: Recent Labs  Lab 03/28/21 1133  GLUCAP 104*   D-Dimer No results for input(s): DDIMER in the last 72 hours. Hgb A1c No results for input(s): HGBA1C in the last 72 hours. Lipid Profile Recent Labs    03/29/21 0200  CHOL 125  HDL 31*  LDLCALC 50  TRIG 218*  CHOLHDL 4.0   Thyroid function studies No results for input(s): TSH, T4TOTAL, T3FREE, THYROIDAB in the last 72 hours.  Invalid input(s): FREET3 Anemia work up No results for input(s): VITAMINB12, FOLATE, FERRITIN, TIBC, IRON, RETICCTPCT in the last 72 hours. Urinalysis    Component Value Date/Time   COLORURINE STRAW (A) 03/28/2021 1210   APPEARANCEUR CLEAR 03/28/2021 1210   LABSPEC 1.004 (L) 03/28/2021 1210   PHURINE 7.0 03/28/2021 1210   GLUCOSEU NEGATIVE 03/28/2021 1210   HGBUR NEGATIVE 03/28/2021 1210   BILIRUBINUR NEGATIVE 03/28/2021 1210   KETONESUR NEGATIVE 03/28/2021 1210   PROTEINUR NEGATIVE 03/28/2021 1210   NITRITE NEGATIVE 03/28/2021 1210   LEUKOCYTESUR NEGATIVE 03/28/2021 1210   Sepsis Labs Invalid input(s): PROCALCITONIN,  WBC,  LACTICIDVEN Microbiology Recent Results (from the past 240 hour(s))  Resp Panel by RT-PCR (Flu A&B, Covid) Nasopharyngeal Swab     Status: None   Collection Time: 03/28/21 11:34 AM   Specimen: Nasopharyngeal Swab; Nasopharyngeal(NP) swabs in vial transport medium  Result Value Ref Range Status   SARS Coronavirus 2 by RT PCR NEGATIVE NEGATIVE Final    Comment: (NOTE) SARS-CoV-2 target nucleic acids are NOT DETECTED.  The SARS-CoV-2 RNA is generally detectable in upper respiratory specimens during the acute phase of infection. The lowest concentration of SARS-CoV-2 viral copies this assay can detect is 138 copies/mL. A negative result does  not preclude SARS-Cov-2 infection and should not  be used as the sole basis for treatment or other patient management decisions. A negative result may occur with  improper specimen collection/handling, submission of specimen other than nasopharyngeal swab, presence of viral mutation(s) within the areas targeted by this assay, and inadequate number of viral copies(<138 copies/mL). A negative result must be combined with clinical observations, patient history, and epidemiological information. The expected result is Negative.  Fact Sheet for Patients:  EntrepreneurPulse.com.au  Fact Sheet for Healthcare Providers:  IncredibleEmployment.be  This test is no t yet approved or cleared by the [place] FDA and  has been authorized for detection and/or diagnosis of SARS-CoV-2 by FDA under an Emergency Use Authorization (EUA). This EUA will remain  in effect (meaning this test can be used) for the duration of the COVID-19 declaration under Section 564(b)(1) of the Act, 21 U.S.C.section 360bbb-3(b)(1), unless the authorization is terminated  or revoked sooner.       Influenza A by PCR NEGATIVE NEGATIVE Final   Influenza B by PCR NEGATIVE NEGATIVE Final    Comment: (NOTE) The Xpert Xpress SARS-CoV-2/FLU/RSV plus assay is intended as an aid in the diagnosis of influenza from Nasopharyngeal swab specimens and should not be used as a sole basis for treatment. Nasal washings and aspirates are unacceptable for Xpert Xpress SARS-CoV-2/FLU/RSV testing.  Fact Sheet for Patients: EntrepreneurPulse.com.au  Fact Sheet for Healthcare Providers: IncredibleEmployment.be  This test is not yet approved or cleared by the [place] FDA and has been authorized for detection and/or diagnosis of SARS-CoV-2 by FDA under an Emergency Use Authorization (EUA). This EUA will remain in effect (meaning this test can be used) for the duration of the COVID-19 declaration under Section  564(b)(1) of the Act, 21 U.S.C. section 360bbb-3(b)(1), unless the authorization is terminated or revoked.  Performed at [facility], [address], [place], [place] [address]      Patient was seen and examined on the day of discharge and was found to be in stable condition. Time coordinating discharge: 35 minutes including assessment and coordination of care, as well as examination of the patient.   SIGNED:  Dessa Phi, DO [facility] 03/29/2021, 2:18 PM

## 2021-03-29 NOTE — Progress Notes (Addendum)
STROKE TEAM PROGRESS NOTE   INTERVAL HISTORY His wife is at the bedside.  Pt stated that he was in bathroom and bending down to pull off his pants and he had vertigo episode lasted a couple of minutes. He had one episode of vertigo when he got up from bed at middle of night in 2017 lasted several minutes, he was sent to [facility] and was told "TIA". He was put on ASA daily but caused LGIB later so it was changed to ASA x 2/week. During the interval time, he had several times of brief vertigo when lying down at night but denies other vertigo episodes. PT did Dix-Hallpike which as negative today.  But will refer to outpt vestibular PT maneuver training.   OBJECTIVE Vitals:   03/28/21 2045 03/28/21 2245 03/29/21 0038 03/29/21 0757  BP: (!) 145/99 (!) 146/77  (!) 142/85  Pulse: 66 67 68 61  Resp: '18 16 18 18  '$ Temp: 98 F (36.7 C) 97.9 F (36.6 C) 98.9 F (37.2 C) 97.8 F (36.6 C)  TempSrc: Oral Oral Oral Oral  SpO2: 97% 96%  95%  Weight:      Height:        CBC:  Recent Labs  Lab 03/28/21 1128 03/28/21 1129 03/29/21 0200  WBC 9.7  --  8.4  NEUTROABS 6.3  --  4.4  HGB 16.1 16.0 16.3  HCT 44.6 47.0 44.5  MCV 91.8  --  89.7  PLT 242  --  [id number]    Basic Metabolic Panel:  Recent Labs  Lab 03/28/21 1128 03/28/21 1129 03/29/21 0200  NA 131* 134* 136  K 3.2* 3.4* 3.2*  CL 97* 95* 100  CO2 26  --  25  GLUCOSE 108* 107* 103*  BUN '15 15 14  '$ CREATININE 1.62* 1.70* 1.29*  CALCIUM 9.1  --  9.6  MG  --   --  1.8    Lipid Panel:     Component Value Date/Time   CHOL 125 03/29/2021 0200   TRIG 218 (H) 03/29/2021 0200   HDL 31 (L) 03/29/2021 0200   CHOLHDL 4.0 03/29/2021 0200   VLDL 44 (H) 03/29/2021 0200   LDLCALC 50 03/29/2021 0200   HgbA1c: No results found for: HGBA1C Urine Drug Screen:     Component Value Date/Time   LABOPIA NONE DETECTED 03/28/2021 1210   COCAINSCRNUR NONE DETECTED 03/28/2021 1210   LABBENZ NONE DETECTED 03/28/2021 1210   AMPHETMU NONE  DETECTED 03/28/2021 1210   THCU NONE DETECTED 03/28/2021 1210   LABBARB NONE DETECTED 03/28/2021 1210    Alcohol Level     Component Value Date/Time   ETH <10 03/28/2021 1115    IMAGING   MR ANGIO HEAD WO CONTRAST  Result Date: 03/28/2021 CLINICAL DATA:  Neuro deficit, acute, stroke suspected. Dizziness. Numbness in the legs. EXAM: MRI HEAD WITHOUT CONTRAST MRA HEAD WITHOUT CONTRAST MRA NECK WITHOUT CONTRAST TECHNIQUE: Multiplanar, multiecho pulse sequences of the brain and surrounding structures were obtained without intravenous contrast. Angiographic images of the Circle of Willis were obtained using MRA technique without intravenous contrast. Angiographic images of the neck were obtained using MRA technique without intravenous contrast. Carotid stenosis measurements (when applicable) are obtained utilizing NASCET criteria, using the distal internal carotid diameter as the denominator. COMPARISON:  Head CT 03/28/2021 FINDINGS: MRI HEAD FINDINGS Brain: There is a small focus of diffusion abnormality involving subcortical white matter in the high left frontal lobe suggestive of an acute or subacute small vessel infarct. No acute  infarct is identified elsewhere. There are chronic microhemorrhages scattered throughout both cerebral hemispheres including in the basal ganglia as well as in the cerebellum which may be secondary to chronic hypertension. Confluent T2 hyperintensities in the cerebral white matter bilaterally are nonspecific but compatible with severe chronic small vessel ischemic disease. There is a chronic lacunar infarct involving the right thalamus and posterior limb of the internal capsule. There is also a small chronic infarct inferiorly in the right cerebellar hemisphere. There is mild-to-moderate cerebral atrophy. No mass, midline shift, or extra-axial fluid collection is identified. Vascular: Major intracranial vascular flow voids are preserved. Skull and upper cervical spine:  Unremarkable bone marrow signal. Sinuses/Orbits: Unremarkable orbits. Chronic sinusitis in a right middle meatus obstruction pattern as previously noted. No significant mastoid fluid. Other: None. MRA HEAD FINDINGS The included intracranial portions of the vertebral arteries are patent to the basilar with the left being strongly dominant. Patent PICA and SCA origins are identified bilaterally. The basilar artery is widely patent. There is a small right posterior communicating artery. Both PCAs are patent without evidence of a significant proximal stenosis. The internal carotid arteries are patent from skull base to carotid termini without evidence of a significant stenosis on the left. Partial signal loss in the right paraclinoid ICA is favored to be artifactual given normal appearance on the neck MRA. ACAs and MCAs are patent without evidence of a significant left M1 or left A1 stenosis. Signal loss involving the distal right M1 segment and bilateral proximal M2 branches is likely artifactual but limits assessment for a stenosis in these areas. The right A1 segment is severely hypoplastic. No aneurysm is identified. MRA NECK FINDINGS There is a normal variant aortic arch branching pattern with common origin of the brachiocephalic and left common carotid arteries. Motion artifact limits assessment of the proximal common carotid arteries, however the mid and distal common carotid arteries and cervical internal carotid arteries are widely patent bilaterally without evidence of a significant stenosis or dissection. The vertebral arteries are patent with antegrade flow bilaterally and with the left being dominant. Motion artifact limits assessment of the vertebral artery origins, however there is no evidence of a significant stenosis or dissection elsewhere on either side. IMPRESSION: 1. Small acute or subacute white matter infarct in the left frontal lobe. 2. Severe chronic small vessel ischemic disease with chronic  lacunar infarcts in the cerebrum and cerebellum. 3. No major arterial occlusion or convincing flow limiting proximal stenosis in the head and neck within limitations of artifact as detailed above. Electronically Signed   By: Logan Bores M.D.   On: 03/28/2021 17:48   MR ANGIO NECK WO CONTRAST  Result Date: 03/28/2021 CLINICAL DATA:  Neuro deficit, acute, stroke suspected. Dizziness. Numbness in the legs. EXAM: MRI HEAD WITHOUT CONTRAST MRA HEAD WITHOUT CONTRAST MRA NECK WITHOUT CONTRAST TECHNIQUE: Multiplanar, multiecho pulse sequences of the brain and surrounding structures were obtained without intravenous contrast. Angiographic images of the Circle of Willis were obtained using MRA technique without intravenous contrast. Angiographic images of the neck were obtained using MRA technique without intravenous contrast. Carotid stenosis measurements (when applicable) are obtained utilizing NASCET criteria, using the distal internal carotid diameter as the denominator. COMPARISON:  Head CT 03/28/2021 FINDINGS: MRI HEAD FINDINGS Brain: There is a small focus of diffusion abnormality involving subcortical white matter in the high left frontal lobe suggestive of an acute or subacute small vessel infarct. No acute infarct is identified elsewhere. There are chronic microhemorrhages scattered throughout both cerebral hemispheres  including in the basal ganglia as well as in the cerebellum which may be secondary to chronic hypertension. Confluent T2 hyperintensities in the cerebral white matter bilaterally are nonspecific but compatible with severe chronic small vessel ischemic disease. There is a chronic lacunar infarct involving the right thalamus and posterior limb of the internal capsule. There is also a small chronic infarct inferiorly in the right cerebellar hemisphere. There is mild-to-moderate cerebral atrophy. No mass, midline shift, or extra-axial fluid collection is identified. Vascular: Major intracranial  vascular flow voids are preserved. Skull and upper cervical spine: Unremarkable bone marrow signal. Sinuses/Orbits: Unremarkable orbits. Chronic sinusitis in a right middle meatus obstruction pattern as previously noted. No significant mastoid fluid. Other: None. MRA HEAD FINDINGS The included intracranial portions of the vertebral arteries are patent to the basilar with the left being strongly dominant. Patent PICA and SCA origins are identified bilaterally. The basilar artery is widely patent. There is a small right posterior communicating artery. Both PCAs are patent without evidence of a significant proximal stenosis. The internal carotid arteries are patent from skull base to carotid termini without evidence of a significant stenosis on the left. Partial signal loss in the right paraclinoid ICA is favored to be artifactual given normal appearance on the neck MRA. ACAs and MCAs are patent without evidence of a significant left M1 or left A1 stenosis. Signal loss involving the distal right M1 segment and bilateral proximal M2 branches is likely artifactual but limits assessment for a stenosis in these areas. The right A1 segment is severely hypoplastic. No aneurysm is identified. MRA NECK FINDINGS There is a normal variant aortic arch branching pattern with common origin of the brachiocephalic and left common carotid arteries. Motion artifact limits assessment of the proximal common carotid arteries, however the mid and distal common carotid arteries and cervical internal carotid arteries are widely patent bilaterally without evidence of a significant stenosis or dissection. The vertebral arteries are patent with antegrade flow bilaterally and with the left being dominant. Motion artifact limits assessment of the vertebral artery origins, however there is no evidence of a significant stenosis or dissection elsewhere on either side. IMPRESSION: 1. Small acute or subacute white matter infarct in the left frontal  lobe. 2. Severe chronic small vessel ischemic disease with chronic lacunar infarcts in the cerebrum and cerebellum. 3. No major arterial occlusion or convincing flow limiting proximal stenosis in the head and neck within limitations of artifact as detailed above. Electronically Signed   By: Logan Bores M.D.   On: 03/28/2021 17:48   MR BRAIN WO CONTRAST  Result Date: 03/28/2021 CLINICAL DATA:  Neuro deficit, acute, stroke suspected. Dizziness. Numbness in the legs. EXAM: MRI HEAD WITHOUT CONTRAST MRA HEAD WITHOUT CONTRAST MRA NECK WITHOUT CONTRAST TECHNIQUE: Multiplanar, multiecho pulse sequences of the brain and surrounding structures were obtained without intravenous contrast. Angiographic images of the Circle of Willis were obtained using MRA technique without intravenous contrast. Angiographic images of the neck were obtained using MRA technique without intravenous contrast. Carotid stenosis measurements (when applicable) are obtained utilizing NASCET criteria, using the distal internal carotid diameter as the denominator. COMPARISON:  Head CT 03/28/2021 FINDINGS: MRI HEAD FINDINGS Brain: There is a small focus of diffusion abnormality involving subcortical white matter in the high left frontal lobe suggestive of an acute or subacute small vessel infarct. No acute infarct is identified elsewhere. There are chronic microhemorrhages scattered throughout both cerebral hemispheres including in the basal ganglia as well as in the cerebellum which may be  secondary to chronic hypertension. Confluent T2 hyperintensities in the cerebral white matter bilaterally are nonspecific but compatible with severe chronic small vessel ischemic disease. There is a chronic lacunar infarct involving the right thalamus and posterior limb of the internal capsule. There is also a small chronic infarct inferiorly in the right cerebellar hemisphere. There is mild-to-moderate cerebral atrophy. No mass, midline shift, or extra-axial  fluid collection is identified. Vascular: Major intracranial vascular flow voids are preserved. Skull and upper cervical spine: Unremarkable bone marrow signal. Sinuses/Orbits: Unremarkable orbits. Chronic sinusitis in a right middle meatus obstruction pattern as previously noted. No significant mastoid fluid. Other: None. MRA HEAD FINDINGS The included intracranial portions of the vertebral arteries are patent to the basilar with the left being strongly dominant. Patent PICA and SCA origins are identified bilaterally. The basilar artery is widely patent. There is a small right posterior communicating artery. Both PCAs are patent without evidence of a significant proximal stenosis. The internal carotid arteries are patent from skull base to carotid termini without evidence of a significant stenosis on the left. Partial signal loss in the right paraclinoid ICA is favored to be artifactual given normal appearance on the neck MRA. ACAs and MCAs are patent without evidence of a significant left M1 or left A1 stenosis. Signal loss involving the distal right M1 segment and bilateral proximal M2 branches is likely artifactual but limits assessment for a stenosis in these areas. The right A1 segment is severely hypoplastic. No aneurysm is identified. MRA NECK FINDINGS There is a normal variant aortic arch branching pattern with common origin of the brachiocephalic and left common carotid arteries. Motion artifact limits assessment of the proximal common carotid arteries, however the mid and distal common carotid arteries and cervical internal carotid arteries are widely patent bilaterally without evidence of a significant stenosis or dissection. The vertebral arteries are patent with antegrade flow bilaterally and with the left being dominant. Motion artifact limits assessment of the vertebral artery origins, however there is no evidence of a significant stenosis or dissection elsewhere on either side. IMPRESSION: 1. Small  acute or subacute white matter infarct in the left frontal lobe. 2. Severe chronic small vessel ischemic disease with chronic lacunar infarcts in the cerebrum and cerebellum. 3. No major arterial occlusion or convincing flow limiting proximal stenosis in the head and neck within limitations of artifact as detailed above. Electronically Signed   By: Logan Bores M.D.   On: 03/28/2021 17:48   CT HEAD CODE STROKE WO CONTRAST  Result Date: 03/28/2021 CLINICAL DATA:  Code stroke. Neuro deficit, acute, stroke suspected dizzy, unsteady. EXAM: CT HEAD WITHOUT CONTRAST TECHNIQUE: Contiguous axial images were obtained from the base of the skull through the vertex without intravenous contrast. COMPARISON:  01/01/2021 FINDINGS: Brain: There is no evidence of an acute infarct, intracranial hemorrhage, mass, midline shift, or extra-axial fluid collection. Confluent hypodensities in the cerebral white matter bilaterally are unchanged and nonspecific but compatible with severe chronic small vessel ischemic disease. There is a chronic lacunar infarct involving the posterior limb of the right internal capsule and thalamus. A small chronic infarct inferiorly in the right cerebellar hemisphere is unchanged. There is mild cerebral atrophy. Vascular: No suspicious vessel hyperdensity. Skull: No fracture suspicious osseous lesion. Sinuses/Orbits: Right middle meatus obstruction pattern of chronic sinusitis with progressive complete opacification of the right maxillary, right ethmoid, and right frontal sinuses in the setting of prior functional endoscopic sinus surgery. Clear mastoid air cells. Unremarkable orbits. Other: None. ASPECTS [facility] Stroke Program  Early CT Score) Not scored with this history. IMPRESSION: 1. No evidence of acute intracranial abnormality. 2. Severe chronic small vessel ischemic disease. 3. Small chronic cerebellar infarct. These results were called by telephone at the time of interpretation on 03/28/2021 at  11:40 am to Dr. Aletta Edouard, who verbally acknowledged these results. Electronically Signed   By: Logan Bores M.D.   On: 03/28/2021 11:44    ECG - SR rate 64 BPM. (See cardiology reading for complete details)  PHYSICAL EXAM  Temp:  [97.7 F (36.5 C)-98.9 F (37.2 C)] 97.8 F (36.6 C) (07/24 0757) Pulse Rate:  [56-68] 61 (07/24 0757) Resp:  [16-22] 18 (07/24 0757) BP: (142-156)/(77-99) 142/85 (07/24 0757) SpO2:  [92 %-99 %] 95 % (07/24 0757)  General - Well nourished, well developed, in no apparent distress.  Ophthalmologic - fundi not visualized due to noncooperation.  Cardiovascular - Regular rhythm and rate.  Mental Status -  Level of arousal and orientation to time, place, and person were intact. Language including expression, naming, repetition, comprehension was assessed and found intact. Fund of Knowledge was assessed and was intact.  Cranial Nerves II - XII - II - Visual field intact OU. III, IV, VI - Extraocular movements intact. V - Facial sensation intact bilaterally. VII - Facial movement intact bilaterally. VIII - Hearing & vestibular intact bilaterally. X - Palate elevates symmetrically. XI - Chin turning & shoulder shrug intact bilaterally. XII - Tongue protrusion intact.  Motor Strength - The patient's strength was normal in all extremities and pronator drift was absent.  Bulk was normal and fasciculations were absent.   Motor Tone - Muscle tone was assessed at the neck and appendages and was normal.  Reflexes - The patient's reflexes were symmetrical in all extremities and he had no pathological reflexes.  Sensory - Light touch, temperature/pinprick were assessed and were symmetrical.    Coordination - The patient had normal movements in the hands and feet with no ataxia or dysmetria.  Tremor was absent.  Gait and Station - deferred.   ASSESSMENT/PLAN [name] is a 75 y.o. male with history of Htn, Hld and a previous cerebellar stroke who  presented after an intense bout of vertigo and lightheadedness associated with bilateral numbness, right > left. He did not receive IV t-PA due to mild deficits.   Possible BPPV Pt had several episode of brief vertigo in the setting of positional change 2017 with vertigo when getting out of bed in the middle of night Some episodes of lying down in bed at night This time vertigo with bending down However, Dix-Hallpik neg this time Will refer to outpt vestibular PT for maneuver training  Stroke, incidental finding: Small acute or subacute white matter infarct in the left frontal lobe, MCA/ACA watershed area, likely secondary to small vessel disease, but can not completely rule out cardioembolic source CT Head - No evidence of acute intracranial abnormality. Small chronic cerebellar infarct.  MRI head - Small acute or subacute white matter infarct in the left frontal lobe. Severe chronic small vessel ischemic disease with chronic lacunar infarcts in the cerebrum and cerebellum MRA Head and Neck - No major arterial occlusion or convincing flow limiting proximal stenosis in the head and neck within limitations of artifact as detailed above. 2D Echo EF 60-65% Recommend 30 day cardiac event monitoring as outpt to rule out afib LDL - 50 HgbA1c - pending UDS - negative VTE prophylaxis - Lovenox aspirin 81 mg daily prior to admission, now  on clopidogrel 75 mg daily. Continue on discharge. No ASA this time due to hx of LGIB on ASA Patient will be counseled to be compliant with his antithrombotic medications Ongoing aggressive stroke risk factor management Therapy recommendations:  Outpatient OT  and vestibular PT Disposition:  Pending  Hypertension Home BP meds: Inderal, Norvasc, Zestril, Lozol Current BP meds: Lozol, Inderal Stable Long-term BP goal normotensive  Hyperlipidemia Home Lipid lowering medication: Zocor 40 mg daily LDL 50, goal < 70 Current lipid lowering medication: Zocor 40 mg  daily Continue statin at discharge  Other Stroke Risk Factors Advanced age Former cigarette smoker - quit Previous ETOH use. Obesity, Body mass index is 32.19 kg/m., recommend weight loss, diet and exercise as appropriate   Other Active Problems, Findings, Recommendations and/or Plan Code status - Full Code Hypokalemia - 3.2 - supplemented CKD - stage  3a - creatinine - 1.62->1.70->1.29  Hospital day # 0  Neurology will sign off. Please call with questions. Pt will follow up with stroke clinic NP at [facility] in about 4 weeks. Thanks for the consult.  Rosalin Hawking, MD PhD Stroke Neurology 03/29/2021 1:42 PM   To contact Stroke Continuity provider, please refer to [email or web address] After hours, contact General Neurology

## 2021-03-29 NOTE — Care Management Obs Status (Signed)
[facility] NOTIFICATION   Patient Details  Name: Austin Bruce MRN: [id number] Date of Birth: 16-Sep-1945   Medicare Observation Status Notification Given:  Yes    Carles Collet, RN 03/29/2021, 2:44 PM

## 2021-03-30 ENCOUNTER — Encounter: Payer: Self-pay | Admitting: *Deleted

## 2021-03-30 LAB — HEMOGLOBIN A1C
Hgb A1c MFr Bld: 6.1 % — ABNORMAL HIGH (ref 4.8–5.6)
Mean Plasma Glucose: 128 mg/dL

## 2021-03-30 NOTE — Progress Notes (Signed)
Patient ID: Austin Bruce, male   DOB: May 07, 1946, 75 y.o.   MRN: [id number] Patient enrolled for [employer] to ship a 30 day cardiac event monitor to his home. Letter with instructions mailed to patient.

## 2021-04-02 ENCOUNTER — Ambulatory Visit (INDEPENDENT_AMBULATORY_CARE_PROVIDER_SITE_OTHER): Payer: Medicare Other

## 2021-04-02 DIAGNOSIS — R42 Dizziness and giddiness: Secondary | ICD-10-CM

## 2021-04-02 DIAGNOSIS — I639 Cerebral infarction, unspecified: Secondary | ICD-10-CM | POA: Diagnosis not present

## 2021-04-02 DIAGNOSIS — I4891 Unspecified atrial fibrillation: Secondary | ICD-10-CM

## 2021-04-05 DIAGNOSIS — I10 Essential (primary) hypertension: Secondary | ICD-10-CM | POA: Diagnosis not present

## 2021-04-05 DIAGNOSIS — E782 Mixed hyperlipidemia: Secondary | ICD-10-CM | POA: Diagnosis not present

## 2021-04-13 DIAGNOSIS — I6389 Other cerebral infarction: Secondary | ICD-10-CM | POA: Diagnosis not present

## 2021-04-14 ENCOUNTER — Other Ambulatory Visit: Payer: Self-pay | Admitting: Medical

## 2021-04-14 DIAGNOSIS — I4891 Unspecified atrial fibrillation: Secondary | ICD-10-CM

## 2021-04-14 DIAGNOSIS — I639 Cerebral infarction, unspecified: Secondary | ICD-10-CM

## 2021-04-14 DIAGNOSIS — R42 Dizziness and giddiness: Secondary | ICD-10-CM

## 2021-04-21 DIAGNOSIS — B029 Zoster without complications: Secondary | ICD-10-CM | POA: Diagnosis not present

## 2021-04-24 DIAGNOSIS — Z9189 Other specified personal risk factors, not elsewhere classified: Secondary | ICD-10-CM | POA: Diagnosis not present

## 2021-04-24 DIAGNOSIS — I471 Supraventricular tachycardia: Secondary | ICD-10-CM | POA: Diagnosis not present

## 2021-04-24 DIAGNOSIS — I472 Ventricular tachycardia: Secondary | ICD-10-CM | POA: Diagnosis not present

## 2021-04-28 DIAGNOSIS — Z09 Encounter for follow-up examination after completed treatment for conditions other than malignant neoplasm: Secondary | ICD-10-CM | POA: Diagnosis not present

## 2021-04-28 DIAGNOSIS — I639 Cerebral infarction, unspecified: Secondary | ICD-10-CM | POA: Diagnosis not present

## 2021-04-30 ENCOUNTER — Inpatient Hospital Stay: Payer: Medicare Other | Admitting: Adult Health

## 2021-04-30 DIAGNOSIS — I639 Cerebral infarction, unspecified: Secondary | ICD-10-CM | POA: Diagnosis not present

## 2021-05-26 NOTE — Progress Notes (Signed)
Cardiology Office Note    Date:  06/02/2021   ID:  Austin Bruce, Austin Bruce 1946/03/20, MRN [id number]   PCP:  [facility] [place]  Cardiologist:  None   Advanced Practice Provider:  No care team member to display Electrophysiologist:  None   [id number]   No chief complaint on file.   History of Present Illness:  Austin Bruce is a 75 y.o. male with history of hypertension, HLD, TIA in 2017 at [facility].  Patient was discharged from [facility] with an acute CVA 03/29/2021.  He was loaded with aspirin and Plavix and declined tPA.  MRI revealed small acute or subacute white matter infarct in the left frontal lobe with severe chronic small vessel ischemic disease and chronic lacunar infarcts in the cerebrum and cerebellum.  Aspirin was stopped due to his history of GI bleeding with aspirin.  2D echo normal LVEF 60 to 65%  Patient had a 30-day monitor placed for CVA.  Only 9 days were analyzed but he was in normal sinus rhythm without evidence of A. fib.  Patient comes in for f/u. Complains of weight gain. His kids aren't letting him do anything since his CVA. Has sleep apnea but not treated because he couldn't use the machine. Says he sleeps really hard. It's been over 4 yrs when he was in [facility]. No chest pain, dyspnea, palpitations, dizziness or presyncope.BP 162/87 this am an hour -1 1/2 hrs after his meds.  He checks his BP daily and it's sent to his PCP. Eats breakfast out daily. Crt 1.29 03/29/21.  Past Medical History:  Diagnosis Date   High cholesterol    Hypertension    Stroke [facility])     Past Surgical History:  Procedure Laterality Date   HAND SURGERY     HERNIA REPAIR     NOSE SURGERY     TONSILLECTOMY      Current Medications: Current Meds  Medication Sig   amLODipine (NORVASC) 10 MG tablet Take 10 mg by mouth daily.   clopidogrel (PLAVIX) 75 MG tablet Take 1 tablet (75 mg total) by mouth daily.   finasteride (PROSCAR) 5 MG  tablet Take 5 mg by mouth daily.   indapamide (LOZOL) 2.5 MG tablet Take 2.5 mg by mouth daily.   lisinopril (ZESTRIL) 40 MG tablet Take 40 mg by mouth daily.   Multiple Vitamin (MULTIVITAMIN) tablet Take 1 tablet by mouth daily.   potassium chloride SA (KLOR-CON) 20 MEQ tablet Take 40 mEq by mouth daily.   propranolol (INDERAL) 80 MG tablet Take 80 mg by mouth daily.   simvastatin (ZOCOR) 40 MG tablet Take 40 mg by mouth daily.   traMADol (ULTRAM) 50 MG tablet Take 50 mg by mouth every 6 (six) hours as needed for moderate pain.     Allergies:   Penicillins   Social History   Socioeconomic History   Marital status: Married    Spouse name: Not on file   Number of children: Not on file   Years of education: Not on file   Highest education level: Not on file  Occupational History   Not on file  Tobacco Use   Smoking status: Former    Types: Cigarettes   Smokeless tobacco: Never  Vaping Use   Vaping Use: Never used  Substance and Sexual Activity   Alcohol use: Not Currently   Drug use: Never   Sexual activity: Not on file  Other Topics Concern  Not on file  Social History Narrative   Not on file   Social Determinants of Health   Financial Resource Strain: Not on file  Food Insecurity: Not on file  Transportation Needs: Not on file  Physical Activity: Not on file  Stress: Not on file  Social Connections: Not on file     Family History:  The patient's  family history is not on file.   ROS:   Please see the history of present illness.    ROS All other systems reviewed and are negative.   PHYSICAL EXAM:   VS:  BP 134/80   Pulse 75   Ht _0  (1.753 m)   Wt 225 lb 9.6 oz (102.3 kg)   SpO2 96%   BMI 33.32 kg/m   Physical Exam  GEN: Obese, in no acute distress  Neck: no JVD, carotid bruits, or masses Cardiac:RRR; no murmurs, rubs, or gallops  Respiratory:  clear to auscultation bilaterally, normal work of breathing GI: soft, nontender, nondistended, + BS Ext:  without cyanosis, clubbing, or edema, Good distal pulses bilaterally Neuro:  Alert and Oriented x 3, Psych: euthymic mood, full affect  Wt Readings from Last 3 Encounters:  06/02/21 225 lb 9.6 oz (102.3 kg)  03/28/21 218 lb (98.9 kg)  01/01/21 230 lb 2.6 oz (104.4 kg)      Studies/Labs Reviewed:   EKG:  EKG is not ordered today.  The ekg reviewed 03/28/21 NSR, normal EKG  Recent Labs: 03/28/2021: ALT 39 03/29/2021: BUN 14; Creatinine, Ser 1.29; Hemoglobin 16.3; Magnesium 1.8; Platelets 239; Potassium 3.2; Sodium 136   Lipid Panel    Component Value Date/Time   CHOL 125 03/29/2021 0200   TRIG 218 (H) 03/29/2021 0200   HDL 31 (L) 03/29/2021 0200   CHOLHDL 4.0 03/29/2021 0200   VLDL 44 (H) 03/29/2021 0200   LDLCALC 50 03/29/2021 0200    Additional studies/ records that were reviewed today include:  30-day monitor 04/14/2021 [employer] monitor reviewed. 9 days analyzed. Predominant rhythm is sinus with IVCD, heart rate ranging from 53 bpm up to 101 bpm and average heart rate 64 bpm. No atrial fibrillation was noted. There were no pauses.   2D echo 03/29/2021 IMPRESSIONS     1. Left ventricular ejection fraction, by estimation, is 60 to 65%. The  left ventricle has normal function. The left ventricle has no regional  wall motion abnormalities. Left ventricular diastolic parameters were  normal.   2. Right ventricular systolic function is normal. The right ventricular  size is normal. Tricuspid regurgitation signal is inadequate for assessing  PA pressure.   3. The mitral valve is grossly normal. Trivial mitral valve  regurgitation.   4. The aortic valve is tricuspid. Aortic valve regurgitation is not  visualized. Mild aortic valve sclerosis is present, with no evidence of  aortic valve stenosis. Aortic valve mean gradient measures 5.0 mmHg.   FINDINGS   Left Ventricle: Left ventricular ejection fraction, by estimation, is 60  to 65%. The left ventricle has normal function.  The left ventricle has no  regional wall motion abnormalities. The left ventricular internal cavity  size was normal in size. There is   borderline left ventricular hypertrophy. Left ventricular diastolic  parameters were normal.   Right Ventricle: The right ventricular size is normal. No increase in  right ventricular wall thickness. Right ventricular systolic function is  normal. Tricuspid regurgitation signal is inadequate for assessing PA  pressure.   Left Atrium: Left atrial size was normal  in size.   Right Atrium: Right atrial size was normal in size.   Pericardium: There is no evidence of pericardial effusion.   Mitral Valve: The mitral valve is grossly normal. Trivial mitral valve  regurgitation.   Tricuspid Valve: The tricuspid valve is grossly normal. Tricuspid valve  regurgitation is trivial.   Aortic Valve: The aortic valve is tricuspid. There is mild aortic valve  annular calcification. Aortic valve regurgitation is not visualized. Mild  aortic valve sclerosis is present, with no evidence of aortic valve  stenosis. Aortic valve mean gradient  measures 5.0 mmHg. Aortic valve peak gradient measures 8.6 mmHg. Aortic  valve area, by VTI measures 2.43 cm.   Pulmonic Valve: The pulmonic valve was grossly normal. Pulmonic valve  regurgitation is trivial.   Aorta: The aortic root is normal in size and structure.   IAS/Shunts: No atrial level shunt detected by color flow Doppler.      Risk Assessment/Calculations:         ASSESSMENT:    1. History of CVA (cerebrovascular accident)   2. Primary hypertension   3. Hyperlipidemia, unspecified hyperlipidemia type   4. Obesity (BMI 30.0-34.9)      PLAN:  In order of problems listed above:  History of CVA 03/2021 not seen by [place] in the hospital but 30-day monitor showed no atrial fibrillation in the 9 days was worn on Plavix.  2D echo normal LVEF, no source of CVA.   Hypertension BP monitored by PCP daily.  Sometimes runs high in am based on what he eats. 2 gm sodium diet, increase exercise and weight loss.  HLD-on simvastatin  Obesity-exercise 150 min weekly, weight loss discussed.  Shared Decision Making/Informed Consent        Medication Adjustments/Labs and Tests Ordered: Current medicines are reviewed at length with the patient today.  Concerns regarding medicines are outlined above.  Medication changes, Labs and Tests ordered today are listed in the Patient Instructions below. Patient Instructions  Medication Instructions:  Your physician recommends that you continue on your current medications as directed. Please refer to the Current Medication list given to you today.  *If you need a refill on your cardiac medications before your next appointment, please call your pharmacy*   Lab Work: None If you have labs (blood work) drawn today and your tests are completely normal, you will receive your results only by: [place] (if you have MyChart) OR A paper copy in the mail If you have any lab test that is abnormal or we need to change your treatment, we will call you to review the results.   Testing/Procedures: None   Follow-Up: At [facility], you and your health needs are our priority.  As part of our continuing mission to provide you with exceptional heart care, we have created designated Provider Care Teams.  These Care Teams include your primary Cardiologist (physician) and Advanced Practice Providers (APPs -  Physician Assistants and Nurse Practitioners) who all work together to provide you with the care you need, when you need it.  We recommend signing up for the patient portal called "MyChart".  Sign up information is provided on this After Visit Summary.  MyChart is used to connect with patients for Virtual Visits (Telemedicine).  Patients are able to view lab/test results, encounter notes, upcoming appointments, etc.  Non-urgent messages can be sent to your provider  as well.   To learn more about what you can do with MyChart, go to [email or web address].    Your  next appointment:   9 month(s)  The format for your next appointment:   In Person  Provider:   NEW PATIENT- ESTABLISH WITH MD   Other Instructions   Exercise Information for Aging Adults Staying physically active is important as you age. The four types of exercises that are best for older adults are endurance, strength, balance, and flexibility. Contact your health care provider before you start any exercise routine. Ask your health care provider what activities are safe for you. What are the risks? Risks associated with exercising include: Overdoing it. This may lead to sore muscles or fatigue. Falls. Injuries. Dehydration. How to do these exercises Endurance exercises Endurance (aerobic) exercises raise your breathing rate and heart rate. Increasing your endurance helps you to do everyday tasks and stay healthy. By improving the health of your body system that includes your heart, lungs, and blood vessels (circulatory system), you may also delay or prevent diseases such as heart disease, diabetes, and bone loss (osteoporosis). Types of endurance exercises include: Sports. Indoor activities, such as using gym equipment, doing water aerobics, or dancing. Outdoor activities, such as biking or jogging. Tasks around the house, such as gardening, yard work, and heavy household chores like cleaning. Walking, such as hiking or walking around your neighborhood. When doing endurance exercises, make sure you: Are aware of your surroundings. Use safety equipment as directed. Dress in layers when exercising outdoors. Drink plenty of water to stay well hydrated. Build up endurance slowly. Start with 10 minutes at a time, and gradually build up to doing 30 minutes at a time. Unless your health care provider gave you different instructions, aim to exercise for a total of 150 minutes a week.  Spread out that time so you are working on endurance on 3 or more days a week. Strength exercises Lifting, pulling, or pushing weights helps to strengthen muscles. Having stronger muscles makes it easier to do everyday activities, such as getting up from a chair, climbing stairs, carrying groceries, and playing with grandchildren. Strength exercises include arm and leg exercises that may be done: With weights. Without weights (using your own body weight). With a resistance band. When doing strength exercises: Move smoothly and steadily. Do not suddenly thrust or jerk the weights, the resistance band, or your body. Start with no weights or with light weights, and gradually add more weight over time. Eventually, aim to use weights that are hard or very hard for you to lift. This means that you are able to do 8 repetitions with the weight, and the last few repetitions are very challenging. Lift or push weights into position for 3 seconds, hold the position for 1 second, and then take 3 seconds to return to your starting position. Breathe out (exhale) during difficult movements, like lifting or pushing weights. Breathe in (inhale) to relax your muscles before the next repetition. Consider alternating arms or legs, especially when you first start strength exercises. Expect some slight muscle soreness after each session. Do strength exercises on 2 or more days a week, for 30 minutes at a time. Avoid exercising the same muscle groups two days in a row. For example, if you work on your leg muscles one day, work on your arm muscles the next day. When you can do two sets of 10-15 repetitions with a certain weight, increase the amount of weight. Balance Balance exercises can help to prevent falls. Balance exercises include: Standing on one foot. Heel-to-toe walk. Balance walk. Tai chi. Make sure you have something  sturdy to hold onto while doing balance exercises, such as a sturdy chair. As your balance  improves, challenge yourself by holding onto the chair with one hand instead of two, and then with no hands. Trying exercises with your eyes closed also challenges your balance, but be sure to have a sturdy surface (like a countertop) close by in case you need it. Do balance exercises as often as you want, or as often as directed by your health care provider. Strength exercises for the lower body also help to improve balance. Flexibility Flexibility exercises improve how far you can bend, straighten, move, or rotate parts of your body (range of motion). These exercises also help you to do everyday activities such as getting dressed or reaching for objects. Flexibility exercises include stretching different parts of the body, and they may be done in a standing or seated position or on the floor. When stretching, make sure you: Keep a slight bend in your arms and legs. Avoid completely straightening ("locking") your joints. Do not stretch so far that you feel pain. You should feel a mild stretching feeling. You may try stretching farther as you become more flexible over time. Relax and breathe between stretches. Hold onto something sturdy for balance as needed. Hold each stretch for 10-30 seconds. Repeat each stretch 3-5 times. General safety tips Exercise in well-lit areas. Do not hold your breath during exercises or stretches. Warm up before exercising, and cool down after exercising. This can help prevent injury. Drink plenty of water during exercise or any activity that makes you sweat. Use smooth, steady movements. Do not use sudden, jerking movements, especially when lifting weights or doing flexibility exercises. If you are not sure if an exercise is safe for you, or you are not sure how to do an exercise, talk with your health care provider. This is especially important if you have had surgery on muscles, bones, or joints (orthopedic surgery). Where to find more information You can find more  information about exercise for older adults from: Your local health department, fitness center, or community center. These facilities may have programs for aging adults. [employer] on Aging: http://kim-miller.com/ National Council on Aging: www.ncoa.org Summary Staying physically active is important as you age. Make sure to contact your health care provider before you start any exercise routine. Ask your health care provider what activities are safe for you. Doing endurance, strength, balance, and flexibility exercises can help to delay or prevent certain diseases, such as heart disease, diabetes, and bone loss (osteoporosis). This information is not intended to replace advice given to you by your health care provider. Make sure you discuss any questions you have with your health care provider. Document Revised: 12/12/2019 Document Reviewed: 12/12/2019 Elsevier Patient Education  [place].          Two Gram Sodium Diet 2000 mg  What is Sodium? Sodium is a mineral found naturally in many foods. The most significant source of sodium in the diet is table salt, which is about 40% sodium.  Processed, convenience, and preserved foods also contain a large amount of sodium.  The body needs only 500 mg of sodium daily to function,  A normal diet provides more than enough sodium even if you do not use salt.  Why Limit Sodium? A build up of sodium in the body can cause thirst, increased blood pressure, shortness of breath, and water retention.  Decreasing sodium in the diet can reduce edema and risk of heart attack or  stroke associated with high blood pressure.  Keep in mind that there are many other factors involved in these health problems.  Heredity, obesity, lack of exercise, cigarette smoking, stress and what you eat all play a role.  General Guidelines: Do not add salt at the table or in cooking.  One teaspoon of salt contains over 2 grams of sodium. Read food labels Avoid  processed and convenience foods Ask your dietitian before eating any foods not dicussed in the menu planning guidelines Consult your physician if you wish to use a salt substitute or a sodium containing medication such as antacids.  Limit milk and milk products to 16 oz (2 cups) per day.  Shopping Hints: READ LABELS!! "Dietetic" does not necessarily mean low sodium. Salt and other sodium ingredients are often added to foods during processing.    Menu Planning Guidelines Food Group Choose More Often Avoid  Beverages (see also the milk group All fruit juices, low-sodium, salt-free vegetables juices, low-sodium carbonated beverages Regular vegetable or tomato juices, commercially softened water used for drinking or cooking  Breads and Cereals Enriched white, wheat, rye and pumpernickel bread, hard rolls and dinner rolls; muffins, cornbread and waffles; most dry cereals, cooked cereal without added salt; unsalted crackers and breadsticks; low sodium or homemade bread crumbs Bread, rolls and crackers with salted tops; quick breads; instant hot cereals; pancakes; commercial bread stuffing; self-rising flower and biscuit mixes; regular bread crumbs or cracker crumbs  Desserts and Sweets Desserts and sweets mad with mild should be within allowance Instant pudding mixes and cake mixes  Fats Butter or margarine; vegetable oils; unsalted salad dressings, regular salad dressings limited to 1 Tbs; light, sour and heavy cream Regular salad dressings containing bacon fat, bacon bits, and salt pork; snack dips made with instant soup mixes or processed cheese; salted nuts  Fruits Most fresh, frozen and canned fruits Fruits processed with salt or sodium-containing ingredient (some dried fruits are processed with sodium sulfites        Vegetables Fresh, frozen vegetables and low- sodium canned vegetables Regular canned vegetables, sauerkraut, pickled vegetables, and others prepared in brine; frozen vegetables in  sauces; vegetables seasoned with ham, bacon or salt pork  Condiments, Sauces, Miscellaneous  Salt substitute with physician's approval; pepper, herbs, spices; vinegar, lemon or lime juice; hot pepper sauce; garlic powder, onion powder, low sodium soy sauce (1 Tbs.); low sodium condiments (ketchup, chili sauce, mustard) in limited amounts (1 tsp.) fresh ground horseradish; unsalted tortilla chips, pretzels, potato chips, popcorn, salsa (1/4 cup) Any seasoning made with salt including garlic salt, celery salt, onion salt, and seasoned salt; sea salt, rock salt, kosher salt; meat tenderizers; monosodium glutamate; mustard, regular soy sauce, barbecue, sauce, chili sauce, teriyaki sauce, steak sauce, Worcestershire sauce, and most flavored vinegars; canned gravy and mixes; regular condiments; salted snack foods, olives, picles, relish, horseradish sauce, catsup   Food preparation: Try these seasonings Meats:    Pork Sage, onion Serve with applesauce  Chicken Poultry seasoning, thyme, parsley Serve with cranberry sauce  Lamb Curry powder, rosemary, garlic, thyme Serve with mint sauce or jelly  Veal Marjoram, basil Serve with current jelly, cranberry sauce  Beef Pepper, bay leaf Serve with dry mustard, unsalted chive butter  Fish [place] leaf, dill Serve with unsalted lemon butter, unsalted parsley butter  Vegetables:    Asparagus Lemon juice   Broccoli Lemon juice   Carrots Mustard dressing parsley, mint, nutmeg, glazed with unsalted butter and sugar   Green beans Marjoram, lemon juice, nutmeg,dill  seed   Avaya, marjoram, onion   Spice /blend for [employer]" 4 tsp ground thyme 1 tsp ground sage 3 tsp ground rosemary 4 tsp ground marjoram   Test your knowledge A product that says "Salt Free" may still contain sodium. True or False Garlic Powder and Hot Pepper Sauce an be used as alternative seasonings.True or False Processed foods have more sodium than fresh foods.  True or False Canned  Vegetables have less sodium than froze True or False   WAYS TO DECREASE YOUR SODIUM INTAKE Avoid the use of added salt in cooking and at the table.  Table salt (and other prepared seasonings which contain salt) is probably one of the greatest sources of sodium in the diet.  Unsalted foods can gain flavor from the sweet, sour, and butter taste sensations of herbs and spices.  Instead of using salt for seasoning, try the following seasonings with the foods listed.  Remember: how you use them to enhance natural food flavors is limited only by your creativity... Allspice-Meat, fish, eggs, fruit, peas, red and yellow vegetables Almond Extract-Fruit baked goods Anise Seed-Sweet breads, fruit, carrots, beets, cottage cheese, cookies (tastes like licorice) Basil-Meat, fish, eggs, vegetables, rice, vegetables salads, soups, sauces Bay Leaf-Meat, fish, stews, poultry Burnet-Salad, vegetables (cucumber-like flavor) Caraway Seed-Bread, cookies, cottage cheese, meat, vegetables, cheese, rice Cardamon-Baked goods, fruit, soups Celery Powder or seed-Salads, salad dressings, sauces, meatloaf, soup, bread.Do not use  celery salt Chervil-Meats, salads, fish, eggs, vegetables, cottage cheese (parsley-like flavor) Chili Power-Meatloaf, chicken cheese, corn, eggplant, egg dishes Chives-Salads cottage cheese, egg dishes, soups, vegetables, sauces Cilantro-Salsa, casseroles Cinnamon-Baked goods, fruit, pork, lamb, chicken, carrots Cloves-Fruit, baked goods, fish, pot roast, green beans, beets, carrots Coriander-Pastry, cookies, meat, salads, cheese (lemon-orange flavor) Cumin-Meatloaf, fish,cheese, eggs, cabbage,fruit pie (caraway flavor) [employer], fruit, eggs, fish, poultry, cottage cheese, vegetables Dill Seed-Meat, cottage cheese, poultry, vegetables, fish, salads, bread Fennel Seed-Bread, cookies, apples, pork, eggs, fish, beets, cabbage, cheese, Licorice-like flavor Garlic-(buds or powder) Salads,  meat, poultry, fish, bread, butter, vegetables, potatoes.Do not  use garlic salt Ginger-Fruit, vegetables, baked goods, meat, fish, poultry Horseradish Root-Meet, vegetables, butter Lemon Juice or Extract-Vegetables, fruit, tea, baked goods, fish salads Mace-Baked goods fruit, vegetables, fish, poultry (taste like nutmeg) Maple Extract-Syrups Marjoram-Meat, chicken, fish, vegetables, breads, green salads (taste like Sage) Mint-Tea, lamb, sherbet, vegetables, desserts, carrots, cabbage Mustard, Dry or Seed-Cheese, eggs, meats, vegetables, poultry Nutmeg-Baked goods, fruit, chicken, eggs, vegetables, desserts Onion Powder-Meat, fish, poultry, vegetables, cheese, eggs, bread, rice salads (Do not use   Onion salt) Orange Extract-Desserts, baked goods Oregano-Pasta, eggs, cheese, onions, pork, lamb, fish, chicken, vegetables, green salads Paprika-Meat, fish, poultry, eggs, cheese, vegetables Parsley Flakes-Butter, vegetables, meat fish, poultry, eggs, bread, salads (certain forms may   Contain sodium Pepper-Meat fish, poultry, vegetables, eggs Peppermint Extract-Desserts, baked goods Poppy Seed-Eggs, bread, cheese, fruit dressings, baked goods, noodles, vegetables, cottage  [employer], poultry, meat, fish, cauliflower, turnips,eggs bread Saffron-Rice, bread, veal, chicken, fish, eggs Sage-Meat, fish, poultry, onions, eggplant, tomateos, pork, stews Savory-Eggs, salads, poultry, meat, rice, vegetables, soups, pork Tarragon-Meat, poultry, fish, eggs, butter, vegetables (licorice-like flavor)  Thyme-Meat, poultry, fish, eggs, vegetables, (clover-like flavor), sauces, soups Tumeric-Salads, butter, eggs, fish, rice, vegetables (saffron-like flavor) Vanilla Extract-Baked goods, candy Vinegar-Salads, vegetables, meat marinades Walnut Extract-baked goods, candy   2. Choose your Foods Wisely   The following is a list of foods to avoid which are high in  sodium:  Meats-Avoid all smoked, canned, salt cured, dried and kosher meat and fish as  well as Anchovies   [employer] meats:Bologna, Liverwurst, Pastrami Canned meat or fish  Marinated herring Caviar    Pepperoni Corned Beef   Pizza Dried chipped beef  Salami Frozen breaded fish or meat Salt pork Frankfurters or hot dogs  Sardines Gefilte fish   Sausage Ham (boiled ham, Proscuitto Smoked butt    spiced ham)   Spam      TV Dinners Vegetables Canned vegetables (Regular) Relish Canned mushrooms  Sauerkraut Olives    Tomato juice Pickles  Bakery and Dessert Products Canned puddings  Cream pies Cheesecake   Decorated cakes Cookies  Beverages/Juices Tomato juice, regular  Gatorade   V-8 vegetable juice, regular  Breads and Cereals Biscuit mixes   Salted potato chips, corn chips, pretzels Bread stuffing mixes  Salted crackers and rolls Pancake and waffle mixes Self-rising flour  Seasonings Accent    Meat sauces Barbecue sauce  Meat tenderizer Catsup    Monosodium glutamate (MSG) Celery salt   Onion salt Chili sauce   Prepared mustard Garlic salt   Salt, seasoned salt, sea salt Gravy mixes   Soy sauce Horseradish   Steak sauce Ketchup   Tartar sauce Lite salt    Teriyaki sauce Marinade mixes   Worcestershire sauce  Others Baking powder   Cocoa and cocoa mixes Baking soda   Commercial casserole mixes Candy-caramels, chocolate  Dehydrated soups    Bars, fudge,nougats  Instant rice and pasta mixes Canned broth or soup  Maraschino cherries Cheese, aged and processed cheese and cheese spreads  Learning Assessment Quiz  Indicated T (for True) or F (for False) for each of the following statements:  _____ Fresh fruits and vegetables and unprocessed grains are generally low in sodium _____ Water may contain a considerable amount of sodium, depending on the source _____ You can always tell if a food is high in sodium by tasting it _____ Certain laxatives my be  high in sodium and should be avoided unless prescribed   by a physician or pharmacist _____ Salt substitutes may be used freely by anyone on a sodium restricted diet _____ Sodium is present in table salt, food additives and as a natural component of   most foods _____ Table salt is approximately 90% sodium _____ Limiting sodium intake may help prevent excess fluid accumulation in the body _____ On a sodium-restricted diet, seasonings such as bouillon soy sauce, and    cooking wine should be used in place of table salt _____ On an ingredient list, a product which lists monosodium glutamate as the first   ingredient is an appropriate food to include on a low sodium diet  Circle the best answer(s) to the following statements (Hint: there may be more than one correct answer)  11. On a low-sodium diet, some acceptable snack items are:    A. Olives  F. Bean dip   K. Grapefruit juice    B. Salted Pretzels G. Commercial Popcorn   L. Canned peaches    C. Carrot Sticks  H. Bouillon   M. Unsalted nuts   D. [place] fries  I. Peanut butter crackers N. Salami   E. Sweet pickles J. Tomato Juice   O. Pizza  12.  Seasonings that may be used freely on a reduced - sodium diet include   A. Lemon wedges F.Monosodium glutamate K. Celery seed    B.Soysauce   G. Pepper   L. Mustard powder   C. Sea salt  H. Cooking wine  M.  Onion flakes   D. Vinegar  E. Prepared horseradish N. Salsa   E. Sage   J. Worcestershire sauce  O. [address]    Sumner Boast, PA-C  06/02/2021 11:38 AM    [facility] [place], [place], [place]  [address] Phone: [phone]; Fax: [phone]

## 2021-06-02 ENCOUNTER — Other Ambulatory Visit: Payer: Self-pay

## 2021-06-02 ENCOUNTER — Ambulatory Visit: Payer: Medicare Other | Admitting: Physician Assistant

## 2021-06-02 ENCOUNTER — Encounter: Payer: Self-pay | Admitting: Physician Assistant

## 2021-06-02 VITALS — BP 134/80 | HR 75 | Ht 69.0 in | Wt 225.6 lb

## 2021-06-02 DIAGNOSIS — E669 Obesity, unspecified: Secondary | ICD-10-CM | POA: Diagnosis not present

## 2021-06-02 DIAGNOSIS — Z8673 Personal history of transient ischemic attack (TIA), and cerebral infarction without residual deficits: Secondary | ICD-10-CM | POA: Diagnosis not present

## 2021-06-02 DIAGNOSIS — I10 Essential (primary) hypertension: Secondary | ICD-10-CM

## 2021-06-02 DIAGNOSIS — E785 Hyperlipidemia, unspecified: Secondary | ICD-10-CM | POA: Diagnosis not present

## 2021-06-02 NOTE — Patient Instructions (Signed)
Medication Instructions:  Your physician recommends that you continue on your current medications as directed. Please refer to the Current Medication list given to you today.  *If you need a refill on your cardiac medications before your next appointment, please call your pharmacy*   Lab Work: None If you have labs (blood work) drawn today and your tests are completely normal, you will receive your results only by: [facility] (if you have MyChart) OR A paper copy in the mail If you have any lab test that is abnormal or we need to change your treatment, we will call you to review the results.   Testing/Procedures: None   Follow-Up: At [facility], you and your health needs are our priority.  As part of our continuing mission to provide you with exceptional heart care, we have created designated Provider Care Teams.  These Care Teams include your primary Cardiologist (physician) and Advanced Practice Providers (APPs -  Physician Assistants and Nurse Practitioners) who all work together to provide you with the care you need, when you need it.  We recommend signing up for the patient portal called "MyChart".  Sign up information is provided on this After Visit Summary.  MyChart is used to connect with patients for Virtual Visits (Telemedicine).  Patients are able to view lab/test results, encounter notes, upcoming appointments, etc.  Non-urgent messages can be sent to your provider as well.   To learn more about what you can do with MyChart, go to [email or web address].    Your next appointment:   9 month(s)  The format for your next appointment:   In Person  Provider:   NEW PATIENT- ESTABLISH WITH MD   Other Instructions   Exercise Information for Aging Adults Staying physically active is important as you age. The four types of exercises that are best for older adults are endurance, strength, balance, and flexibility. Contact your health care provider before you start any  exercise routine. Ask your health care provider what activities are safe for you. What are the risks? Risks associated with exercising include: Overdoing it. This may lead to sore muscles or fatigue. Falls. Injuries. Dehydration. How to do these exercises Endurance exercises Endurance (aerobic) exercises raise your breathing rate and heart rate. Increasing your endurance helps you to do everyday tasks and stay healthy. By improving the health of your body system that includes your heart, lungs, and blood vessels (circulatory system), you may also delay or prevent diseases such as heart disease, diabetes, and bone loss (osteoporosis). Types of endurance exercises include: Sports. Indoor activities, such as using gym equipment, doing water aerobics, or dancing. Outdoor activities, such as biking or jogging. Tasks around the house, such as gardening, yard work, and heavy household chores like cleaning. Walking, such as hiking or walking around your neighborhood. When doing endurance exercises, make sure you: Are aware of your surroundings. Use safety equipment as directed. Dress in layers when exercising outdoors. Drink plenty of water to stay well hydrated. Build up endurance slowly. Start with 10 minutes at a time, and gradually build up to doing 30 minutes at a time. Unless your health care provider gave you different instructions, aim to exercise for a total of 150 minutes a week. Spread out that time so you are working on endurance on 3 or more days a week. Strength exercises Lifting, pulling, or pushing weights helps to strengthen muscles. Having stronger muscles makes it easier to do everyday activities, such as getting up from a chair, climbing stairs, carrying  groceries, and playing with grandchildren. Strength exercises include arm and leg exercises that may be done: With weights. Without weights (using your own body weight). With a resistance band. When doing strength  exercises: Move smoothly and steadily. Do not suddenly thrust or jerk the weights, the resistance band, or your body. Start with no weights or with light weights, and gradually add more weight over time. Eventually, aim to use weights that are hard or very hard for you to lift. This means that you are able to do 8 repetitions with the weight, and the last few repetitions are very challenging. Lift or push weights into position for 3 seconds, hold the position for 1 second, and then take 3 seconds to return to your starting position. Breathe out (exhale) during difficult movements, like lifting or pushing weights. Breathe in (inhale) to relax your muscles before the next repetition. Consider alternating arms or legs, especially when you first start strength exercises. Expect some slight muscle soreness after each session. Do strength exercises on 2 or more days a week, for 30 minutes at a time. Avoid exercising the same muscle groups two days in a row. For example, if you work on your leg muscles one day, work on your arm muscles the next day. When you can do two sets of 10-15 repetitions with a certain weight, increase the amount of weight. Balance Balance exercises can help to prevent falls. Balance exercises include: Standing on one foot. Heel-to-toe walk. Balance walk. Tai chi. Make sure you have something sturdy to hold onto while doing balance exercises, such as a sturdy chair. As your balance improves, challenge yourself by holding onto the chair with one hand instead of two, and then with no hands. Trying exercises with your eyes closed also challenges your balance, but be sure to have a sturdy surface (like a countertop) close by in case you need it. Do balance exercises as often as you want, or as often as directed by your health care provider. Strength exercises for the lower body also help to improve balance. Flexibility Flexibility exercises improve how far you can bend, straighten,  move, or rotate parts of your body (range of motion). These exercises also help you to do everyday activities such as getting dressed or reaching for objects. Flexibility exercises include stretching different parts of the body, and they may be done in a standing or seated position or on the floor. When stretching, make sure you: Keep a slight bend in your arms and legs. Avoid completely straightening ("locking") your joints. Do not stretch so far that you feel pain. You should feel a mild stretching feeling. You may try stretching farther as you become more flexible over time. Relax and breathe between stretches. Hold onto something sturdy for balance as needed. Hold each stretch for 10-30 seconds. Repeat each stretch 3-5 times. General safety tips Exercise in well-lit areas. Do not hold your breath during exercises or stretches. Warm up before exercising, and cool down after exercising. This can help prevent injury. Drink plenty of water during exercise or any activity that makes you sweat. Use smooth, steady movements. Do not use sudden, jerking movements, especially when lifting weights or doing flexibility exercises. If you are not sure if an exercise is safe for you, or you are not sure how to do an exercise, talk with your health care provider. This is especially important if you have had surgery on muscles, bones, or joints (orthopedic surgery). Where to find more information You can find  more information about exercise for older adults from: Your local health department, fitness center, or community center. These facilities may have programs for aging adults. [employer] on Aging: http://kim-miller.com/ National Council on Aging: www.ncoa.org Summary Staying physically active is important as you age. Make sure to contact your health care provider before you start any exercise routine. Ask your health care provider what activities are safe for you. Doing endurance, strength, balance,  and flexibility exercises can help to delay or prevent certain diseases, such as heart disease, diabetes, and bone loss (osteoporosis). This information is not intended to replace advice given to you by your health care provider. Make sure you discuss any questions you have with your health care provider. Document Revised: 12/12/2019 Document Reviewed: 12/12/2019 Elsevier Patient Education  [place].          Two Gram Sodium Diet 2000 mg  What is Sodium? Sodium is a mineral found naturally in many foods. The most significant source of sodium in the diet is table salt, which is about 40% sodium.  Processed, convenience, and preserved foods also contain a large amount of sodium.  The body needs only 500 mg of sodium daily to function,  A normal diet provides more than enough sodium even if you do not use salt.  Why Limit Sodium? A build up of sodium in the body can cause thirst, increased blood pressure, shortness of breath, and water retention.  Decreasing sodium in the diet can reduce edema and risk of heart attack or stroke associated with high blood pressure.  Keep in mind that there are many other factors involved in these health problems.  Heredity, obesity, lack of exercise, cigarette smoking, stress and what you eat all play a role.  General Guidelines: Do not add salt at the table or in cooking.  One teaspoon of salt contains over 2 grams of sodium. Read food labels Avoid processed and convenience foods Ask your dietitian before eating any foods not dicussed in the menu planning guidelines Consult your physician if you wish to use a salt substitute or a sodium containing medication such as antacids.  Limit milk and milk products to 16 oz (2 cups) per day.  Shopping Hints: READ LABELS!! "Dietetic" does not necessarily mean low sodium. Salt and other sodium ingredients are often added to foods during processing.    Menu Planning Guidelines Food Group Choose More Often  Avoid  Beverages (see also the milk group All fruit juices, low-sodium, salt-free vegetables juices, low-sodium carbonated beverages Regular vegetable or tomato juices, commercially softened water used for drinking or cooking  Breads and Cereals Enriched white, wheat, rye and pumpernickel bread, hard rolls and dinner rolls; muffins, cornbread and waffles; most dry cereals, cooked cereal without added salt; unsalted crackers and breadsticks; low sodium or homemade bread crumbs Bread, rolls and crackers with salted tops; quick breads; instant hot cereals; pancakes; commercial bread stuffing; self-rising flower and biscuit mixes; regular bread crumbs or cracker crumbs  Desserts and Sweets Desserts and sweets mad with mild should be within allowance Instant pudding mixes and cake mixes  Fats Butter or margarine; vegetable oils; unsalted salad dressings, regular salad dressings limited to 1 Tbs; light, sour and heavy cream Regular salad dressings containing bacon fat, bacon bits, and salt pork; snack dips made with instant soup mixes or processed cheese; salted nuts  Fruits Most fresh, frozen and canned fruits Fruits processed with salt or sodium-containing ingredient (some dried fruits are processed with sodium sulfites  Vegetables Fresh, frozen vegetables and low- sodium canned vegetables Regular canned vegetables, sauerkraut, pickled vegetables, and others prepared in brine; frozen vegetables in sauces; vegetables seasoned with ham, bacon or salt pork  Condiments, Sauces, Miscellaneous  Salt substitute with physician's approval; pepper, herbs, spices; vinegar, lemon or lime juice; hot pepper sauce; garlic powder, onion powder, low sodium soy sauce (1 Tbs.); low sodium condiments (ketchup, chili sauce, mustard) in limited amounts (1 tsp.) fresh ground horseradish; unsalted tortilla chips, pretzels, potato chips, popcorn, salsa (1/4 cup) Any seasoning made with salt including garlic salt, celery  salt, onion salt, and seasoned salt; sea salt, rock salt, kosher salt; meat tenderizers; monosodium glutamate; mustard, regular soy sauce, barbecue, sauce, chili sauce, teriyaki sauce, steak sauce, Worcestershire sauce, and most flavored vinegars; canned gravy and mixes; regular condiments; salted snack foods, olives, picles, relish, horseradish sauce, catsup   Food preparation: Try these seasonings Meats:    Pork Sage, onion Serve with applesauce  Chicken Poultry seasoning, thyme, parsley Serve with cranberry sauce  Lamb Curry powder, rosemary, garlic, thyme Serve with mint sauce or jelly  Veal Marjoram, basil Serve with current jelly, cranberry sauce  Beef Pepper, bay leaf Serve with dry mustard, unsalted chive butter  Fish [place] leaf, dill Serve with unsalted lemon butter, unsalted parsley butter  Vegetables:    Asparagus Lemon juice   Broccoli Lemon juice   Carrots Mustard dressing parsley, mint, nutmeg, glazed with unsalted butter and sugar   Green beans Marjoram, lemon juice, nutmeg,dill seed   Tomatoes Basil, marjoram, onion   Spice /blend for [employer]" 4 tsp ground thyme 1 tsp ground sage 3 tsp ground rosemary 4 tsp ground marjoram   Test your knowledge A product that says "Salt Free" may still contain sodium. True or False Garlic Powder and Hot Pepper Sauce an be used as alternative seasonings.True or False Processed foods have more sodium than fresh foods.  True or False Canned Vegetables have less sodium than froze True or False   WAYS TO DECREASE YOUR SODIUM INTAKE Avoid the use of added salt in cooking and at the table.  Table salt (and other prepared seasonings which contain salt) is probably one of the greatest sources of sodium in the diet.  Unsalted foods can gain flavor from the sweet, sour, and butter taste sensations of herbs and spices.  Instead of using salt for seasoning, try the following seasonings with the foods listed.  Remember: how you use them to enhance  natural food flavors is limited only by your creativity... Allspice-Meat, fish, eggs, fruit, peas, red and yellow vegetables Almond Extract-Fruit baked goods Anise Seed-Sweet breads, fruit, carrots, beets, cottage cheese, cookies (tastes like licorice) Basil-Meat, fish, eggs, vegetables, rice, vegetables salads, soups, sauces Bay Leaf-Meat, fish, stews, poultry Burnet-Salad, vegetables (cucumber-like flavor) Caraway Seed-Bread, cookies, cottage cheese, meat, vegetables, cheese, rice Cardamon-Baked goods, fruit, soups Celery Powder or seed-Salads, salad dressings, sauces, meatloaf, soup, bread.Do not use  celery salt Chervil-Meats, salads, fish, eggs, vegetables, cottage cheese (parsley-like flavor) Chili Power-Meatloaf, chicken cheese, corn, eggplant, egg dishes Chives-Salads cottage cheese, egg dishes, soups, vegetables, sauces Cilantro-Salsa, casseroles Cinnamon-Baked goods, fruit, pork, lamb, chicken, carrots Cloves-Fruit, baked goods, fish, pot roast, green beans, beets, carrots Coriander-Pastry, cookies, meat, salads, cheese (lemon-orange flavor) Cumin-Meatloaf, fish,cheese, eggs, cabbage,fruit pie (caraway flavor) [employer], fruit, eggs, fish, poultry, cottage cheese, vegetables Dill Seed-Meat, cottage cheese, poultry, vegetables, fish, salads, bread Fennel Seed-Bread, cookies, apples, pork, eggs, fish, beets, cabbage, cheese, Licorice-like flavor Garlic-(buds or powder) Salads, meat, poultry, fish,  bread, butter, vegetables, potatoes.Do not  use garlic salt Ginger-Fruit, vegetables, baked goods, meat, fish, poultry Horseradish Root-Meet, vegetables, butter Lemon Juice or Extract-Vegetables, fruit, tea, baked goods, fish salads Mace-Baked goods fruit, vegetables, fish, poultry (taste like nutmeg) Maple Extract-Syrups Marjoram-Meat, chicken, fish, vegetables, breads, green salads (taste like Sage) Mint-Tea, lamb, sherbet, vegetables, desserts, carrots, cabbage Mustard, Dry  or Seed-Cheese, eggs, meats, vegetables, poultry Nutmeg-Baked goods, fruit, chicken, eggs, vegetables, desserts Onion Powder-Meat, fish, poultry, vegetables, cheese, eggs, bread, rice salads (Do not use   Onion salt) Orange Extract-Desserts, baked goods Oregano-Pasta, eggs, cheese, onions, pork, lamb, fish, chicken, vegetables, green salads Paprika-Meat, fish, poultry, eggs, cheese, vegetables Parsley Flakes-Butter, vegetables, meat fish, poultry, eggs, bread, salads (certain forms may   Contain sodium Pepper-Meat fish, poultry, vegetables, eggs Peppermint Extract-Desserts, baked goods Poppy Seed-Eggs, bread, cheese, fruit dressings, baked goods, noodles, vegetables, cottage  [employer], poultry, meat, fish, cauliflower, turnips,eggs bread Saffron-Rice, bread, veal, chicken, fish, eggs Sage-Meat, fish, poultry, onions, eggplant, tomateos, pork, stews Savory-Eggs, salads, poultry, meat, rice, vegetables, soups, pork Tarragon-Meat, poultry, fish, eggs, butter, vegetables (licorice-like flavor)  Thyme-Meat, poultry, fish, eggs, vegetables, (clover-like flavor), sauces, soups Tumeric-Salads, butter, eggs, fish, rice, vegetables (saffron-like flavor) Vanilla Extract-Baked goods, candy Vinegar-Salads, vegetables, meat marinades Walnut Extract-baked goods, candy   2. Choose your Foods Wisely   The following is a list of foods to avoid which are high in sodium:  Meats-Avoid all smoked, canned, salt cured, dried and kosher meat and fish as well as Anchovies   [employer] meats:Bologna, Liverwurst, Pastrami Canned meat or fish  Marinated herring Caviar    Pepperoni Corned Beef   Pizza Dried chipped beef  Salami Frozen breaded fish or meat Salt pork Frankfurters or hot dogs  Sardines Gefilte fish   Sausage Ham (boiled ham, Proscuitto Smoked butt    spiced ham)   Spam      TV Dinners Vegetables Canned vegetables  (Regular) Relish Canned mushrooms  Sauerkraut Olives    Tomato juice Pickles  Bakery and Dessert Products Canned puddings  Cream pies Cheesecake   Decorated cakes Cookies  Beverages/Juices Tomato juice, regular  Gatorade   V-8 vegetable juice, regular  Breads and Cereals Biscuit mixes   Salted potato chips, corn chips, pretzels Bread stuffing mixes  Salted crackers and rolls Pancake and waffle mixes Self-rising flour  Seasonings Accent    Meat sauces Barbecue sauce  Meat tenderizer Catsup    Monosodium glutamate (MSG) Celery salt   Onion salt Chili sauce   Prepared mustard Garlic salt   Salt, seasoned salt, sea salt Gravy mixes   Soy sauce Horseradish   Steak sauce Ketchup   Tartar sauce Lite salt    Teriyaki sauce Marinade mixes   Worcestershire sauce  Others Baking powder   Cocoa and cocoa mixes Baking soda   Commercial casserole mixes Candy-caramels, chocolate  Dehydrated soups    Bars, fudge,nougats  Instant rice and pasta mixes Canned broth or soup  Maraschino cherries Cheese, aged and processed cheese and cheese spreads  Learning Assessment Quiz  Indicated T (for True) or F (for False) for each of the following statements:  _____ Fresh fruits and vegetables and unprocessed grains are generally low in sodium _____ Water may contain a considerable amount of sodium, depending on the source _____ You can always tell if a food is high in sodium by tasting it _____ Certain laxatives my be high in sodium and should be avoided unless prescribed  by a physician or pharmacist _____ Salt substitutes may be used freely by anyone on a sodium restricted diet _____ Sodium is present in table salt, food additives and as a natural component of   most foods _____ Table salt is approximately 90% sodium _____ Limiting sodium intake may help prevent excess fluid accumulation in the body _____ On a sodium-restricted diet, seasonings such as bouillon soy sauce, and    cooking wine  should be used in place of table salt _____ On an ingredient list, a product which lists monosodium glutamate as the first   ingredient is an appropriate food to include on a low sodium diet  Circle the best answer(s) to the following statements (Hint: there may be more than one correct answer)  11. On a low-sodium diet, some acceptable snack items are:    A. Olives  F. Bean dip   K. Grapefruit juice    B. Salted Pretzels G. Commercial Popcorn   L. Canned peaches    C. Carrot Sticks  H. Bouillon   M. Unsalted nuts   D. [place] fries  I. Peanut butter crackers N. Salami   E. Sweet pickles J. Tomato Juice   O. Pizza  12.  Seasonings that may be used freely on a reduced - sodium diet include   A. Lemon wedges F.Monosodium glutamate K. Celery seed    B.Soysauce   G. Pepper   L. Mustard powder   C. Sea salt  H. Cooking wine  M. Onion flakes   D. Vinegar  E. Prepared horseradish N. Salsa   E. Sage   J. Worcestershire sauce  O. Chutney

## 2021-06-04 DIAGNOSIS — G8929 Other chronic pain: Secondary | ICD-10-CM | POA: Diagnosis not present

## 2021-06-04 DIAGNOSIS — S39012A Strain of muscle, fascia and tendon of lower back, initial encounter: Secondary | ICD-10-CM | POA: Diagnosis not present

## 2021-06-04 DIAGNOSIS — Z23 Encounter for immunization: Secondary | ICD-10-CM | POA: Diagnosis not present

## 2021-06-05 DIAGNOSIS — E782 Mixed hyperlipidemia: Secondary | ICD-10-CM | POA: Diagnosis not present

## 2021-06-05 DIAGNOSIS — I10 Essential (primary) hypertension: Secondary | ICD-10-CM | POA: Diagnosis not present

## 2021-06-10 ENCOUNTER — Other Ambulatory Visit: Payer: Self-pay

## 2021-06-10 ENCOUNTER — Encounter: Payer: Self-pay | Admitting: Adult Health

## 2021-06-10 ENCOUNTER — Ambulatory Visit: Payer: Medicare Other | Admitting: Adult Health

## 2021-06-10 VITALS — BP 145/81 | HR 61 | Ht 69.0 in | Wt 226.0 lb

## 2021-06-10 DIAGNOSIS — I639 Cerebral infarction, unspecified: Secondary | ICD-10-CM | POA: Diagnosis not present

## 2021-06-10 DIAGNOSIS — R2689 Other abnormalities of gait and mobility: Secondary | ICD-10-CM

## 2021-06-10 DIAGNOSIS — Z8673 Personal history of transient ischemic attack (TIA), and cerebral infarction without residual deficits: Secondary | ICD-10-CM

## 2021-06-10 DIAGNOSIS — I69398 Other sequelae of cerebral infarction: Secondary | ICD-10-CM

## 2021-06-10 DIAGNOSIS — R42 Dizziness and giddiness: Secondary | ICD-10-CM | POA: Diagnosis not present

## 2021-06-10 NOTE — Progress Notes (Signed)
[facility] [address] [place]. [place] [address] [phone]       HOSPITAL FOLLOW UP NOTE  [name] Date of Birth:  1946-08-21 Medical Record Number:  [id number]   Reason for Referral:  hospital stroke follow up    SUBJECTIVE:   CHIEF COMPLAINT:  Chief Complaint  Patient presents with   Follow-up    RM 3 with spouse patti  Pt is well and stable, no complaints.  States he is having dizziness but started prior to stroke     HPI:   [name] is a 75 y.o. male with history of Htn, Hld and a previous cerebellar stroke who presented on 03/28/2021 after an intense bout of vertigo and lightheadedness associated with bilateral numbness, right > left.  Personally reviewed hospitalization pertinent progress notes, lab work and imaging.  Evaluated by Dr. Erlinda Hong.  Episode of vertigo likely possible BPPV and referred to outpatient vestibular PT.  Incidental finding of small acute/subacute white matter infarct in the left frontal lobe, MCA/ACA watershed area likely secondary to small vessel disease but cannot rule out cardioembolic source.  Evidence of chronic lacunar infarcts in the right thalamic and PLIC as well as chronic microhemorrhages likely secondary to chronic hypertension on MRI.  CTA head/neck no significant stenosis.  EF 60 to 65%.  Recommended 30-day cardiac event monitor to rule out A. fib.  LDL 50.  A1c 6.1 (no hx/dx of DM).  Initiated Plavix 75 mg daily - ASA not recommended due to history of LGIB on ASA.  Resumed home Lozol and Inderal for HTN as well as Zocor for HLD.  PT/OT recommended outpatient therapies and discharged home.  Today, 06/10/2021, Austin Bruce is being seen for hospital follow-up accompanied by his wife, Austin Bruce.  Overall doing well.  Denies new or reoccurring stroke/TIA symptoms.  Chronic gait impairment with imbalance since prior stroke - denies any worsening. Occasional baseline vertigo with use of meclizine with benefit (managed by  PCP).  He has tried to return back to prior activities such as yard work and home improvements (such as painting) but his kids will not allow him to do so.  Also worsening lower back pain limiting - plans to schedule f/u with neuro surg.   Remains on Plavix and simvastatin without side effects.  Blood pressure today 145/81. Routinely monitors - does fluctuate but has been modifying diet and avoiding high sodium foods. 30-day cardiac event monitor completed but only worn for 9 days but did not show evidence of atrial fibrillation.  Has appt with PCP in Nov for physical and lab work.  No further concerns at this time.    PERTINENT IMAGING  CT HEAD 03/28/2021 IMPRESSION: 1. No evidence of acute intracranial abnormality. 2. Severe chronic small vessel ischemic disease. 3. Small chronic cerebellar infarct.  MR BRAIN 03/28/2021 MRA HEAD/NECK IMPRESSION: 1. Small acute or subacute white matter infarct in the left frontal lobe. 2. Severe chronic small vessel ischemic disease with chronic lacunar infarcts in the cerebrum and cerebellum. 3. No major arterial occlusion or convincing flow limiting proximal stenosis in the head and neck within limitations of artifact as detailed above.  2D ECHO  IMPRESSIONS   1. Left ventricular ejection fraction, by estimation, is 60 to 65%. The  left ventricle has normal function. The left ventricle has no regional  wall motion abnormalities. Left ventricular diastolic parameters were  normal.   2. Right ventricular systolic function is normal. The right ventricular  size is  normal. Tricuspid regurgitation signal is inadequate for assessing  PA pressure.   3. The mitral valve is grossly normal. Trivial mitral valve  regurgitation.   4. The aortic valve is tricuspid. Aortic valve regurgitation is not  visualized. Mild aortic valve sclerosis is present, with no evidence of  aortic valve stenosis. Aortic valve mean gradient measures 5.0 mmHg.  CARDIAC  MONITOR Per report by Dr. Rozann Lesches: 9 days analyzed.  Predominant rhythm is sinus with IVCD, heart rate ranging from 53 bpm up to 101 bpm and average heart rate 64 bpm.  No atrial fibrillation was noted.  There were no pauses.        ROS:   14 system review of systems performed and negative with exception of those listed in HPI  PMH:  Past Medical History:  Diagnosis Date   High cholesterol    Hypertension    Stroke ([place])     PSH:  Past Surgical History:  Procedure Laterality Date   HAND SURGERY     HERNIA REPAIR     NOSE SURGERY     TONSILLECTOMY      Social History:  Social History   Socioeconomic History   Marital status: Married    Spouse name: Not on file   Number of children: Not on file   Years of education: Not on file   Highest education level: Not on file  Occupational History   Not on file  Tobacco Use   Smoking status: Former    Types: Cigarettes   Smokeless tobacco: Never  Vaping Use   Vaping Use: Never used  Substance and Sexual Activity   Alcohol use: Not Currently   Drug use: Never   Sexual activity: Not on file  Other Topics Concern   Not on file  Social History Narrative   Not on file   Social Determinants of Health   Financial Resource Strain: Not on file  Food Insecurity: Not on file  Transportation Needs: Not on file  Physical Activity: Not on file  Stress: Not on file  Social Connections: Not on file  Intimate Partner Violence: Not on file    Family History: History reviewed. No pertinent family history.  Medications:   Current Outpatient Medications on File Prior to Visit  Medication Sig Dispense Refill   amLODipine (NORVASC) 10 MG tablet Take 10 mg by mouth daily.     clopidogrel (PLAVIX) 75 MG tablet Take 1 tablet (75 mg total) by mouth daily. 30 tablet 2   finasteride (PROSCAR) 5 MG tablet Take 5 mg by mouth daily.     indapamide (LOZOL) 2.5 MG tablet Take 2.5 mg by mouth daily.     lisinopril (ZESTRIL) 40 MG  tablet Take 40 mg by mouth daily.     meclizine (ANTIVERT) 25 MG tablet Take 25 mg by mouth as needed for dizziness.     Multiple Vitamin (MULTIVITAMIN) tablet Take 1 tablet by mouth daily.     potassium chloride SA (KLOR-CON) 20 MEQ tablet Take 40 mEq by mouth daily.     propranolol (INDERAL) 80 MG tablet Take 80 mg by mouth daily.     simvastatin (ZOCOR) 40 MG tablet Take 40 mg by mouth daily.     traMADol (ULTRAM) 50 MG tablet Take 50 mg by mouth every 6 (six) hours as needed for moderate pain.     No current facility-administered medications on file prior to visit.    Allergies:   Allergies  Allergen Reactions   Penicillins  Anaphylaxis      OBJECTIVE:  Physical Exam  Vitals:   06/10/21 0801  BP: (!) 145/81  Pulse: 61  Weight: 226 lb (102.5 kg)  Height: 5\' 9"  (1.753 m)   Body mass index is 33.37 kg/m. No results found.  Post stroke PHQ 2/9 Depression screen PHQ 2/9 06/10/2021  Decreased Interest 0  Down, Depressed, Hopeless 0  PHQ - 2 Score 0     General: well developed, well nourished, very pleasant elderly Caucasian male, seated, in no evident distress Head: head normocephalic and atraumatic.   Neck: supple with no carotid or supraclavicular bruits Cardiovascular: regular rate and rhythm, no murmurs Musculoskeletal: no deformity Skin:  no rash/petichiae Vascular:  Normal pulses all extremities   Neurologic Exam Mental Status: Awake and fully alert.  Fluent speech and language.  Oriented to place and time. Recent and remote memory intact. Attention span, concentration and fund of knowledge appropriate. Mood and affect appropriate.  Cranial Nerves: Fundoscopic exam reveals sharp disc margins. Pupils equal, briskly reactive to light. Extraocular movements full without nystagmus. Visual fields full to confrontation. Hearing intact. Facial sensation intact. Face, tongue, palate moves normally and symmetrically.  Motor: Normal bulk and tone. Normal strength in all  tested extremity muscles Sensory.: intact to touch , pinprick , position and vibratory sensation.  Coordination: Rapid alternating movements normal in all extremities. Finger-to-nose and heel-to-shin performed accurately bilaterally. Gait and Station: Arises from chair without difficulty. Stance is normal. Gait demonstrates normal stride length and mild imbalance without use of assistive device.  Unable to complete tandem walk and heel toe.  Reflexes: 1+ and symmetric. Toes downgoing.     NIHSS  0 Modified Rankin  0      ASSESSMENT: Austin Bruce is a 75 y.o. year old male with episode of possible BPPV and incidental finding (associated with presenting symptoms) of small acute/subacute laminar infarct in the left frontal lobe and MCA/ACA watershed area likely secondary to small vessel disease but cannot rule out cardioembolic source on 1/61/0960 after presenting with intense bout of vertigo and lightheadedness associated with bilateral numbness. Vascular risk factors include prior strokes on imaging (cerebrum and cerebellum), HTN, HLD, advanced age and former tobacco and EtOH use.      PLAN:  Possible BPPV:  Chronic history of vertigo.   Use of meclizine as needed per PCP.   Discussed participating in vestibular rehab and will call office if interested in pursuing.   History of vertigo 2017 when getting out of bed in the middle of the night and some episodes of laying down in bed at night.   Stroke, incidental finding (as above):  Hx of cerebellar stroke:  Chronic gait impairment with imbalance -discussed exercises to do at home and compensation strategies.  He will call office if interested in working with therapies for further compensation strategies  continue clopidogrel 75 mg daily  and simvastatin 40 mg daily for secondary stroke prevention.   Cardiac event monitor negative for atrial fibrillation Discussed secondary stroke prevention measures and importance of close PCP follow up  for aggressive stroke risk factor management. I have gone over the pathophysiology of stroke, warning signs and symptoms, risk factors and their management in some detail with instructions to go to the closest emergency room for symptoms of concern. HTN: BP goal <130/90.  Stable although slightly on higher end on amlodipine, Lozol, lisinopril and propranolol per PCP.  Discussed dietary recommendations and routine follow-up with PCP for monitoring and management HLD: LDL goal <  70. Recent LDL 50 on simvastatin 40 mg daily.  Plans on repeat lipid panel next month with PCP Pre-DMII: A1c goal<7.0. Recent A1c 6.1.  Plans on repeat lab work next month with PCP    Follow up in 6 months or call earlier if needed   CC:  [place] provider: Dr. Leonie Man PCP: [facility]    I spent 56 minutes of face-to-face and non-face-to-face time with patient and wife.  This included previsit chart review including review of recent hospitalization, lab review, study review, electronic health record documentation, patient and wife education regarding recent stroke including etiology as well as prior stroke and residual deficits, secondary stroke prevention measures and importance of managing stroke risk factors, chronic vertigo and answered all other questions to patient and wife's satisfaction  Frann Rider, AGNP-BC  [facility] [address] [place], [place] [address]  Phone [phone] Fax [phone] Note: This document was prepared with digital dictation and possible smart phrase technology. Any transcriptional errors that result from this process are unintentional.

## 2021-06-10 NOTE — Patient Instructions (Addendum)
Continue clopidogrel 75 mg daily  and simvastatin for secondary stroke prevention  Continue to follow up with PCP regarding cholesterol and blood pressure management  Maintain strict control of hypertension with blood pressure goal below 130/90 and cholesterol with LDL cholesterol (bad cholesterol) goal below 70 mg/dL.   You can return back to all prior activities as tolerated - no restrictions from a stroke standpoint  If you would like to do vestibular therapy please let me know or if you would like to do therapy for imbalance which is likely from your prior stroke     Followup in the future with me in 6 months or call earlier if needed       Thank you for coming to see [place] at [facility]. I hope we have been able to provide you high quality care today.  You may receive a patient satisfaction survey over the next few weeks. We would appreciate your feedback and comments so that we may continue to improve ourselves and the health of our patients.

## 2021-06-15 DIAGNOSIS — H5203 Hypermetropia, bilateral: Secondary | ICD-10-CM | POA: Diagnosis not present

## 2021-06-15 DIAGNOSIS — H25813 Combined forms of age-related cataract, bilateral: Secondary | ICD-10-CM | POA: Diagnosis not present

## 2021-06-15 DIAGNOSIS — H52203 Unspecified astigmatism, bilateral: Secondary | ICD-10-CM | POA: Diagnosis not present

## 2021-06-15 DIAGNOSIS — H524 Presbyopia: Secondary | ICD-10-CM | POA: Diagnosis not present

## 2021-06-25 NOTE — Progress Notes (Signed)
I agree with the above plan 

## 2021-07-01 ENCOUNTER — Encounter (INDEPENDENT_AMBULATORY_CARE_PROVIDER_SITE_OTHER): Payer: Self-pay

## 2021-07-01 DIAGNOSIS — H2511 Age-related nuclear cataract, right eye: Secondary | ICD-10-CM | POA: Diagnosis not present

## 2021-07-01 DIAGNOSIS — H25813 Combined forms of age-related cataract, bilateral: Secondary | ICD-10-CM | POA: Diagnosis not present

## 2021-07-01 HISTORY — PX: CATARACT EXTRACTION: SUR2

## 2021-07-02 ENCOUNTER — Ambulatory Visit (INDEPENDENT_AMBULATORY_CARE_PROVIDER_SITE_OTHER): Payer: Medicare Other | Admitting: Ophthalmology

## 2021-07-02 ENCOUNTER — Encounter (INDEPENDENT_AMBULATORY_CARE_PROVIDER_SITE_OTHER): Payer: Self-pay | Admitting: Ophthalmology

## 2021-07-02 ENCOUNTER — Encounter (INDEPENDENT_AMBULATORY_CARE_PROVIDER_SITE_OTHER): Payer: Medicare Other | Admitting: Ophthalmology

## 2021-07-02 ENCOUNTER — Other Ambulatory Visit: Payer: Self-pay

## 2021-07-02 DIAGNOSIS — T8522XA Displacement of intraocular lens, initial encounter: Secondary | ICD-10-CM

## 2021-07-02 DIAGNOSIS — H2701 Aphakia, right eye: Secondary | ICD-10-CM

## 2021-07-02 DIAGNOSIS — H43821 Vitreomacular adhesion, right eye: Secondary | ICD-10-CM | POA: Diagnosis not present

## 2021-07-02 DIAGNOSIS — H59021 Cataract (lens) fragments in eye following cataract surgery, right eye: Secondary | ICD-10-CM | POA: Diagnosis not present

## 2021-07-02 HISTORY — DX: Aphakia, right eye: H27.01

## 2021-07-02 NOTE — Assessment & Plan Note (Signed)
OD, with dislocated 1 piece acrylic IOL with large posterior capsule opening and dislocation into the posterior chamber inferiorly.  This condition will require surgical intervention with vitrectomy, removal of retained lens fragments visible in the anterior chamber as well as posteriorly as well as removal of posterior implant and insertion of 3 piece IOL into the sulcus  good peripheral anterior capsule rim verified 360 yet the best rim appears to be oriented three-piece IOL from 4-11 meridian

## 2021-07-02 NOTE — Progress Notes (Signed)
07/02/2021     CHIEF COMPLAINT Patient presents for  Chief Complaint  Patient presents with   Retina Evaluation      HISTORY OF PRESENT ILLNESS: Austin Bruce is a 75 y.o. male who presents to the clinic today for:   HPI     Retina Evaluation   In right eye.  This started 1 day ago.  Duration of 1 day.  Associated Symptoms Floaters.  Context:  distance vision, mid-range vision and near vision.  Treatments tried include surgery.        Comments   NP referred from Dr. Gershon Crane for dislocated IOL OD, 1 day PO s/p CE/IOL.  EyeMeds: Did not use today Ofloxacin TID OD Pred TID OD Ketorolac TID OD      Last edited by Reather Littler, COA on 07/02/2021  8:59 AM.      Referring physician: Rutherford Guys, [place],  [place] [id number]  HISTORICAL INFORMATION:   Selected notes from the MEDICAL RECORD NUMBER    Lab Results  Component Value Date   HGBA1C 6.1 (H) 03/28/2021     CURRENT MEDICATIONS: No current outpatient medications on file. (Ophthalmic Drugs)   No current facility-administered medications for this visit. (Ophthalmic Drugs)   Current Outpatient Medications (Other)  Medication Sig   amLODipine (NORVASC) 10 MG tablet Take 10 mg by mouth daily.   clopidogrel (PLAVIX) 75 MG tablet Take 1 tablet (75 mg total) by mouth daily.   finasteride (PROSCAR) 5 MG tablet Take 5 mg by mouth daily.   indapamide (LOZOL) 2.5 MG tablet Take 2.5 mg by mouth daily.   lisinopril (ZESTRIL) 40 MG tablet Take 40 mg by mouth daily.   meclizine (ANTIVERT) 25 MG tablet Take 25 mg by mouth as needed for dizziness.   Multiple Vitamin (MULTIVITAMIN) tablet Take 1 tablet by mouth daily.   potassium chloride SA (KLOR-CON) 20 MEQ tablet Take 40 mEq by mouth daily.   propranolol (INDERAL) 80 MG tablet Take 80 mg by mouth daily.   simvastatin (ZOCOR) 40 MG tablet Take 40 mg by mouth daily.   traMADol (ULTRAM) 50 MG tablet Take 50 mg by mouth every 6 (six) hours as  needed for moderate pain.   No current facility-administered medications for this visit. (Other)      REVIEW OF SYSTEMS:    ALLERGIES Allergies  Allergen Reactions   Penicillins Anaphylaxis    PAST MEDICAL HISTORY Past Medical History:  Diagnosis Date   High cholesterol    Hypertension    Stroke [facility])    Past Surgical History:  Procedure Laterality Date   CATARACT EXTRACTION Right 07/01/2021   Dislocated IOL   HAND SURGERY     HERNIA REPAIR     NOSE SURGERY     TONSILLECTOMY      FAMILY HISTORY History reviewed. No pertinent family history.  SOCIAL HISTORY Social History   Tobacco Use   Smoking status: Former    Types: Cigarettes   Smokeless tobacco: Never  Vaping Use   Vaping Use: Never used  Substance Use Topics   Alcohol use: Not Currently   Drug use: Never         OPHTHALMIC EXAM:  Base Eye Exam     Visual Acuity (ETDRS)       Right Left   Dist [place] CF@face  20/20   Dist ph [place] 20/160          Tonometry (Tonopen, 9:07 AM)  Right Left   Pressure 30 17         Pupils       Pupils Dark Light Shape React APD   Right PERRL Dilated  Irregular Minimal None   Left PERRL 3.5 2.5 Round Brisk None         Visual Fields       Left Right    Full    Restrictions  Partial inner superior temporal, inferior temporal deficiencies         Extraocular Movement       Right Left    Full, Ortho Full, Ortho         Neuro/Psych     Oriented x3: Yes   Mood/Affect: Normal         Dilation     Both eyes: 1.0% Mydriacyl, 2.5% Phenylephrine @ 9:08 AM           Slit Lamp and Fundus Exam     External Exam       Right Left   External Normal Normal         Slit Lamp Exam       Right Left   Lids/Lashes Normal Normal   Conjunctiva/Sclera White and quiet White and quiet   Cornea Clear Clear   Anterior Chamber Deep, formed, 1+ Cell, retained lens fragments, cortical inferior sulcus Deep and quiet   Iris Round and  reactive Round and reactive   Lens Central capsule opening, good peripheral anterior capsule rim verified 360 yet the best rim appears to be oriented three-piece IOL from 4-11 meridian,,, with small retained lens fragment inferotemporal peripheral capsule 2+ Nuclear sclerosis   Anterior Vitreous Normal Normal         Fundus Exam       Right Left   Posterior Vitreous Dislocated intraocular lens implant posteriorly inferiorly with minor fragments Normal   Disc Normal Normal   C/D Ratio 0.25 0.25   Macula Normal Normal   Vessels Normal Normal   Periphery Normal Normal            IMAGING AND PROCEDURES  Imaging and Procedures for 07/02/21  OCT, Retina - OU - Both Eyes       Right Eye Quality was good. Scan locations included subfoveal. Central Foveal Thickness: 290. Findings include normal foveal contour.   Left Eye Quality was good. Scan locations included subfoveal. Central Foveal Thickness: 273. Findings include normal foveal contour.   Notes Diffuse vitreous debris over the fovea.  Vitreous is a attached.  OD.  OS normal     Color Fundus Photography Optos - OU - Both Eyes       Right Eye Progression has no prior data. Disc findings include normal observations. Macula : normal observations. Vessels : normal observations. Periphery : normal observations.   Left Eye Progression has no prior data. Disc findings include normal observations. Macula : normal observations. Vessels : normal observations. Periphery : normal observations.   Notes Retained lens fragments with vitreous opacifications OD.  Small segment of lens noted inferior.  OS, normal     B-Scan Ultrasound - OD - Right Eye       Quality was good. Findings included vitreous opacities.   Notes Retained lens fragments diffusely, and posterior implant of the inferior retinal periphery             ASSESSMENT/PLAN:  Dislocated IOL (intraocular lens), posterior, right OD, with dislocated 1  piece acrylic IOL with large posterior capsule opening and  dislocation into the posterior chamber inferiorly.  This condition will require surgical intervention with vitrectomy, removal of retained lens fragments visible in the anterior chamber as well as posteriorly as well as removal of posterior implant and insertion of 3 piece IOL into the sulcus  good peripheral anterior capsule rim verified 360 yet the best rim appears to be oriented three-piece IOL from 4-11 meridian  Cataract (lens) fragments in eye following cataract surgery, right eye OD, will require vitrectomy concomitant with posterior implant removal and insertion of secondary three-piece IOL into the sulcus OD  Vitreomacular adhesion of right eye Plan release of the posterior hyaloid at time of vitrectomy to clear the vitreous debris, retained lens fragments and retrieve the posterior dislocated implant  Aphakia, right eye Will need vitrectomy, removal of retained lens fragments, and intraocular lens removal, and secondary intraocular lens insertion into the sulcus     ICD-10-CM   1. Dislocated IOL (intraocular lens), posterior, right  T85.22XA B-Scan Ultrasound - OD - Right Eye    2. Cataract (lens) fragments in eye following cataract surgery, right eye  H59.021 Color Fundus Photography Optos - OU - Both Eyes    3. Vitreomacular adhesion of right eye  H43.821 OCT, Retina - OU - Both Eyes    4. Aphakia, right eye  H27.01       OD with a aphakia now with dislocated intraocular lens 1 day status post cataract surgery with ruptured posterior capsule and dislocation of 1 piece IOL set posteriorly.  2.  OD will require surgical intervention to remove retained lens fragments as well as recover dislocated 1 piece IOL and extracted followed thereafter by insertion of 3 piece IOL into the sulcus  3.  Risk benefits reviewed, and the need for surgical intervention reviewed using booklets with the patient and family  Ophthalmic  Meds Ordered this visit:  No orders of the defined types were placed in this encounter.      Return ,, [facility], for Schedule vitrectomy, removal dislocated IOL, insert secondary IOL three-piece sulcus, OD.  There are no Patient Instructions on file for this visit.   Explained the diagnoses, plan, and follow up with the patient and they expressed understanding.  Patient expressed understanding of the importance of proper follow up care.   Clent Demark Melvina Pangelinan M.D. Diseases & Surgery of the Retina and Vitreous Retina [facility] Diabetic [place] 07/02/21     Abbreviations: M myopia (nearsighted); A astigmatism; H hyperopia (farsighted); P presbyopia; Mrx spectacle prescription;  CTL contact lenses; OD right eye; OS left eye; OU both eyes  XT exotropia; ET esotropia; PEK punctate epithelial keratitis; PEE punctate epithelial erosions; DES dry eye syndrome; MGD meibomian gland dysfunction; ATs artificial tears; PFAT's preservative free artificial tears; [place] nuclear sclerotic cataract; PSC posterior subcapsular cataract; ERM epi-retinal membrane; PVD posterior vitreous detachment; RD retinal detachment; DM diabetes mellitus; DR diabetic retinopathy; NPDR non-proliferative diabetic retinopathy; PDR proliferative diabetic retinopathy; CSME clinically significant macular edema; DME diabetic macular edema; dbh dot blot hemorrhages; CWS cotton wool spot; POAG primary open angle glaucoma; C/D cup-to-disc ratio; HVF humphrey visual field; GVF goldmann visual field; OCT optical coherence tomography; IOP intraocular pressure; BRVO Branch retinal vein occlusion; CRVO central retinal vein occlusion; CRAO central retinal artery occlusion; BRAO branch retinal artery occlusion; RT retinal tear; SB scleral buckle; PPV pars plana vitrectomy; VH Vitreous hemorrhage; PRP panretinal laser photocoagulation; IVK intravitreal kenalog; VMT vitreomacular traction; MH Macular hole;  NVD neovascularization of the disc;  NVE neovascularization  elsewhere; AREDS age related eye disease study; ARMD age related macular degeneration; POAG primary open angle glaucoma; EBMD epithelial/anterior basement membrane dystrophy; ACIOL anterior chamber intraocular lens; IOL intraocular lens; PCIOL posterior chamber intraocular lens; Phaco/IOL phacoemulsification with intraocular lens placement; [place] photorefractive keratectomy; LASIK laser assisted in situ keratomileusis; HTN hypertension; DM diabetes mellitus; COPD chronic obstructive pulmonary disease

## 2021-07-02 NOTE — Assessment & Plan Note (Signed)
Will need vitrectomy, removal of retained lens fragments, and intraocular lens removal, and secondary intraocular lens insertion into the sulcus

## 2021-07-02 NOTE — Assessment & Plan Note (Signed)
Plan release of the posterior hyaloid at time of vitrectomy to clear the vitreous debris, retained lens fragments and retrieve the posterior dislocated implant

## 2021-07-02 NOTE — Assessment & Plan Note (Signed)
OD, will require vitrectomy concomitant with posterior implant removal and insertion of secondary three-piece IOL into the sulcus OD

## 2021-07-03 ENCOUNTER — Encounter (AMBULATORY_SURGERY_CENTER): Payer: Medicare Other | Admitting: Ophthalmology

## 2021-07-03 DIAGNOSIS — T8522XA Displacement of intraocular lens, initial encounter: Secondary | ICD-10-CM

## 2021-07-03 DIAGNOSIS — H2701 Aphakia, right eye: Secondary | ICD-10-CM

## 2021-07-03 DIAGNOSIS — H59021 Cataract (lens) fragments in eye following cataract surgery, right eye: Secondary | ICD-10-CM | POA: Diagnosis not present

## 2021-07-04 ENCOUNTER — Ambulatory Visit (INDEPENDENT_AMBULATORY_CARE_PROVIDER_SITE_OTHER): Payer: Medicare Other | Admitting: Ophthalmology

## 2021-07-04 ENCOUNTER — Encounter (INDEPENDENT_AMBULATORY_CARE_PROVIDER_SITE_OTHER): Payer: Self-pay | Admitting: Ophthalmology

## 2021-07-04 DIAGNOSIS — H2701 Aphakia, right eye: Secondary | ICD-10-CM

## 2021-07-04 DIAGNOSIS — H59021 Cataract (lens) fragments in eye following cataract surgery, right eye: Secondary | ICD-10-CM

## 2021-07-04 DIAGNOSIS — T8522XA Displacement of intraocular lens, initial encounter: Secondary | ICD-10-CM

## 2021-07-04 DIAGNOSIS — H43821 Vitreomacular adhesion, right eye: Secondary | ICD-10-CM

## 2021-07-04 NOTE — Assessment & Plan Note (Signed)
OD, aphakic condition now corrected pseudophakic with a 3 piece IOL in the sulcus well centered today

## 2021-07-04 NOTE — Assessment & Plan Note (Signed)
Postop day #1 no retained lens fragments remaining anterior chamber fragments now cleared

## 2021-07-04 NOTE — Progress Notes (Signed)
07/04/2021     CHIEF COMPLAINT Patient presents for  Chief Complaint  Patient presents with   Post-op Follow-up      HISTORY OF PRESENT ILLNESS: Austin Bruce is a 75 y.o. male who presents to the clinic today for:   HPI     Post-op Follow-up   In right eye.        Comments   Postop day #1 status post vitrectomy, surgical removal of posterior implant dislocation, IOL removal, secondary insertion of 3 piece IOL in the sulcus.   No significant pain or discomfort experienced last night.      Last edited by Hurman Horn, MD on 07/04/2021  8:09 AM.      Referring physician: No referring provider defined for this encounter.  HISTORICAL INFORMATION:   Selected notes from the MEDICAL RECORD NUMBER    Lab Results  Component Value Date   HGBA1C 6.1 (H) 03/28/2021     CURRENT MEDICATIONS: No current outpatient medications on file. (Ophthalmic Drugs)   No current facility-administered medications for this visit. (Ophthalmic Drugs)   Current Outpatient Medications (Other)  Medication Sig   amLODipine (NORVASC) 10 MG tablet Take 10 mg by mouth daily.   clopidogrel (PLAVIX) 75 MG tablet Take 1 tablet (75 mg total) by mouth daily.   finasteride (PROSCAR) 5 MG tablet Take 5 mg by mouth daily.   indapamide (LOZOL) 2.5 MG tablet Take 2.5 mg by mouth daily.   lisinopril (ZESTRIL) 40 MG tablet Take 40 mg by mouth daily.   meclizine (ANTIVERT) 25 MG tablet Take 25 mg by mouth as needed for dizziness.   Multiple Vitamin (MULTIVITAMIN) tablet Take 1 tablet by mouth daily.   potassium chloride SA (KLOR-CON) 20 MEQ tablet Take 40 mEq by mouth daily.   propranolol (INDERAL) 80 MG tablet Take 80 mg by mouth daily.   simvastatin (ZOCOR) 40 MG tablet Take 40 mg by mouth daily.   traMADol (ULTRAM) 50 MG tablet Take 50 mg by mouth every 6 (six) hours as needed for moderate pain.   No current facility-administered medications for this visit. (Other)      REVIEW OF  SYSTEMS:    ALLERGIES Allergies  Allergen Reactions   Penicillins Anaphylaxis    PAST MEDICAL HISTORY Past Medical History:  Diagnosis Date   High cholesterol    Hypertension    Stroke [facility])    Past Surgical History:  Procedure Laterality Date   CATARACT EXTRACTION Right 07/01/2021   Dislocated IOL   HAND SURGERY     HERNIA REPAIR     NOSE SURGERY     TONSILLECTOMY      FAMILY HISTORY History reviewed. No pertinent family history.  SOCIAL HISTORY Social History   Tobacco Use   Smoking status: Former    Types: Cigarettes   Smokeless tobacco: Never  Vaping Use   Vaping Use: Never used  Substance Use Topics   Alcohol use: Not Currently   Drug use: Never         OPHTHALMIC EXAM:  Base Eye Exam     Visual Acuity (ETDRS)       Right Left   Dist [place] 20/100    Dist ph [place] 20/40          Tonometry (Tonopen, 8:14 AM)       Right Left   Pressure 24            Slit Lamp and Fundus Exam     External Exam  Right Left   External Mild edema          Slit Lamp Exam       Right Left   Lids/Lashes Normal    Conjunctiva/Sclera 2+ Injection    Cornea Trace epithelial edema temporally    Anterior Chamber Deep and quiet    Iris Postsurgical irregular contour to iris inferotemporal, pharmacologically dilated    Lens IOL in the sulcus well centered    Anterior Vitreous Clear vitreous          Fundus Exam       Right Left   Posterior Vitreous Clear    Disc Normal    C/D Ratio 0.15    Macula Normal    Vessels Normal    Periphery Normal             IMAGING AND PROCEDURES  Imaging and Procedures for 07/04/21           ASSESSMENT/PLAN:  Dislocated IOL (intraocular lens), posterior, right Ofloxacin  4 times daily to the operative eye  Prednisolone acetate 1 drop to the operative eye 4 times daily  Patient instructed not to refill the medications and use them for maximum of 3 weeks.  Patient instructed do not rub the  eye.  Patient has the option to use the patch at night.   Cataract (lens) fragments in eye following cataract surgery, right eye Postop day #1 no retained lens fragments remaining anterior chamber fragments now cleared  Aphakia, right eye OD, aphakic condition now corrected pseudophakic with a 3 piece IOL in the sulcus well centered today  Vitreomacular adhesion of right eye At time of surgery PVD not created, very taut adherence     ICD-10-CM   1. Dislocated IOL (intraocular lens), posterior, right  T85.22XA     2. Cataract (lens) fragments in eye following cataract surgery, right eye  H59.021     3. Aphakia, right eye  H27.01     4. Vitreomacular adhesion of right eye  H43.821       1.  OD, looks great postoperative day #1 with improved acuity.  Vitreous is clear, retained lens fragments in the anterior chamber in the posterior chamber been cleared.  A phakic condition has been corrected with insertion of sulcus fixated three-piece IOL.  2.  Mild corneal edema OD temporally postsurgical in nature wound secure.  3.  Patient instructed to resume topical medications each bottle 3 times daily as previously instructed  Ophthalmic Meds Ordered this visit:  No orders of the defined types were placed in this encounter.      Return in about 4 days (around 07/08/2021) for dilate, OD, POST OP.  There are no Patient Instructions on file for this visit.   Explained the diagnoses, plan, and follow up with the patient and they expressed understanding.  Patient expressed understanding of the importance of proper follow up care.   [name] M.D. Diseases & Surgery of the Retina and Vitreous Retina & Diabetic [place] 07/04/21     Abbreviations: M myopia (nearsighted); A astigmatism; H hyperopia (farsighted); P presbyopia; Mrx spectacle prescription;  CTL contact lenses; OD right eye; OS left eye; OU both eyes  XT exotropia; ET esotropia; PEK punctate epithelial keratitis;  PEE punctate epithelial erosions; DES dry eye syndrome; MGD meibomian gland dysfunction; ATs artificial tears; PFAT's preservative free artificial tears; [place] nuclear sclerotic cataract; PSC posterior subcapsular cataract; ERM epi-retinal membrane; PVD posterior vitreous detachment; RD retinal detachment; DM diabetes mellitus; DR  diabetic retinopathy; NPDR non-proliferative diabetic retinopathy; PDR proliferative diabetic retinopathy; CSME clinically significant macular edema; DME diabetic macular edema; dbh dot blot hemorrhages; CWS cotton wool spot; POAG primary open angle glaucoma; C/D cup-to-disc ratio; HVF humphrey visual field; GVF goldmann visual field; OCT optical coherence tomography; IOP intraocular pressure; BRVO Branch retinal vein occlusion; CRVO central retinal vein occlusion; CRAO central retinal artery occlusion; BRAO branch retinal artery occlusion; RT retinal tear; SB scleral buckle; PPV pars plana vitrectomy; VH Vitreous hemorrhage; PRP panretinal laser photocoagulation; IVK intravitreal kenalog; VMT vitreomacular traction; MH Macular hole;  NVD neovascularization of the disc; NVE neovascularization elsewhere; AREDS age related eye disease study; ARMD age related macular degeneration; POAG primary open angle glaucoma; EBMD epithelial/anterior basement membrane dystrophy; ACIOL anterior chamber intraocular lens; IOL intraocular lens; PCIOL posterior chamber intraocular lens; Phaco/IOL phacoemulsification with intraocular lens placement; [place] photorefractive keratectomy; LASIK laser assisted in situ keratomileusis; HTN hypertension; DM diabetes mellitus; COPD chronic obstructive pulmonary disease

## 2021-07-04 NOTE — Assessment & Plan Note (Signed)
Ofloxacin  4 times daily to the operative eye  Prednisolone acetate 1 drop to the operative eye 4 times daily  Patient instructed not to refill the medications and use them for maximum of 3 weeks.  Patient instructed do not rub the eye.  Patient has the option to use the patch at night. 

## 2021-07-04 NOTE — Assessment & Plan Note (Signed)
At time of surgery PVD not created, very taut adherence

## 2021-07-08 ENCOUNTER — Other Ambulatory Visit: Payer: Self-pay

## 2021-07-08 ENCOUNTER — Ambulatory Visit (INDEPENDENT_AMBULATORY_CARE_PROVIDER_SITE_OTHER): Payer: Medicare Other | Admitting: Ophthalmology

## 2021-07-08 ENCOUNTER — Encounter (INDEPENDENT_AMBULATORY_CARE_PROVIDER_SITE_OTHER): Payer: Self-pay | Admitting: Ophthalmology

## 2021-07-08 DIAGNOSIS — H5702 Anisocoria: Secondary | ICD-10-CM

## 2021-07-08 DIAGNOSIS — H59021 Cataract (lens) fragments in eye following cataract surgery, right eye: Secondary | ICD-10-CM

## 2021-07-08 DIAGNOSIS — H2701 Aphakia, right eye: Secondary | ICD-10-CM

## 2021-07-08 NOTE — Progress Notes (Signed)
07/08/2021     CHIEF COMPLAINT Patient presents for  Chief Complaint  Patient presents with   Routine Post Op      HISTORY OF PRESENT ILLNESS: Austin Bruce is a 75 y.o. male who presents to the clinic today for:   HPI   Post Op day 5 OD; (Sx: 07/03/21)  Pt c/o tenderness to the touch in the right eye. Pt c/o blurry vision in the right eye.  EyeMeds: Ofloxacin TID OD Prednisolone TID OD Ketorolac TID OD Last edited by Austin Bruce, COA on 07/08/2021  8:11 AM.      Referring physician: Practice, [facility] [place],  Austin Bruce  HISTORICAL INFORMATION:   Selected notes from the MEDICAL RECORD NUMBER    Lab Results  Component Value Date   HGBA1C 6.1 (H) 03/28/2021     CURRENT MEDICATIONS: No current outpatient medications on file. (Ophthalmic Drugs)   No current facility-administered medications for this visit. (Ophthalmic Drugs)   Current Outpatient Medications (Other)  Medication Sig   amLODipine (NORVASC) 10 MG tablet Take 10 mg by mouth daily.   clopidogrel (PLAVIX) 75 MG tablet Take 1 tablet (75 mg total) by mouth daily.   finasteride (PROSCAR) 5 MG tablet Take 5 mg by mouth daily.   indapamide (LOZOL) 2.5 MG tablet Take 2.5 mg by mouth daily.   lisinopril (ZESTRIL) 40 MG tablet Take 40 mg by mouth daily.   meclizine (ANTIVERT) 25 MG tablet Take 25 mg by mouth as needed for dizziness.   Multiple Vitamin (MULTIVITAMIN) tablet Take 1 tablet by mouth daily.   potassium chloride SA (KLOR-CON) 20 MEQ tablet Take 40 mEq by mouth daily.   propranolol (INDERAL) 80 MG tablet Take 80 mg by mouth daily.   simvastatin (ZOCOR) 40 MG tablet Take 40 mg by mouth daily.   traMADol (ULTRAM) 50 MG tablet Take 50 mg by mouth every 6 (six) hours as needed for moderate pain.   No current facility-administered medications for this visit. (Other)      REVIEW OF SYSTEMS:    ALLERGIES Allergies  Allergen Reactions   Penicillins Anaphylaxis     PAST MEDICAL HISTORY Past Medical History:  Diagnosis Date   High cholesterol    Hypertension    Stroke [facility])    Past Surgical History:  Procedure Laterality Date   CATARACT EXTRACTION Right 07/01/2021   Dislocated IOL   HAND SURGERY     HERNIA REPAIR     NOSE SURGERY     TONSILLECTOMY      FAMILY HISTORY History reviewed. No pertinent family history.  SOCIAL HISTORY Social History   Tobacco Use   Smoking status: Former    Types: Cigarettes   Smokeless tobacco: Never  Vaping Use   Vaping Use: Never used  Substance Use Topics   Alcohol use: Not Currently   Drug use: Never         OPHTHALMIC EXAM:  Base Eye Exam     Visual Acuity (ETDRS)       Right Left   Dist [place] 20/60 -2    Dist [place]  20/20   Dist ph [place] 20/40     Correction: Glasses         Tonometry (Tonopen, 8:18 AM)       Right Left   Pressure 15 9         Pupils       Shape React APD   Right Irregular Minimal None  Left Round Brisk None         Visual Fields       Left Right    Full Full         Extraocular Movement       Right Left    Full, Ortho Full, Ortho         Neuro/Psych     Oriented x3: Yes   Mood/Affect: Normal         Dilation     Right eye: 1.0% Mydriacyl, 2.5% Phenylephrine @ 8:18 AM           Slit Lamp and Fundus Exam     External Exam       Right Left   External Mild edema          Slit Lamp Exam       Right Left   Lids/Lashes Normal    Conjunctiva/Sclera 1+ Injection    Cornea Clear, 3 sutures secure wound temporally    Anterior Chamber Deep and quiet    Iris Postsurgical irregular contour to iris inferotemporal, pharmacologically dilated    Lens IOL in the sulcus well centered    Anterior Vitreous Clear vitreous          Fundus Exam       Right Left   Posterior Vitreous Clear, avitric    Disc Normal    C/D Ratio 0.15    Macula Normal    Vessels Normal    Periphery Normal             IMAGING AND  PROCEDURES  Imaging and Procedures for 07/08/21           ASSESSMENT/PLAN:  Cataract (lens) fragments in eye following cataract surgery, right eye No retained lens fragments remain looks great  Aphakia, right eye OD much improved post vitrectomy insertion of 3 piece IOL in the sulcus  Anisocoria Pupillary defect found at the time of vitrectomy and removal of retained lens fragment as it was covered by retained lens fragment in the anterior chamber, stable no change I explained to the patient and family that this will have ultimately no visual acuity impact but will look different when he looks at the eye in the mirror or someone also use it     ICD-10-CM   1. Cataract (lens) fragments in eye following cataract surgery, right eye  H59.021     2. Aphakia, right eye  H27.01     3. Anisocoria  H57.02       1.  OD looks great with restoration of pseudophakic state right eye.  Visual acuity likely continue to improve.  2.  Patient asking about driving, have asked the patient to seek reevaluation Dr. Rutherford Bruce to seek consideration of balancing of the current refraction between the phakic left eye in the pseudophakic right eye  3.  I explained the patient his visual acuity is likely improved but he still may need some residual glasses assistance in the right eye because of the less precise nature replacing of lens and refractive power OD  Ophthalmic Meds Ordered this visit:  No orders of the defined types were placed in this encounter.      Return in about 5 weeks (around 08/12/2021) for dilate, OCT, COLOR FP.  There are no Patient Instructions on file for this visit.   Explained the diagnoses, plan, and follow up with the patient and they expressed understanding.  Patient expressed understanding of the importance of proper follow up  care.   Austin Bruce. Austin Bruce M.D. Diseases & Surgery of the Retina and Vitreous Retina & Diabetic [place] 07/08/21     Abbreviations: M  myopia (nearsighted); A astigmatism; H hyperopia (farsighted); P presbyopia; Mrx spectacle prescription;  CTL contact lenses; OD right eye; OS left eye; OU both eyes  XT exotropia; ET esotropia; PEK punctate epithelial keratitis; PEE punctate epithelial erosions; DES dry eye syndrome; MGD meibomian gland dysfunction; ATs artificial tears; PFAT's preservative free artificial tears; [place] nuclear sclerotic cataract; PSC posterior subcapsular cataract; ERM epi-retinal membrane; PVD posterior vitreous detachment; RD retinal detachment; DM diabetes mellitus; DR diabetic retinopathy; NPDR non-proliferative diabetic retinopathy; PDR proliferative diabetic retinopathy; CSME clinically significant macular edema; DME diabetic macular edema; dbh dot blot hemorrhages; CWS cotton wool spot; POAG primary open angle glaucoma; C/D cup-to-disc ratio; HVF humphrey visual field; GVF goldmann visual field; OCT optical coherence tomography; IOP intraocular pressure; BRVO Branch retinal vein occlusion; CRVO central retinal vein occlusion; CRAO central retinal artery occlusion; BRAO branch retinal artery occlusion; RT retinal tear; SB scleral buckle; PPV pars plana vitrectomy; VH Vitreous hemorrhage; PRP panretinal laser photocoagulation; IVK intravitreal kenalog; VMT vitreomacular traction; MH Macular hole;  NVD neovascularization of the disc; NVE neovascularization elsewhere; AREDS age related eye disease study; ARMD age related macular degeneration; POAG primary open angle glaucoma; EBMD epithelial/anterior basement membrane dystrophy; ACIOL anterior chamber intraocular lens; IOL intraocular lens; PCIOL posterior chamber intraocular lens; Phaco/IOL phacoemulsification with intraocular lens placement; [place] photorefractive keratectomy; LASIK laser assisted in situ keratomileusis; HTN hypertension; DM diabetes mellitus; COPD chronic obstructive pulmonary disease

## 2021-07-08 NOTE — Assessment & Plan Note (Signed)
OD much improved post vitrectomy insertion of 3 piece IOL in the sulcus

## 2021-07-08 NOTE — Patient Instructions (Signed)
Continue on prednisolone acetate 1 drop right eye 3times daily in addition to antibiotic 1 drop right eye 3 times daily as well as topical and topical NSAID 1 drop 3 times daily to the right eye

## 2021-07-08 NOTE — Assessment & Plan Note (Signed)
No retained lens fragments remain looks great

## 2021-07-08 NOTE — Assessment & Plan Note (Signed)
Pupillary defect found at the time of vitrectomy and removal of retained lens fragment as it was covered by retained lens fragment in the anterior chamber, stable no change I explained to the patient and family that this will have ultimately no visual acuity impact but will look different when he looks at the eye in the mirror or someone also use it

## 2021-07-24 DIAGNOSIS — E7849 Other hyperlipidemia: Secondary | ICD-10-CM | POA: Diagnosis not present

## 2021-07-24 DIAGNOSIS — E876 Hypokalemia: Secondary | ICD-10-CM | POA: Diagnosis not present

## 2021-07-24 DIAGNOSIS — E782 Mixed hyperlipidemia: Secondary | ICD-10-CM | POA: Diagnosis not present

## 2021-07-24 DIAGNOSIS — Z0001 Encounter for general adult medical examination with abnormal findings: Secondary | ICD-10-CM | POA: Diagnosis not present

## 2021-07-29 DIAGNOSIS — U071 COVID-19: Secondary | ICD-10-CM | POA: Diagnosis not present

## 2021-07-29 DIAGNOSIS — Z8673 Personal history of transient ischemic attack (TIA), and cerebral infarction without residual deficits: Secondary | ICD-10-CM | POA: Diagnosis not present

## 2021-07-29 DIAGNOSIS — Z0000 Encounter for general adult medical examination without abnormal findings: Secondary | ICD-10-CM | POA: Diagnosis not present

## 2021-07-29 DIAGNOSIS — M545 Low back pain, unspecified: Secondary | ICD-10-CM | POA: Diagnosis not present

## 2021-07-29 DIAGNOSIS — E7849 Other hyperlipidemia: Secondary | ICD-10-CM | POA: Diagnosis not present

## 2021-07-29 DIAGNOSIS — H6121 Impacted cerumen, right ear: Secondary | ICD-10-CM | POA: Diagnosis not present

## 2021-07-29 DIAGNOSIS — E782 Mixed hyperlipidemia: Secondary | ICD-10-CM | POA: Diagnosis not present

## 2021-07-29 DIAGNOSIS — Z131 Encounter for screening for diabetes mellitus: Secondary | ICD-10-CM | POA: Diagnosis not present

## 2021-07-29 DIAGNOSIS — I10 Essential (primary) hypertension: Secondary | ICD-10-CM | POA: Diagnosis not present

## 2021-08-12 ENCOUNTER — Other Ambulatory Visit: Payer: Self-pay

## 2021-08-12 ENCOUNTER — Ambulatory Visit (INDEPENDENT_AMBULATORY_CARE_PROVIDER_SITE_OTHER): Payer: Medicare Other | Admitting: Ophthalmology

## 2021-08-12 ENCOUNTER — Encounter (INDEPENDENT_AMBULATORY_CARE_PROVIDER_SITE_OTHER): Payer: Self-pay | Admitting: Ophthalmology

## 2021-08-12 DIAGNOSIS — H31421 Serous choroidal detachment, right eye: Secondary | ICD-10-CM | POA: Diagnosis not present

## 2021-08-12 DIAGNOSIS — H59021 Cataract (lens) fragments in eye following cataract surgery, right eye: Secondary | ICD-10-CM

## 2021-08-12 DIAGNOSIS — R0683 Snoring: Secondary | ICD-10-CM

## 2021-08-12 DIAGNOSIS — T8522XA Displacement of intraocular lens, initial encounter: Secondary | ICD-10-CM

## 2021-08-12 DIAGNOSIS — H43821 Vitreomacular adhesion, right eye: Secondary | ICD-10-CM

## 2021-08-12 HISTORY — DX: Serous choroidal detachment, right eye: H31.421

## 2021-08-12 MED ORDER — ATROPINE SULFATE 1 % OP SOLN
1.0000 [drp] | Freq: Two times a day (BID) | OPHTHALMIC | 3 refills | Status: DC
Start: 2021-08-12 — End: 2021-08-12

## 2021-08-12 MED ORDER — ATROPINE SULFATE 1 % OP SOLN: 1.0000 [drp] | Freq: Two times a day (BID) | OPHTHALMIC | 3 refills | Status: DC

## 2021-08-12 NOTE — Assessment & Plan Note (Signed)
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

## 2021-08-12 NOTE — Progress Notes (Signed)
08/12/2021     CHIEF COMPLAINT Patient presents for  Chief Complaint  Patient presents with   Retina Follow Up   Post-op Follow-up      HISTORY OF PRESENT ILLNESS: Austin Bruce is a 75 y.o. male who presents to the clinic today for:   HPI   5 week fu OU oct fp.  After vitrectomy, removal of retained lens fragments and repositioning of intraocular lens right eye October 2022   Pt states Dr. Gershon Crane "he took two stitches out, last week, and since then it has been blurry. It seems worse than before." Denies new FOL or floaters. Pt prescription drops currently using:  Ofloxacin TID OD  Prednisolone TID OD  Ketorolac TID OD   Last edited by Hurman Horn, MD on 08/12/2021  9:21 AM.      Referring physician: Rutherford Guys, [place],  [place] [id number]  HISTORICAL INFORMATION:   Selected notes from the MEDICAL RECORD NUMBER    Lab Results  Component Value Date   HGBA1C 6.1 (H) 03/28/2021     CURRENT MEDICATIONS: Current Outpatient Medications (Ophthalmic Drugs)  Medication Sig   atropine 1 % ophthalmic solution Place 1 drop into the right eye 2 (two) times daily.   No current facility-administered medications for this visit. (Ophthalmic Drugs)   Current Outpatient Medications (Other)  Medication Sig   amLODipine (NORVASC) 10 MG tablet Take 10 mg by mouth daily.   clopidogrel (PLAVIX) 75 MG tablet Take 1 tablet (75 mg total) by mouth daily.   finasteride (PROSCAR) 5 MG tablet Take 5 mg by mouth daily.   indapamide (LOZOL) 2.5 MG tablet Take 2.5 mg by mouth daily.   lisinopril (ZESTRIL) 40 MG tablet Take 40 mg by mouth daily.   meclizine (ANTIVERT) 25 MG tablet Take 25 mg by mouth as needed for dizziness.   Multiple Vitamin (MULTIVITAMIN) tablet Take 1 tablet by mouth daily.   potassium chloride SA (KLOR-CON) 20 MEQ tablet Take 40 mEq by mouth daily.   propranolol (INDERAL) 80 MG tablet Take 80 mg by mouth daily.   simvastatin (ZOCOR) 40 MG  tablet Take 40 mg by mouth daily.   traMADol (ULTRAM) 50 MG tablet Take 50 mg by mouth every 6 (six) hours as needed for moderate pain.   No current facility-administered medications for this visit. (Other)      REVIEW OF SYSTEMS: ROS   Negative for: Constitutional, Gastrointestinal, Neurological, Skin, Genitourinary, Musculoskeletal, HENT, Endocrine, Cardiovascular, Eyes, Respiratory, Psychiatric, Allergic/Imm, Heme/Lymph Last edited by Hurman Horn, MD on 08/12/2021  9:11 AM.       ALLERGIES Allergies  Allergen Reactions   Penicillins Anaphylaxis    PAST MEDICAL HISTORY Past Medical History:  Diagnosis Date   High cholesterol    Hypertension    Stroke [facility])    Past Surgical History:  Procedure Laterality Date   CATARACT EXTRACTION Right 07/01/2021   Dislocated IOL   HAND SURGERY     HERNIA REPAIR     NOSE SURGERY     TONSILLECTOMY      FAMILY HISTORY History reviewed. No pertinent family history.  SOCIAL HISTORY Social History   Tobacco Use   Smoking status: Former    Types: Cigarettes   Smokeless tobacco: Never  Vaping Use   Vaping Use: Never used  Substance Use Topics   Alcohol use: Not Currently   Drug use: Never         OPHTHALMIC EXAM:  Base  Eye Exam     Visual Acuity (ETDRS)       Right Left   Dist [place] 20/400    Dist cc CF at 3' 20/25   Dist ph cc NI          Tonometry (Tonopen, 8:16 AM)       Right Left   Pressure 17 14         Pupils       Shape React   Right Irregular Minimal   Left Round          Visual Fields (Counting fingers)       Left Right    Full Full         Extraocular Movement       Right Left    Full Full         Neuro/Psych     Oriented x3: Yes   Mood/Affect: Normal         Dilation     Both eyes: 1.0% Mydriacyl, 2.5% Phenylephrine @ 8:16 AM           Slit Lamp and Fundus Exam     External Exam       Right Left   External Normal          Slit Lamp Exam        Right Left   Lids/Lashes Normal    Conjunctiva/Sclera 1+ Injection    Cornea Clear, 3 sutures secure wound temporally    Anterior Chamber Deep and quiet    Iris Postsurgical irregular contour to iris inferotemporal, pharmacologically dilated    Lens IOL in the sulcus well centered    Anterior Vitreous Clear vitreous          Fundus Exam       Right Left   Posterior Vitreous Clear, avitric    Disc Normal    C/D Ratio 0.15    Macula Normal    Vessels Normal    Periphery Low lying choroidal detachments inf, nasal and temporally, non-kissing             IMAGING AND PROCEDURES  Imaging and Procedures for 08/12/21  OCT, Retina - OU - Both Eyes       Right Eye Quality was good. Scan locations included subfoveal. Central Foveal Thickness: 340. Findings include normal foveal contour.   Left Eye Quality was good. Scan locations included subfoveal. Central Foveal Thickness: 278. Findings include normal foveal contour.   Notes Clear media OD vitreous is a attached.  OD.  OS normal     Color Fundus Photography Optos - OU - Both Eyes       Right Eye Progression has worsened. Disc findings include normal observations. Macula : normal observations. Vessels : normal observations.   Left Eye Progression has been stable. Disc findings include normal observations. Macula : normal observations. Vessels : normal observations. Periphery : normal observations.   Notes Low-lying choroidals inferior nasal and temporal.  Visual axis still clear, no retina to retina touch clear media     B-Scan Ultrasound - OD - Right Eye       Quality was good. Findings included choroidal detachment, vitreous opacities.   Notes OD, with new onset choroidal detachment, serous with no retinal detachment, minor vitreous debris noted             ASSESSMENT/PLAN:  Dislocated IOL (intraocular lens), posterior, right Condition improved, however late onset choroidal detachments have  developed after recent suture removal performed  elsewhere  Cataract (lens) fragments in eye following cataract surgery, right eye Condition resolved some 1 month previous  Choroidal detachment, serous, right eye Shallow choroidal detachments nasally inferior and temporally OD, will add cycloplegia to his other topical medications and follow-up in 1 week  Snores Patient has documented sleep apnea says in the past.  However difficulty having complete assessment and thus has avoided any therapy.  I have asked asked the patient to promptly seek out medical assistance regarding this issue and to resume all measures to treat what apparently is sleep apnea so as to prevent nightly hypoxic damage to his brain of which the eyes are part of the brain     ICD-10-CM   1. Choroidal detachment, serous, right eye  H31.421 B-Scan Ultrasound - OD - Right Eye    2. Vitreomacular adhesion of right eye  H43.821 OCT, Retina - OU - Both Eyes    Color Fundus Photography Optos - OU - Both Eyes    3. Dislocated IOL (intraocular lens), posterior, right  T85.22XA     4. Cataract (lens) fragments in eye following cataract surgery, right eye  H59.021     5. Snores  R06.83       OD with late onset low-lying choroidal detachments, serous, nonhemorrhagic, not a surgical condition at this time.  We will add cycloplegia to treat possible inadvertent cyclodialysis cleft contributing, will have patient follow-up in 1 week or promptly if severe pain develop  2.  3.  Ophthalmic Meds Ordered this visit:  Meds ordered this encounter  Medications   DISCONTD: atropine 1 % ophthalmic solution    Sig: Place 1 drop into the right eye 2 (two) times daily.    Dispense:  2 mL    Refill:  3   atropine 1 % ophthalmic solution    Sig: Place 1 drop into the right eye 2 (two) times daily.    Dispense:  2 mL    Refill:  3       Return in about 1 week (around 08/19/2021) for COLOR FP, OD, dilate, OCT.  Patient  Instructions  Patient instructed to rinse with topical cycloplegic therapy, atropine 1 drop right eye twice daily, to hasten resolution of these choroidal detachments and to avoid heavy lifting and do not do not mash or rub the eye  Patient instructed to wear the hard patch to cover the right eye to prevent inadvertent rubbing or compression of the globe while sleeping   Explained the diagnoses, plan, and follow up with the patient and they expressed understanding.  Patient expressed understanding of the importance of proper follow up care.   Clent Demark Marium Ragan M.D. Diseases & Surgery of the Retina and Vitreous Retina & Diabetic [place] 08/12/21     Abbreviations: M myopia (nearsighted); A astigmatism; H hyperopia (farsighted); P presbyopia; Mrx spectacle prescription;  CTL contact lenses; OD right eye; OS left eye; OU both eyes  XT exotropia; ET esotropia; PEK punctate epithelial keratitis; PEE punctate epithelial erosions; DES dry eye syndrome; MGD meibomian gland dysfunction; ATs artificial tears; PFAT's preservative free artificial tears; [place] nuclear sclerotic cataract; PSC posterior subcapsular cataract; ERM epi-retinal membrane; PVD posterior vitreous detachment; RD retinal detachment; DM diabetes mellitus; DR diabetic retinopathy; NPDR non-proliferative diabetic retinopathy; PDR proliferative diabetic retinopathy; CSME clinically significant macular edema; DME diabetic macular edema; dbh dot blot hemorrhages; CWS cotton wool spot; POAG primary open angle glaucoma; C/D cup-to-disc ratio; HVF humphrey visual field; GVF goldmann visual field; OCT optical coherence  tomography; IOP intraocular pressure; BRVO Branch retinal vein occlusion; CRVO central retinal vein occlusion; CRAO central retinal artery occlusion; BRAO branch retinal artery occlusion; RT retinal tear; SB scleral buckle; PPV pars plana vitrectomy; VH Vitreous hemorrhage; PRP panretinal laser photocoagulation; IVK intravitreal  kenalog; VMT vitreomacular traction; MH Macular hole;  NVD neovascularization of the disc; NVE neovascularization elsewhere; AREDS age related eye disease study; ARMD age related macular degeneration; POAG primary open angle glaucoma; EBMD epithelial/anterior basement membrane dystrophy; ACIOL anterior chamber intraocular lens; IOL intraocular lens; PCIOL posterior chamber intraocular lens; Phaco/IOL phacoemulsification with intraocular lens placement; [place] photorefractive keratectomy; LASIK laser assisted in situ keratomileusis; HTN hypertension; DM diabetes mellitus; COPD chronic obstructive pulmonary disease

## 2021-08-12 NOTE — Patient Instructions (Signed)
Patient instructed to rinse with topical cycloplegic therapy, atropine 1 drop right eye twice daily, to hasten resolution of these choroidal detachments and to avoid heavy lifting and do not do not mash or rub the eye  Patient instructed to wear the hard patch to cover the right eye to prevent inadvertent rubbing or compression of the globe while sleeping

## 2021-08-12 NOTE — Assessment & Plan Note (Signed)
Patient has documented sleep apnea says in the past.  However difficulty having complete assessment and thus has avoided any therapy.  I have asked asked the patient to promptly seek out medical assistance regarding this issue and to resume all measures to treat what apparently is sleep apnea so as to prevent nightly hypoxic damage to his brain of which the eyes are part of the brain

## 2021-08-12 NOTE — Assessment & Plan Note (Signed)
Condition improved, however late onset choroidal detachments have developed after recent suture removal performed elsewhere

## 2021-08-12 NOTE — Assessment & Plan Note (Signed)
Condition resolved some 1 month previous

## 2021-08-19 ENCOUNTER — Encounter (INDEPENDENT_AMBULATORY_CARE_PROVIDER_SITE_OTHER): Payer: Self-pay | Admitting: Ophthalmology

## 2021-08-19 ENCOUNTER — Ambulatory Visit (INDEPENDENT_AMBULATORY_CARE_PROVIDER_SITE_OTHER): Payer: Medicare Other | Admitting: Ophthalmology

## 2021-08-19 ENCOUNTER — Other Ambulatory Visit: Payer: Self-pay

## 2021-08-19 DIAGNOSIS — H31421 Serous choroidal detachment, right eye: Secondary | ICD-10-CM

## 2021-08-19 DIAGNOSIS — T8522XA Displacement of intraocular lens, initial encounter: Secondary | ICD-10-CM

## 2021-08-19 NOTE — Assessment & Plan Note (Signed)
OD, IOL now well centered.  In the sulcus.  Patient will need refraction, scheduling soon with Dr. Rutherford Guys

## 2021-08-19 NOTE — Assessment & Plan Note (Signed)
OD, with choroidal detachments 1 week post treatment with topical cycloplegic, atropine, completely resolved

## 2021-08-19 NOTE — Patient Instructions (Signed)
Ofloxacin  3times daily to the operative eye  Prednisolone acetate 1 drop to the operative eye 3 times daily  Ketorolac 1 drop to the operative eye 3 times daily    Patient instructed not to refill the medications and should they be completed before 2 weeks it is okay to discontinue  Patient instructed not to refill the medications and use them for maximum of 2 more weeks.   Continue atropine (Red top) 1 drop to the operative eye once daily, until current med bottle is done or 8 weeks, do not refill this on either  Patient instructed do not rub the eye.  Patient has the option to use the patch at night.

## 2021-08-19 NOTE — Progress Notes (Signed)
08/19/2021     CHIEF COMPLAINT Patient presents for  Chief Complaint  Patient presents with   Retina Follow Up      HISTORY OF PRESENT ILLNESS: Austin Bruce is a 75 y.o. male who presents to the clinic today for:   HPI     Retina Follow Up           Diagnosis: Other   Laterality: right eye   Onset: 1 week ago   Severity: mild   Duration: 1 week   Course: stable         Comments   1 week fu for after commencing therapy for late onset serous choroidal detachments, 1 week prior.  Treatment effect to be assessed today after institution of topical cycloplegic, atropine twice daily OD.  Fp and dilate OD and OCT Pt here for fu on choroidal detachment OD Pt states, "My vision seems to be about the same. It may not be as blurry." Pt reports using Atropine gtts BID OD       Last edited by Hurman Horn, MD on 08/19/2021  8:40 AM.      Referring physician: Rutherford Guys, [place],  [place] [id number]  HISTORICAL INFORMATION:   Selected notes from the MEDICAL RECORD NUMBER    Lab Results  Component Value Date   HGBA1C 6.1 (H) 03/28/2021     CURRENT MEDICATIONS: Current Outpatient Medications (Ophthalmic Drugs)  Medication Sig   atropine 1 % ophthalmic solution Place 1 drop into the right eye 2 (two) times daily.   No current facility-administered medications for this visit. (Ophthalmic Drugs)   Current Outpatient Medications (Other)  Medication Sig   amLODipine (NORVASC) 10 MG tablet Take 10 mg by mouth daily.   clopidogrel (PLAVIX) 75 MG tablet Take 1 tablet (75 mg total) by mouth daily.   finasteride (PROSCAR) 5 MG tablet Take 5 mg by mouth daily.   indapamide (LOZOL) 2.5 MG tablet Take 2.5 mg by mouth daily.   lisinopril (ZESTRIL) 40 MG tablet Take 40 mg by mouth daily.   meclizine (ANTIVERT) 25 MG tablet Take 25 mg by mouth as needed for dizziness.   Multiple Vitamin (MULTIVITAMIN) tablet Take 1 tablet by mouth daily.   potassium  chloride SA (KLOR-CON) 20 MEQ tablet Take 40 mEq by mouth daily.   propranolol (INDERAL) 80 MG tablet Take 80 mg by mouth daily.   simvastatin (ZOCOR) 40 MG tablet Take 40 mg by mouth daily.   traMADol (ULTRAM) 50 MG tablet Take 50 mg by mouth every 6 (six) hours as needed for moderate pain.   No current facility-administered medications for this visit. (Other)      REVIEW OF SYSTEMS:    ALLERGIES Allergies  Allergen Reactions   Penicillins Anaphylaxis    PAST MEDICAL HISTORY Past Medical History:  Diagnosis Date   High cholesterol    Hypertension    Stroke [facility])    Past Surgical History:  Procedure Laterality Date   CATARACT EXTRACTION Right 07/01/2021   Dislocated IOL   HAND SURGERY     HERNIA REPAIR     NOSE SURGERY     TONSILLECTOMY      FAMILY HISTORY History reviewed. No pertinent family history.  SOCIAL HISTORY Social History   Tobacco Use   Smoking status: Former    Types: Cigarettes   Smokeless tobacco: Never  Vaping Use   Vaping Use: Never used  Substance Use Topics   Alcohol use: Not  Currently   Drug use: Never         OPHTHALMIC EXAM:  Base Eye Exam     Visual Acuity (ETDRS)       Right Left   Dist cc 20/100    Dist ph cc 20/60 -2     Correction: Glasses         Tonometry (Tonopen, 8:20 AM)       Right Left   Pressure 18 14         Pupils       Shape React APD   Right Irregular Minimal    Left Round Brisk None         Neuro/Psych     Oriented x3: Yes   Mood/Affect: Normal         Dilation     Right eye: 1.0% Mydriacyl, 2.5% Phenylephrine @ 8:20 AM           Slit Lamp and Fundus Exam     External Exam       Right Left   External Normal          Slit Lamp Exam       Right Left   Lids/Lashes Normal    Conjunctiva/Sclera 1+ Injection    Cornea Clear, 3 sutures secure wound temporally    Anterior Chamber Deep and quiet    Iris Postsurgical irregular contour to iris inferotemporal,  pharmacologically dilated    Lens IOL in the sulcus well centered    Anterior Vitreous Clear vitreous          Fundus Exam       Right Left   Posterior Vitreous Clear, avitric    Disc Normal    C/D Ratio 0.15    Macula Normal    Vessels Normal             IMAGING AND PROCEDURES  Imaging and Procedures for 08/19/21  Color Fundus Photography Optos - OU - Both Eyes       Right Eye Progression has worsened. Disc findings include normal observations. Macula : normal observations. Vessels : normal observations.   Left Eye Progression has been stable. Disc findings include normal observations. Macula : normal observations. Vessels : normal observations. Periphery : normal observations.   Notes  Visual axis still clear, resolved choroidal detachments, 1 week post institution of topical cycloplegic     OCT, Retina - OU - Both Eyes       Right Eye Quality was good. Scan locations included subfoveal. Central Foveal Thickness: 337. Findings include normal foveal contour.   Left Eye Quality was good. Scan locations included subfoveal. Central Foveal Thickness: 273. Findings include normal foveal contour.   Notes Clear media OD vitreous is a attached.  OD.  OS normal             ASSESSMENT/PLAN:  Choroidal detachment, serous, right eye OD, with choroidal detachments 1 week post treatment with topical cycloplegic, atropine, completely resolved  Dislocated IOL (intraocular lens), posterior, right OD, IOL now well centered.  In the sulcus.  Patient will need refraction, scheduling soon with Dr. Rutherford Guys     ICD-10-CM   1. Choroidal detachment, serous, right eye  H31.421 Color Fundus Photography Optos - OU - Both Eyes    OCT, Retina - OU - Both Eyes    2. Dislocated IOL (intraocular lens), posterior, right  T85.22XA       1.  OD looks great, choroidal detachment has resolved with topical cycloplegic.  Likely there was an inadvertent or occult  cyclodialysis cleft leading to this condition.  Will continue topical cycloplegia for the current duration of the atropine 1% 1 drop daily to the right eye.   2.  Follow-up with Dr. Rutherford Guys as scheduled  3.  Patient to report new onset visual acuity changes or new onset blurring that worsened today.  Patient instructed importance of do not mash compress or rub the eye  Ophthalmic Meds Ordered this visit:  No orders of the defined types were placed in this encounter.      Return in about 8 weeks (around 10/14/2021).  Patient Instructions  Ofloxacin  3times daily to the operative eye  Prednisolone acetate 1 drop to the operative eye 3 times daily  Ketorolac 1 drop to the operative eye 3 times daily    Patient instructed not to refill the medications and should they be completed before 2 weeks it is okay to discontinue  Patient instructed not to refill the medications and use them for maximum of 2 more weeks.   Continue atropine (Red top) 1 drop to the operative eye once daily, until current med bottle is done or 8 weeks, do not refill this on either  Patient instructed do not rub the eye.  Patient has the option to use the patch at night.   Explained the diagnoses, plan, and follow up with the patient and they expressed understanding.  Patient expressed understanding of the importance of proper follow up care.   Clent Demark [place] M.D. Diseases & Surgery of the Retina and Vitreous Retina & Diabetic [place] 08/19/21     Abbreviations: M myopia (nearsighted); A astigmatism; H hyperopia (farsighted); P presbyopia; Mrx spectacle prescription;  CTL contact lenses; OD right eye; OS left eye; OU both eyes  XT exotropia; ET esotropia; PEK punctate epithelial keratitis; PEE punctate epithelial erosions; DES dry eye syndrome; MGD meibomian gland dysfunction; ATs artificial tears; PFAT's preservative free artificial tears; [place] nuclear sclerotic cataract; PSC posterior subcapsular  cataract; ERM epi-retinal membrane; PVD posterior vitreous detachment; RD retinal detachment; DM diabetes mellitus; DR diabetic retinopathy; NPDR non-proliferative diabetic retinopathy; PDR proliferative diabetic retinopathy; CSME clinically significant macular edema; DME diabetic macular edema; dbh dot blot hemorrhages; CWS cotton wool spot; POAG primary open angle glaucoma; C/D cup-to-disc ratio; HVF humphrey visual field; GVF goldmann visual field; OCT optical coherence tomography; IOP intraocular pressure; BRVO Branch retinal vein occlusion; CRVO central retinal vein occlusion; CRAO central retinal artery occlusion; BRAO branch retinal artery occlusion; RT retinal tear; SB scleral buckle; PPV pars plana vitrectomy; VH Vitreous hemorrhage; PRP panretinal laser photocoagulation; IVK intravitreal kenalog; VMT vitreomacular traction; MH Macular hole;  NVD neovascularization of the disc; NVE neovascularization elsewhere; AREDS age related eye disease study; ARMD age related macular degeneration; POAG primary open angle glaucoma; EBMD epithelial/anterior basement membrane dystrophy; ACIOL anterior chamber intraocular lens; IOL intraocular lens; PCIOL posterior chamber intraocular lens; Phaco/IOL phacoemulsification with intraocular lens placement; [place] photorefractive keratectomy; LASIK laser assisted in situ keratomileusis; HTN hypertension; DM diabetes mellitus; COPD chronic obstructive pulmonary disease

## 2021-09-02 ENCOUNTER — Encounter (INDEPENDENT_AMBULATORY_CARE_PROVIDER_SITE_OTHER): Payer: Medicare Other | Admitting: Ophthalmology

## 2021-09-04 DIAGNOSIS — I10 Essential (primary) hypertension: Secondary | ICD-10-CM | POA: Diagnosis not present

## 2021-09-04 DIAGNOSIS — E782 Mixed hyperlipidemia: Secondary | ICD-10-CM | POA: Diagnosis not present

## 2021-10-14 ENCOUNTER — Other Ambulatory Visit: Payer: Self-pay

## 2021-10-14 ENCOUNTER — Ambulatory Visit (INDEPENDENT_AMBULATORY_CARE_PROVIDER_SITE_OTHER): Payer: Medicare Other | Admitting: Ophthalmology

## 2021-10-14 ENCOUNTER — Encounter (INDEPENDENT_AMBULATORY_CARE_PROVIDER_SITE_OTHER): Payer: Self-pay | Admitting: Ophthalmology

## 2021-10-14 DIAGNOSIS — H2701 Aphakia, right eye: Secondary | ICD-10-CM

## 2021-10-14 DIAGNOSIS — T8522XD Displacement of intraocular lens, subsequent encounter: Secondary | ICD-10-CM | POA: Diagnosis not present

## 2021-10-14 DIAGNOSIS — H31421 Serous choroidal detachment, right eye: Secondary | ICD-10-CM

## 2021-10-14 DIAGNOSIS — Z961 Presence of intraocular lens: Secondary | ICD-10-CM

## 2021-10-14 DIAGNOSIS — H59021 Cataract (lens) fragments in eye following cataract surgery, right eye: Secondary | ICD-10-CM

## 2021-10-14 DIAGNOSIS — T8522XA Displacement of intraocular lens, initial encounter: Secondary | ICD-10-CM

## 2021-10-14 NOTE — Assessment & Plan Note (Signed)
Condition resolved now with pseudophakia

## 2021-10-14 NOTE — Progress Notes (Signed)
10/14/2021     CHIEF COMPLAINT Patient presents for  Chief Complaint  Patient presents with   Retina Evaluation      HISTORY OF PRESENT ILLNESS: Austin Bruce is a 76 y.o. male who presents to the clinic today for:   HPI   Patient seen for  follow-up of choroidal detachment right eye, patient recently discontinued atropine as the medication ran out right eye  Last edited by Hurman Horn, MD on 10/14/2021  8:14 AM.      Referring physician: Rutherford Guys, MD [place],  [place] [id number]  HISTORICAL INFORMATION:   Selected notes from the [facility]    Lab Results  Component Value Date   HGBA1C 6.1 (H) 03/28/2021     CURRENT MEDICATIONS: Current Outpatient Medications (Ophthalmic Drugs)  Medication Sig   atropine 1 % ophthalmic solution Place 1 drop into the right eye 2 (two) times daily.   No current facility-administered medications for this visit. (Ophthalmic Drugs)   Current Outpatient Medications (Other)  Medication Sig   amLODipine (NORVASC) 10 MG tablet Take 10 mg by mouth daily.   clopidogrel (PLAVIX) 75 MG tablet Take 1 tablet (75 mg total) by mouth daily.   finasteride (PROSCAR) 5 MG tablet Take 5 mg by mouth daily.   indapamide (LOZOL) 2.5 MG tablet Take 2.5 mg by mouth daily.   lisinopril (ZESTRIL) 40 MG tablet Take 40 mg by mouth daily.   meclizine (ANTIVERT) 25 MG tablet Take 25 mg by mouth as needed for dizziness.   Multiple Vitamin (MULTIVITAMIN) tablet Take 1 tablet by mouth daily.   potassium chloride SA (KLOR-CON) 20 MEQ tablet Take 40 mEq by mouth daily.   propranolol (INDERAL) 80 MG tablet Take 80 mg by mouth daily.   simvastatin (ZOCOR) 40 MG tablet Take 40 mg by mouth daily.   traMADol (ULTRAM) 50 MG tablet Take 50 mg by mouth every 6 (six) hours as needed for moderate pain.   No current facility-administered medications for this visit. (Other)      REVIEW OF SYSTEMS: ROS   Negative for: Constitutional,  Gastrointestinal, Neurological, Skin, Genitourinary, Musculoskeletal, HENT, Endocrine, Cardiovascular, Eyes, Respiratory, Psychiatric, Allergic/Imm, Heme/Lymph Last edited by Hurman Horn, MD on 10/14/2021  8:14 AM.       ALLERGIES Allergies  Allergen Reactions   Penicillins Anaphylaxis    PAST MEDICAL HISTORY Past Medical History:  Diagnosis Date   High cholesterol    Hypertension    Stroke [facility])    Past Surgical History:  Procedure Laterality Date   CATARACT EXTRACTION Right 07/01/2021   Dislocated IOL   HAND SURGERY     HERNIA REPAIR     NOSE SURGERY     TONSILLECTOMY      FAMILY HISTORY History reviewed. No pertinent family history.  SOCIAL HISTORY Social History   Tobacco Use   Smoking status: Former    Types: Cigarettes   Smokeless tobacco: Never  Vaping Use   Vaping Use: Never used  Substance Use Topics   Alcohol use: Not Currently   Drug use: Never         OPHTHALMIC EXAM:  Base Eye Exam     Visual Acuity (ETDRS)       Right Left   Dist cc 20/60 +1 20/20   Dist ph cc 20/30     Correction: Glasses         Tonometry (Tonopen, 8:12 AM)       Right  Left   Pressure 9 10         Pupils       APD   Right None   Left None  Irregular pupil OD        Visual Fields       Left Right    Full Full         Neuro/Psych     Oriented x3: Yes   Mood/Affect: Normal         Dilation     Both eyes: 1.0% Mydriacyl, 2.5% Phenylephrine @ 8:12 AM           Slit Lamp and Fundus Exam     External Exam       Right Left   External Normal Normal         Slit Lamp Exam       Right Left   Lids/Lashes Normal Normal   Conjunctiva/Sclera 1+ Injection White and quiet   Cornea Clear, 3 sutures secure wound temporally Clear   Anterior Chamber Deep and quiet Deep and quiet   Iris Postsurgical irregular contour to iris inferotemporal, pharmacologically dilated Round and reactive   Lens IOL in the sulcus well centered 2+  Nuclear sclerosis   Anterior Vitreous Clear vitreous Normal         Fundus Exam       Right Left   Posterior Vitreous Clear, avitric Normal   Disc Normal Normal   C/D Ratio 0.15 0.25   Macula Normal Normal   Vessels Normal Normal   Periphery Normal, no choroidal detachments remain Normal            IMAGING AND PROCEDURES  Imaging and Procedures for 10/14/21  Color Fundus Photography Optos - OU - Both Eyes       Right Eye Progression has worsened. Disc findings include normal observations. Macula : normal observations. Vessels : normal observations.   Left Eye Progression has been stable. Disc findings include normal observations. Macula : normal observations. Vessels : normal observations. Periphery : normal observations.   Notes  Visual axis still clear, resolved choroidal detachments, now at 2 months post initial therapy with cycloplegia and some weeks post no therapy     OCT, Retina - OU - Both Eyes       Right Eye Quality was good. Scan locations included subfoveal. Central Foveal Thickness: 325. Findings include normal foveal contour.   Left Eye Quality was good. Scan locations included subfoveal. Central Foveal Thickness: 271. Findings include normal foveal contour.   Notes Clear media OD   OS normal             ASSESSMENT/PLAN:  Cataract (lens) fragments in eye following cataract surgery, right eye Condition resolved  Choroidal detachment, serous, right eye Resolved choroidal detachments, patient instructed do not mash compress or rub the eye  Dislocated IOL (intraocular lens), posterior, right Condition resolved now with pseudophakia  Aphakia, right eye Condition resolved now with pseudophakia IOL central  Pseudophakia of right eye Follow Dr. Rutherford Guys for final refraction     ICD-10-CM   1. Choroidal detachment, serous, right eye  H31.421 Color Fundus Photography Optos - OU - Both Eyes    OCT, Retina - OU - Both Eyes    2.  Cataract (lens) fragments in eye following cataract surgery, right eye  H59.021     3. Dislocated IOL (intraocular lens), posterior, right  T85.22XA     4. Aphakia, right eye  H27.01  5. Pseudophakia of right eye  Z96.1       1.  OD looks great, no residual choroidal detachments, good good pseudophakic condition has been regained.  2.  Follow Dr. Rutherford Guys for final refraction  3.  Ophthalmic Meds Ordered this visit:  No orders of the defined types were placed in this encounter.      Return in about 6 months (around 04/13/2022) for DILATE OU, COLOR FP, OCT.  There are no Patient Instructions on file for this visit.   Explained the diagnoses, plan, and follow up with the patient and they expressed understanding.  Patient expressed understanding of the importance of proper follow up care.   Clent Demark Daniah Zaldivar M.D. Diseases & Surgery of the Retina and Vitreous Retina & Diabetic [place] 10/14/21     Abbreviations: M myopia (nearsighted); A astigmatism; H hyperopia (farsighted); P presbyopia; Mrx spectacle prescription;  CTL contact lenses; OD right eye; OS left eye; OU both eyes  XT exotropia; ET esotropia; PEK punctate epithelial keratitis; PEE punctate epithelial erosions; DES dry eye syndrome; MGD meibomian gland dysfunction; ATs artificial tears; PFAT's preservative free artificial tears; [place] nuclear sclerotic cataract; PSC posterior subcapsular cataract; ERM epi-retinal membrane; PVD posterior vitreous detachment; RD retinal detachment; DM diabetes mellitus; DR diabetic retinopathy; NPDR non-proliferative diabetic retinopathy; PDR proliferative diabetic retinopathy; CSME clinically significant macular edema; DME diabetic macular edema; dbh dot blot hemorrhages; CWS cotton wool spot; POAG primary open angle glaucoma; C/D cup-to-disc ratio; HVF humphrey visual field; GVF goldmann visual field; OCT optical coherence tomography; IOP intraocular pressure; BRVO Branch retinal vein  occlusion; CRVO central retinal vein occlusion; CRAO central retinal artery occlusion; BRAO branch retinal artery occlusion; RT retinal tear; SB scleral buckle; PPV pars plana vitrectomy; VH Vitreous hemorrhage; PRP panretinal laser photocoagulation; IVK intravitreal kenalog; VMT vitreomacular traction; MH Macular hole;  NVD neovascularization of the disc; NVE neovascularization elsewhere; AREDS age related eye disease study; ARMD age related macular degeneration; POAG primary open angle glaucoma; EBMD epithelial/anterior basement membrane dystrophy; ACIOL anterior chamber intraocular lens; IOL intraocular lens; PCIOL posterior chamber intraocular lens; Phaco/IOL phacoemulsification with intraocular lens placement; [place] photorefractive keratectomy; LASIK laser assisted in situ keratomileusis; HTN hypertension; DM diabetes mellitus; COPD chronic obstructive pulmonary disease

## 2021-10-14 NOTE — Assessment & Plan Note (Signed)
Follow Dr. Rutherford Guys for final refraction

## 2021-10-14 NOTE — Assessment & Plan Note (Signed)
Condition resolved 

## 2021-10-14 NOTE — Assessment & Plan Note (Signed)
Condition resolved now with pseudophakia IOL central

## 2021-10-14 NOTE — Assessment & Plan Note (Signed)
Resolved choroidal detachments, patient instructed do not mash compress or rub the eye

## 2021-11-03 DIAGNOSIS — H04123 Dry eye syndrome of bilateral lacrimal glands: Secondary | ICD-10-CM | POA: Diagnosis not present

## 2021-12-03 ENCOUNTER — Other Ambulatory Visit (HOSPITAL_COMMUNITY)
Admission: RE | Admit: 2021-12-03 | Discharge: 2021-12-03 | Disposition: A | Discharge status: Home / Self Care | Payer: Medicare Other | Source: Ambulatory Visit | Attending: Cardiology | Admitting: Cardiology

## 2021-12-03 ENCOUNTER — Ambulatory Visit: Payer: Medicare Other | Admitting: Cardiology

## 2021-12-03 ENCOUNTER — Encounter: Payer: Self-pay | Admitting: Cardiology

## 2021-12-03 ENCOUNTER — Telehealth: Payer: Self-pay | Admitting: Cardiology

## 2021-12-03 VITALS — BP 140/80 | HR 60 | Ht 69.0 in | Wt 221.0 lb

## 2021-12-03 DIAGNOSIS — E7800 Pure hypercholesterolemia, unspecified: Secondary | ICD-10-CM | POA: Insufficient documentation

## 2021-12-03 DIAGNOSIS — I639 Cerebral infarction, unspecified: Secondary | ICD-10-CM

## 2021-12-03 DIAGNOSIS — E785 Hyperlipidemia, unspecified: Secondary | ICD-10-CM

## 2021-12-03 DIAGNOSIS — I10 Essential (primary) hypertension: Secondary | ICD-10-CM | POA: Diagnosis not present

## 2021-12-03 DIAGNOSIS — G4733 Obstructive sleep apnea (adult) (pediatric): Secondary | ICD-10-CM | POA: Diagnosis not present

## 2021-12-03 DIAGNOSIS — G4734 Idiopathic sleep related nonobstructive alveolar hypoventilation: Secondary | ICD-10-CM | POA: Insufficient documentation

## 2021-12-03 LAB — COMPREHENSIVE METABOLIC PANEL
ALT: 36 U/L (ref 0–44)
AST: 24 U/L (ref 15–41)
Albumin: 4.3 g/dL (ref 3.5–5.0)
Alkaline Phosphatase: 71 U/L (ref 38–126)
Anion gap: 10 (ref 5–15)
BUN: 10 mg/dL (ref 8–23)
CO2: 24 mmol/L (ref 22–32)
Calcium: 9.1 mg/dL (ref 8.9–10.3)
Chloride: 98 mmol/L (ref 98–111)
Creatinine, Ser: 0.97 mg/dL (ref 0.61–1.24)
GFR, Estimated: >60 mL/min (ref >60)
Glucose, Bld: 101 mg/dL — ABNORMAL HIGH (ref 70–99)
Potassium: 3.4 mmol/L — ABNORMAL LOW (ref 3.5–5.1)
Sodium: 132 mmol/L — ABNORMAL LOW (ref 135–145)
Total Bilirubin: 1.3 mg/dL — ABNORMAL HIGH (ref 0.3–1.2)
Total Protein: 7.6 g/dL (ref 6.5–8.1)

## 2021-12-03 LAB — LIPID PANEL
Cholesterol: 157 mg/dL (ref 0–200)
HDL: 39 mg/dL — ABNORMAL LOW (ref >40)
LDL Cholesterol: 74 mg/dL (ref 0–99)
Total CHOL/HDL Ratio: 4 RATIO
Triglycerides: 221 mg/dL — ABNORMAL HIGH (ref <150)
VLDL: 44 mg/dL — ABNORMAL HIGH (ref 0–40)

## 2021-12-03 NOTE — Addendum Note (Signed)
Addended by: Barbarann Ehlers A on: 12/03/2021 10:25 AM ? ? Modules accepted: Orders ? ?

## 2021-12-03 NOTE — Telephone Encounter (Signed)
-----   Message from Sueanne Margarita, MD sent at 12/03/2021  2:48 PM EDT ----- ?Potassium mildly reduced, triglycerides elevated on lipid panel total bili minimally elevated.  Please verify the patient is taking potassium chloride 40 mEq daily and then he has not missed any doses.  Triglycerides are elevated.  I would like to get him into our lipid clinic for further evaluation ?

## 2021-12-03 NOTE — Telephone Encounter (Signed)
Patient states he did not take his potassium  this am but he does take 40 meq daily. I placed referral to lipid management  ?

## 2021-12-03 NOTE — Progress Notes (Signed)
? ?Cardiology Office Note   ? ?Date:  12/03/2021  ? ?ID:  Austin Bruce, DOB [birth year], MRN [id number] ? ? ?PCP:  [facility] ?  ?[place]  ?Cardiologist:  None   ?Advanced Practice Provider:  No care team member to display ?Electrophysiologist:  None  ? ?[id number]}  ? ?Chief Complaint  ?Patient presents with  ? Follow-up  ?  Obstructive sleep apnea, hypertension, hyperlipidemia, history of CVA  ? ? ?History of Present Illness:  ?Austin Bruce is a 76 y.o. male with history of hypertension, HLD, TIA in 2017 at [facility] and then status post acute CVA 03/29/2021.  MRI revealed small acute or subacute white matter infarct in the left frontal lobe with severe chronic small vessel ischemic disease and chronic lacunar infarcts in the cerebrum and cerebellum.  Aspirin was stopped due to his history of GI bleeding with aspirin.  2D echo normal LVEF 60 to 65%.  Patient had a 30-day monitor placed for CVA.  Only 9 days were analyzed but he was in normal sinus rhythm without evidence of A. fib.  He also has sleep apnea but not treated because he couldn't use the machine. He says that the CPAP device kept him awake.  ? ?He is here today for followup and is doing well.  He denies any chest pain or pressure, SOB, DOE, PND, orthopnea, LE edema, palpitations or syncope. He has chronic vertigo. He is compliant with his meds and is tolerating meds with no SE.    ? ?Past Medical History:  ?Diagnosis Date  ? High cholesterol   ? Hypertension   ? OSA (obstructive sleep apnea)   ? Intolerant to CPAP  ? Stroke [facility])   ? ? ?Past Surgical History:  ?Procedure Laterality Date  ? CATARACT EXTRACTION Right 07/01/2021  ? Dislocated IOL  ? HAND SURGERY    ? HERNIA REPAIR    ? NOSE SURGERY    ? TONSILLECTOMY    ? ? ?Current Medications: ?Current Meds  ?Medication Sig  ? amLODipine (NORVASC) 10 MG tablet Take 10 mg by mouth daily.  ? atropine 1 % ophthalmic solution Place 1 drop into the right eye 2 (two) times  daily.  ? clopidogrel (PLAVIX) 75 MG tablet Take 1 tablet (75 mg total) by mouth daily.  ? finasteride (PROSCAR) 5 MG tablet Take 5 mg by mouth daily.  ? indapamide (LOZOL) 2.5 MG tablet Take 2.5 mg by mouth daily.  ? lisinopril (ZESTRIL) 40 MG tablet Take 40 mg by mouth daily.  ? meclizine (ANTIVERT) 25 MG tablet Take 25 mg by mouth as needed for dizziness.  ? potassium chloride SA (KLOR-CON) 20 MEQ tablet Take 40 mEq by mouth daily.  ? propranolol (INDERAL) 80 MG tablet Take 80 mg by mouth daily.  ? simvastatin (ZOCOR) 40 MG tablet Take 40 mg by mouth daily.  ?  ? ?Allergies:   Penicillins  ? ?Social History  ? ?Socioeconomic History  ? Marital status: Married  ?  Spouse name: Not on file  ? Number of children: Not on file  ? Years of education: Not on file  ? Highest education level: Not on file  ?Occupational History  ? Not on file  ?Tobacco Use  ? Smoking status: Former  ?  Types: Cigarettes  ? Smokeless tobacco: Never  ?Vaping Use  ? Vaping Use: Never used  ?Substance and Sexual Activity  ? Alcohol use: Not Currently  ? Drug use: Never  ?  Sexual activity: Not on file  ?Other Topics Concern  ? Not on file  ?Social History Narrative  ? Not on file  ? ?Social Determinants of Health  ? ?Financial Resource Strain: Not on file  ?Food Insecurity: Not on file  ?Transportation Needs: Not on file  ?Physical Activity: Not on file  ?Stress: Not on file  ?Social Connections: Not on file  ?  ? ?Family History:  The patient's  family history is not on file.  ? ?ROS:   ?Please see the history of present illness.    ?ROS All other systems reviewed and are negative. ? ? ?PHYSICAL EXAM:   ?VS:  BP 140/80   Pulse 60   Ht '5\' 9"'$  (1.753 m)   Wt 221 lb (100.2 kg)   SpO2 98%   BMI 32.64 kg/m?   ?Physical Exam  ?GEN: Well nourished, well developed in no acute distress ?HEENT: Normal ?NECK: No JVD; No carotid bruits ?LYMPHATICS: No lymphadenopathy ?CARDIAC:RRR, no murmurs, rubs, gallops ?RESPIRATORY:  Clear to auscultation without  rales, wheezing or rhonchi  ?ABDOMEN: Soft, non-tender, non-distended ?MUSCULOSKELETAL:  No edema; No deformity  ?SKIN: Warm and dry ?NEUROLOGIC:  Alert and oriented x 3 ?PSYCHIATRIC:  Normal affect   ?Wt Readings from Last 3 Encounters:  ?12/03/21 221 lb (100.2 kg)  ?06/10/21 226 lb (102.5 kg)  ?06/02/21 225 lb 9.6 oz (102.3 kg)  ?  ? ? ?Studies/Labs Reviewed:  ? ?EKG:  EKG is not ordered today  ? ?Recent Labs: ?03/28/2021: ALT 39 ?03/29/2021: BUN 14; Creatinine, Ser 1.29; Hemoglobin 16.3; Magnesium 1.8; Platelets 239; Potassium 3.2; Sodium 136  ? ?Lipid Panel ?   ?Component Value Date/Time  ? CHOL 125 03/29/2021 0200  ? TRIG 218 (H) 03/29/2021 0200  ? HDL 31 (L) 03/29/2021 0200  ? CHOLHDL 4.0 03/29/2021 0200  ? VLDL 44 (H) 03/29/2021 0200  ? [place] 50 03/29/2021 0200  ? ? ?Additional studies/ records that were reviewed today include:  ?30-day monitor 04/14/2021 ?[employer] monitor reviewed. 9 days analyzed. Predominant rhythm is sinus with IVCD, heart rate ranging from 53 bpm up to 101 bpm and average heart rate 64 bpm. No atrial fibrillation was noted. There were no pauses.  ? ?2D echo 03/29/2021 ?IMPRESSIONS  ? ? 1. Left ventricular ejection fraction, by estimation, is 60 to 65%. The  ?left ventricle has normal function. The left ventricle has no regional  ?wall motion abnormalities. Left ventricular diastolic parameters were  ?normal.  ? 2. Right ventricular systolic function is normal. The right ventricular  ?size is normal. Tricuspid regurgitation signal is inadequate for assessing  ?PA pressure.  ? 3. The mitral valve is grossly normal. Trivial mitral valve  ?regurgitation.  ? 4. The aortic valve is tricuspid. Aortic valve regurgitation is not  ?visualized. Mild aortic valve sclerosis is present, with no evidence of  ?aortic valve stenosis. Aortic valve mean gradient measures 5.0 mmHg.  ? ?FINDINGS  ? Left Ventricle: Left ventricular ejection fraction, by estimation, is 60  ?to 65%. The left ventricle has normal  function. The left ventricle has no  ?regional wall motion abnormalities. The left ventricular internal cavity  ?size was normal in size. There is  ? borderline left ventricular hypertrophy. Left ventricular diastolic  ?parameters were normal.  ? ?Right Ventricle: The right ventricular size is normal. No increase in  ?right ventricular wall thickness. Right ventricular systolic function is  ?normal. Tricuspid regurgitation signal is inadequate for assessing PA  ?pressure.  ? ?Left Atrium: Left atrial  size was normal in size.  ? ?Right Atrium: Right atrial size was normal in size.  ? ?Pericardium: There is no evidence of pericardial effusion.  ? ?Mitral Valve: The mitral valve is grossly normal. Trivial mitral valve  ?regurgitation.  ? ?Tricuspid Valve: The tricuspid valve is grossly normal. Tricuspid valve  ?regurgitation is trivial.  ? ?Aortic Valve: The aortic valve is tricuspid. There is mild aortic valve  ?annular calcification. Aortic valve regurgitation is not visualized. Mild  ?aortic valve sclerosis is present, with no evidence of aortic valve  ?stenosis. Aortic valve mean gradient  ?measures 5.0 mmHg. Aortic valve peak gradient measures 8.6 mmHg. Aortic  ?valve area, by VTI measures 2.43 cm?.  ? ?Pulmonic Valve: The pulmonic valve was grossly normal. Pulmonic valve  ?regurgitation is trivial.  ? ?Aorta: The aortic root is normal in size and structure.  ? ?IAS/Shunts: No atrial level shunt detected by color flow Doppler.  ? ? ?ASSESSMENT:   ? ?1. OSA (obstructive sleep apnea)   ?2. Acute CVA (cerebrovascular accident) ([place])   ?3. Primary hypertension   ?4. High cholesterol   ? ? ? ?PLAN:  ?In order of problems listed above: ? ?Obstructive sleep apnea  ?-He has been intolerant to CPAP in the past  ?-I have explained to him that CPAP therapy has improved significantly with improved masks that allow better tolerance  ?-I also explained to him that untreated obstructive sleep apnea can increase risk of CVA, make  hypertension more difficult to treat and also increased risk of CAD, CHF and heart arrhythmias  ?-I recommended getting a sleep study to determine severity of sleep apnea and get on CPAP if significant.

## 2021-12-03 NOTE — Patient Instructions (Signed)
Medication Instructions:  ?Your physician recommends that you continue on your current medications as directed. Please refer to the Current Medication list given to you today. ? ? ?Labwork: ?Lipids, [place] today ? ?Testing/Procedures: ?Your physician has recommended that you have a sleep study. This test records several body functions during sleep, including: brain activity, eye movement, oxygen and carbon dioxide blood levels, heart rate and rhythm, breathing rate and rhythm, the flow of air through your mouth and nose, snoring, body muscle movements, and chest and belly movement.  ? ?Follow-Up: ?1 year ? ?Any Other Special Instructions Will Be Listed Below (If Applicable). ? ?If you need a refill on your cardiac medications before your next appointment, please call your pharmacy. ? ?

## 2021-12-04 DIAGNOSIS — I10 Essential (primary) hypertension: Secondary | ICD-10-CM | POA: Diagnosis not present

## 2021-12-04 DIAGNOSIS — E782 Mixed hyperlipidemia: Secondary | ICD-10-CM | POA: Diagnosis not present

## 2021-12-10 ENCOUNTER — Telehealth: Payer: Self-pay | Admitting: *Deleted

## 2021-12-10 NOTE — Telephone Encounter (Addendum)
Prior Authorization for NPSG sent to [facility] via web portal.   ?Prior Authorization is not required for the requested services ? NO PA REQUIRED ?01/03/22  -5/30/2 ?DECISION# [id number] ? ?

## 2021-12-16 ENCOUNTER — Ambulatory Visit: Payer: Medicare Other | Admitting: Adult Health

## 2021-12-16 ENCOUNTER — Encounter: Payer: Self-pay | Admitting: Adult Health

## 2021-12-16 VITALS — BP 142/88 | HR 52 | Ht 69.0 in | Wt 223.0 lb

## 2021-12-16 DIAGNOSIS — I69398 Other sequelae of cerebral infarction: Secondary | ICD-10-CM | POA: Diagnosis not present

## 2021-12-16 DIAGNOSIS — R42 Dizziness and giddiness: Secondary | ICD-10-CM

## 2021-12-16 DIAGNOSIS — I639 Cerebral infarction, unspecified: Secondary | ICD-10-CM | POA: Diagnosis not present

## 2021-12-16 DIAGNOSIS — Z8673 Personal history of transient ischemic attack (TIA), and cerebral infarction without residual deficits: Secondary | ICD-10-CM | POA: Diagnosis not present

## 2021-12-16 DIAGNOSIS — R2689 Other abnormalities of gait and mobility: Secondary | ICD-10-CM

## 2021-12-16 NOTE — Patient Instructions (Signed)
Continue clopidogrel 75 mg daily  and simvastatin for secondary stroke prevention ? ?Continue to follow up with PCP regarding cholesterol and blood pressure management  ?Maintain strict control of hypertension with blood pressure goal below 130/90 and cholesterol with LDL cholesterol (bad cholesterol) goal below 70 mg/dL.  ? ?Signs of a Stroke? Follow the BEFAST method:  ?Balance Watch for a sudden loss of balance, trouble with coordination or vertigo ?Eyes Is there a sudden loss of vision in one or both eyes? Or double vision?  ?Face: Ask the person to smile. Does one side of the face droop or is it numb?  ?Arms: Ask the person to raise both arms. Does one arm drift downward? Is there weakness or numbness of a leg? ?Speech: Ask the person to repeat a simple phrase. Does the speech sound slurred/strange? Is the person confused ? ?Time: If you observe any of these signs, call 911. ? ? ? ? ? ? ? ?Thank you for coming to see [place] at [facility]. I hope we have been able to provide you high quality care today. ? ?You may receive a patient satisfaction survey over the next few weeks. We would appreciate your feedback and comments so that we may continue to improve ourselves and the health of our patients. ? ?

## 2021-12-16 NOTE — Progress Notes (Addendum)
?[facility] ?[address] ?[place]. [place] [address] ?[phone] ? ?     STROKE FOLLOW UP NOTE ? ?[name] ?Date of Birth:  [birth year] ?Medical Record Number:  [id number]  ? ?Reason for Referral: stroke follow up ? ? ? ?SUBJECTIVE: ? ? ?CHIEF COMPLAINT:  ?Chief Complaint  ?Patient presents with  ? Follow-up  ?  Rm 3 with spouse Austin Bruce ?Pt is well and stable, no new concerns   ? ? ?HPI:  ? ?Update 12/16/2021 JM: Patient returns for 9-monthstroke follow-up accompanied by his wife, Austin Bruce  Overall stable without new or reoccurring stroke/TIA symptoms.  Chronic gait impairment stable, no use of AD, no recent falls.  Reports continued intermittent vertigo, stable since prior visit, occasional use of meclizine with benefit managed by PCP.  Compliant on Plavix and simvastatin, denies side effects.  Blood pressure today 142/88 - has not yet taken morning medications.  He has follow-up visit scheduled tomorrow with PCP Dr. WJimmye Bruce  No further concerns at this time. ? ? ? ?History provided for reference purposes only ?Initial visit 06/10/2021 JM: Austin Bruce being seen for hospital follow-up accompanied by his wife, Austin Bruce  Overall doing well.  Denies new or reoccurring stroke/TIA symptoms.  Chronic gait impairment with imbalance since prior stroke - denies any worsening. Occasional baseline vertigo with use of meclizine with benefit (managed by PCP).  He has tried to return back to prior activities such as yard work and home improvements (such as painting) but his kids will not allow him to do so.  Also worsening lower back pain limiting - plans to schedule f/u with neuro surg.  ? ?Remains on Plavix and simvastatin without side effects.  Blood pressure today 145/81. Routinely monitors - does fluctuate but has been modifying diet and avoiding high sodium foods. 30-day cardiac event monitor completed but only worn for 9 days but did not show evidence of atrial fibrillation.  Has appt with PCP in  Nov for physical and lab work.  No further concerns at this time. ? ? ?Stroke admission 03/28/2021 ?Mr. DVANSH RECKARTis a 76y.o. male with history of Htn, Hld and a previous cerebellar stroke who presented on 03/28/2021 after an intense bout of vertigo and lightheadedness associated with bilateral numbness, right > left.  Personally reviewed hospitalization pertinent progress notes, lab work and imaging.  Evaluated by Dr. XErlinda Bruce  Episode of vertigo likely possible BPPV and referred to outpatient vestibular PT.  Incidental finding of small acute/subacute white matter infarct in the left frontal lobe, MCA/ACA watershed area likely secondary to small vessel disease but cannot rule out cardioembolic source.  Evidence of chronic lacunar infarcts in the right thalamic and PLIC as well as chronic microhemorrhages likely secondary to chronic hypertension on MRI.  CTA head/neck no significant stenosis.  EF 60 to 65%.  Recommended 30-day cardiac event monitor to rule out A. fib.  LDL 50.  A1c 6.1 (no hx/dx of DM).  Initiated Plavix 75 mg daily - ASA not recommended due to history of LGIB on ASA.  Resumed home Lozol and Inderal for HTN as well as Zocor for HLD.  PT/OT recommended outpatient therapies and discharged home. ? ? ?PERTINENT IMAGING ? ?CT HEAD 03/28/2021 ?IMPRESSION: ?1. No evidence of acute intracranial abnormality. ?2. Severe chronic small vessel ischemic disease. ?3. Small chronic cerebellar infarct. ? ?MR BRAIN 03/28/2021 ?MRA HEAD/NECK ?IMPRESSION: ?1. Small acute or subacute white matter infarct in the left frontal ?lobe. ?2. Severe chronic  small vessel ischemic disease with chronic lacunar ?infarcts in the cerebrum and cerebellum. ?3. No major arterial occlusion or convincing flow limiting proximal ?stenosis in the head and neck within limitations of artifact as ?detailed above. ? ?2D ECHO  ?IMPRESSIONS  ? 1. Left ventricular ejection fraction, by estimation, is 60 to 65%. The  ?left ventricle has normal function.  The left ventricle has no regional  ?wall motion abnormalities. Left ventricular diastolic parameters were  ?normal.  ? 2. Right ventricular systolic function is normal. The right ventricular  ?size is normal. Tricuspid regurgitation signal is inadequate for assessing  ?PA pressure.  ? 3. The mitral valve is grossly normal. Trivial mitral valve  ?regurgitation.  ? 4. The aortic valve is tricuspid. Aortic valve regurgitation is not  ?visualized. Mild aortic valve sclerosis is present, with no evidence of  ?aortic valve stenosis. Aortic valve mean gradient measures 5.0 mmHg. ? ?CARDIAC MONITOR ?Per report by Austin Bruce: 9 days analyzed.  Predominant rhythm is sinus with IVCD, heart rate ranging from 53 bpm up to 101 bpm and average heart rate 64 bpm.  No atrial fibrillation was noted.  There were no pauses. ? ? ? ? ? ? ? ?ROS:   ?14 system review of systems performed and negative with exception of those listed in HPI ? ?PMH:  ?Past Medical History:  ?Diagnosis Date  ? High cholesterol   ? Hypertension   ? OSA (obstructive sleep apnea)   ? Intolerant to CPAP  ? Stroke [facility])   ? ? ?PSH:  ?Past Surgical History:  ?Procedure Laterality Date  ? CATARACT EXTRACTION Right 07/01/2021  ? Dislocated IOL  ? HAND SURGERY    ? HERNIA REPAIR    ? NOSE SURGERY    ? TONSILLECTOMY    ? ? ?Social History:  ?Social History  ? ?Socioeconomic History  ? Marital status: Married  ?  Spouse name: Not on file  ? Number of children: Not on file  ? Years of education: Not on file  ? Highest education level: Not on file  ?Occupational History  ? Not on file  ?Tobacco Use  ? Smoking status: Former  ?  Types: Cigarettes  ? Smokeless tobacco: Never  ?Vaping Use  ? Vaping Use: Never used  ?Substance and Sexual Activity  ? Alcohol use: Not Currently  ? Drug use: Never  ? Sexual activity: Not on file  ?Other Topics Concern  ? Not on file  ?Social History Narrative  ? Not on file  ? ?Social Determinants of Health  ? ?Financial Resource Strain:  Not on file  ?Food Insecurity: Not on file  ?Transportation Needs: Not on file  ?Physical Activity: Not on file  ?Stress: Not on file  ?Social Connections: Not on file  ?Intimate Partner Violence: Not on file  ? ? ?Family History: History reviewed. No pertinent family history. ? ?Medications:   ?Current Outpatient Medications on File Prior to Visit  ?Medication Sig Dispense Refill  ? amLODipine (NORVASC) 10 MG tablet Take 10 mg by mouth daily.    ? aspirin EC 81 MG tablet Take 81 mg by mouth daily. Swallow whole.    ? finasteride (PROSCAR) 5 MG tablet Take 5 mg by mouth daily.    ? indapamide (LOZOL) 2.5 MG tablet Take 2.5 mg by mouth daily.    ? lisinopril (ZESTRIL) 40 MG tablet Take 40 mg by mouth daily.    ? loratadine (CLARITIN) 10 MG tablet Take 10 mg by mouth daily.    ?  simvastatin (ZOCOR) 40 MG tablet Take 40 mg by mouth daily.    ? ?No current facility-administered medications on file prior to visit.  ? ? ?Allergies:   ?Allergies  ?Allergen Reactions  ? Penicillins Anaphylaxis  ? ? ? ? ?OBJECTIVE: ? ?Physical Exam ? ?Vitals:  ? 12/16/21 0744  ?BP: (!) 142/88  ?Pulse: (!) 52  ?Weight: 223 lb (101.2 kg)  ?Height: '5\' 9"'$  (1.753 m)  ? ? ?Body mass index is 32.93 kg/m?Marland Kitchen ?No results found. ? ?General: well developed, well nourished, very pleasant elderly Caucasian male, seated, in no evident distress ?Head: head normocephalic and atraumatic.   ?Neck: supple with no carotid or supraclavicular bruits ?Cardiovascular: regular rate and rhythm, no murmurs ?Musculoskeletal: no deformity ?Skin:  no rash/petichiae ?Vascular:  Normal pulses all extremities ?  ?Neurologic Exam ?Mental Status: Awake and fully alert.  Fluent speech and language.  Oriented to place and time. Recent and remote memory intact. Attention span, concentration and fund of knowledge appropriate. Mood and affect appropriate.  ?Cranial Nerves: OS Pupils briskly reactive to light. OD Irregular pupil (chronic). extraocular movements full without  nystagmus. Visual fields full to confrontation. Hearing intact. Facial sensation intact. Face, tongue, palate moves normally and symmetrically.  ?Motor: Normal bulk and tone. Normal strength in all tested ex

## 2021-12-25 ENCOUNTER — Encounter: Payer: Self-pay | Admitting: Orthopedic Surgery

## 2022-01-01 ENCOUNTER — Ambulatory Visit: Payer: Medicare Other

## 2022-01-01 ENCOUNTER — Ambulatory Visit (INDEPENDENT_AMBULATORY_CARE_PROVIDER_SITE_OTHER): Payer: Medicare Other

## 2022-01-01 ENCOUNTER — Encounter: Payer: Self-pay | Admitting: Orthopedic Surgery

## 2022-01-01 ENCOUNTER — Ambulatory Visit (INDEPENDENT_AMBULATORY_CARE_PROVIDER_SITE_OTHER): Payer: Medicare Other | Admitting: Orthopedic Surgery

## 2022-01-01 VITALS — BP 152/88 | HR 72 | Ht 69.0 in | Wt 223.0 lb

## 2022-01-01 DIAGNOSIS — M7061 Trochanteric bursitis, right hip: Secondary | ICD-10-CM | POA: Diagnosis not present

## 2022-01-01 DIAGNOSIS — M1711 Unilateral primary osteoarthritis, right knee: Secondary | ICD-10-CM

## 2022-01-01 DIAGNOSIS — M25561 Pain in right knee: Secondary | ICD-10-CM | POA: Diagnosis not present

## 2022-01-01 DIAGNOSIS — M25551 Pain in right hip: Secondary | ICD-10-CM

## 2022-01-01 DIAGNOSIS — G8929 Other chronic pain: Secondary | ICD-10-CM

## 2022-01-01 NOTE — Progress Notes (Signed)
New Patient Visit ? ?Assessment: ?Austin Bruce is a 76 y.o. male with the following: ?1. Greater trochanteric bursitis of right hip ?2. Arthritis of right knee ? ?Plan: ?ALVIS EDGELL has pain in the right lateral hip, as well as throughout the right knee.  He has tenderness to palpation in the area of the greater trochanter, which is consistent with greater trochanteric bursitis.  He also has diffuse pain in his knee, with degenerative changes noted on x-ray.  I think the benefit from steroid injection in both the hip, as well as the knee.  He has left proceed.  This was completed without issues.  Tylenol and Voltaren gel for pain.  Follow-up as needed. ? ?Procedure note injection Right knee joint ?  ?Verbal consent was obtained to inject the right knee joint  ?Timeout was completed to confirm the site of injection.  The skin was prepped with alcohol and ethyl chloride was sprayed at the injection site.  ?A 21-gauge needle was used to inject 40 mg of Depo-Medrol and 1% lidocaine (3 cc) into the right knee using an anterolateral approach.  ?There were no complications. A sterile bandage was applied.  ? ?Procedure note injection - Right lateral hip ?  ?Verbal consent was obtained to inject the Right lateral hip.  Patient localized the pain. ?Timeout was completed to confirm the site of injection.  The skin was prepped with alcohol and ethyl chloride was sprayed at the injection site.  ?A 21-gauge needle was used to inject 40 mg of Depo-Medrol and 1% lidocaine (4 cc) into the Right lateral hip, directly over the localized tenderness using a direct lateral approach.  ?There were no complications. A sterile bandage was applied. ? ?Follow-up: ?Return if symptoms worsen or fail to improve. ? ?Subjective: ? ?Chief Complaint  ?Patient presents with  ? Hip Pain  ?  Rt side hip and knee pain for years, worse going up and down stairs.   ? Knee Pain  ? ? ?History of Present Illness: ?Austin Bruce is a 76 y.o. male who has been  referred by Stana Bunting, MD for evaluation of right hip and right knee pain.  He said progressive worsening pain in the lateral aspect of the right hip, as well as his right knee for several years.  No specific injury.  He states it is getting more more difficult to ascend stairs.  He is taking Tylenol for pain.  No prior injections.  Has not worked with physical therapy. ? ? ?Review of Systems: ?No fevers or chills ?No numbness or tingling ?No chest pain ?No shortness of breath ?No bowel or bladder dysfunction ?No GI distress ?No headaches ? ? ?Medical History: ? ?Past Medical History:  ?Diagnosis Date  ? High cholesterol   ? Hypertension   ? OSA (obstructive sleep apnea)   ? Intolerant to CPAP  ? Stroke [facility])   ? ? ?Past Surgical History:  ?Procedure Laterality Date  ? CATARACT EXTRACTION Right 07/01/2021  ? Dislocated IOL  ? HAND SURGERY    ? HERNIA REPAIR    ? NOSE SURGERY    ? TONSILLECTOMY    ? ? ?History reviewed. No pertinent family history. ?Social History  ? ?Tobacco Use  ? Smoking status: Former  ?  Types: Cigarettes  ? Smokeless tobacco: Never  ?Vaping Use  ? Vaping Use: Never used  ?Substance Use Topics  ? Alcohol use: Not Currently  ? Drug use: Never  ? ? ?Allergies  ?Allergen Reactions  ?  Penicillins Anaphylaxis  ? ? ?No outpatient medications have been marked as taking for the 01/01/22 encounter (Office Visit) with Mordecai Rasmussen, MD.  ? ? ?Objective: ?BP (!) 152/88   Pulse 72   Ht '5\' 9"'$  (1.753 m)   Wt 223 lb (101.2 kg)   BMI 32.93 kg/m?  ? ?Physical Exam: ? ?General: Alert and oriented. and No acute distress. ?Gait: Right sided antalgic gait. ? ?Right hip without deformity.  Tenderness palpation directly over the greater trochanter.  No overlying skin changes.  Tolerates gentle range of motion of the right hip.  Evaluation of the right knee is without effusion.  Tenderness palpation on the medial and lateral joint lines.  Functional range of motion of the right knee, with crepitus.  Toes warm  and well-perfused ? ?IMAGING: ?I personally ordered and reviewed the following images ? ?X-rays of the right hip were obtained in clinic today.  No acute injuries noted.  There is some mild to moderate loss of joint space, with some small lateral based osteophytes. ? ?Impression: Mild to moderate right hip arthritis ? ?X-rays of the right knee were obtained in clinic today.  No acute injuries noted.  Neutral overall alignment.  He has general loss of joint space, most severe in the medial compartment.  Small osteophytes are appreciated. ? ?Impression: Mild to moderate right knee arthritis. ? ? ?New Medications:  ?No orders of the defined types were placed in this encounter. ? ? ? ? ?Mordecai Rasmussen, MD ? ?01/01/2022 ?1:52 PM ? ? ?

## 2022-01-01 NOTE — Patient Instructions (Signed)

## 2022-01-12 DIAGNOSIS — H2512 Age-related nuclear cataract, left eye: Secondary | ICD-10-CM | POA: Diagnosis not present

## 2022-01-12 DIAGNOSIS — Z961 Presence of intraocular lens: Secondary | ICD-10-CM | POA: Diagnosis not present

## 2022-01-18 NOTE — Progress Notes (Signed)
Patient ID: Austin Bruce                 DOB: 1946-02-10                    MRN: [id number] ? ? ? ? ?HPI: ?Austin Bruce is a 76 y.o. male patient referred to lipid clinic by Dr. Radford Pax. PMH is significant for HTN, HLD, CVA  (2022), TIA (2017), GI bleed on aspirin, OSA without CPAP, hypokalemia on potassium 40 meq daily, prediabetes (A1c 6.1% on 03/28/21), and vertigo. Pt follows with [facility] for recent stroke. On recent lipid panel TG was elevated to 221. Pt is currently taking simvastatin 40 mg daily.  ? ?Pt presents today for lipid management with his wife and daughter. He is doing well, and reports tolerability with all his current medications including simvastatin 40 mg daily. Pt daughter shares that he takes ginger and ginko for his vertigo, and his symptoms are much improved. Confirmed that he stopped taking aspirin after LGIB. However, pt is on clopidogrel 75 mg daily for stroke prevention, which is not on his current med list. This has been updated. Pt is open to starting a new medication for his TG and switching his statin if necessary.  ? ?Current Medications: simvastatin 40 mg daily ?Intolerances: none ?Risk Factors: ASCVD, HTN, OSA ?LDL goal: <70 mg/dL ? ?Diet:  ?Breakfast: Typically eats out. Eats sausage, eggs, grits/hashbrowns, rice, toast ?Lunch: often skips ?Dinner: hamburgers, spaghetti ?Drinks: mostly water, slightly sweetened tea ? ?Exercise: Was not exercising due to leg and hip pain, which has mostly resolved. Open to resuming exercise, and plans to start with walking. Per pt, needs to be cautious because of his vertigo.  ? ?Family History: None pertinent ? ?Social History: Former smoker. Denies alcohol or drug use.  ? ?Labs: ?12/03/21: TC 157, TG 221, HDL 39, Chol/HDL 4.0, VLDL 44, LDL-C 74 (simvastatin '40mg'$  daily) ?12/03/21: K 3.4, Scr 0.97, AST 24, ALT 36, AlkPhos 71, Tbili 1.3  ? ?Past Medical History:  ?Diagnosis Date  ? High cholesterol   ? Hypertension   ? OSA  (obstructive sleep apnea)   ? Intolerant to CPAP  ? Stroke [facility])   ? ? ?Current Outpatient Medications on File Prior to Visit  ?Medication Sig Dispense Refill  ? amLODipine (NORVASC) 10 MG tablet Take 10 mg by mouth daily.    ? aspirin EC 81 MG tablet Take 81 mg by mouth daily. Swallow whole.    ? finasteride (PROSCAR) 5 MG tablet Take 5 mg by mouth daily.    ? indapamide (LOZOL) 2.5 MG tablet Take 2.5 mg by mouth daily.    ? lisinopril (ZESTRIL) 40 MG tablet Take 40 mg by mouth daily.    ? loratadine (CLARITIN) 10 MG tablet Take 10 mg by mouth daily.    ? simvastatin (ZOCOR) 40 MG tablet Take 40 mg by mouth daily.    ? ?No current facility-administered medications on file prior to visit.  ? ? ?Allergies  ?Allergen Reactions  ? Penicillins Anaphylaxis  ? ? ?Assessment/Plan: ? ?1. Hyperlipidemia - LDL-C is uncontrolled at 74 mg/dL on simvastatin 40 mg daily for goal <70 mg/dL given established ASCVD. Pt should be on high intensity statin given recent stroke. Additionally, amlodipine may increase serum concentration of simvastatin, which should be limited to 20 mg daily when given in combination with amlodipine. Will stop simvastatin and start rosuvastatin 20 mg daily. Pt open to starting Vascepa (icosapent ethyl) 2g  twice daily with meals to lower TG and CV risk. Discussed expected $47/month copay. Some concern for concomitant use of ginger and ginko for vertigo, given potential increased bleeding risk, especially with hx of LGIB. However, because pt reported that supplements were preventing vertigo symptoms, did not recommend discontinuation of supplements today. Discussed role of exercise and diet in reducing risk of future CV events. Encouraged pt to carefully resume exercise as he is able. Sent 1 month supply of both new medications to preferred local pharmacy. Pt will transfer Rx to mail-order pharmacy after establishing tolerability. Will recheck lipid panel and LFTs in ~2 months and follow-up with pt as  needed.  ? ?Patient seen with Austin Bruce, PY4 PharmD Candidate ? ?Austin Bruce, PharmD, BCACP, CPP ?[facility] [place], [place] [address] ?Phone: [phone]; Fax: [phone] ?01/19/2022 10:47 AM ? ?

## 2022-01-19 ENCOUNTER — Ambulatory Visit: Payer: Medicare Other | Admitting: Pharmacist

## 2022-01-19 DIAGNOSIS — E7800 Pure hypercholesterolemia, unspecified: Secondary | ICD-10-CM | POA: Diagnosis not present

## 2022-01-19 MED ORDER — ICOSAPENT ETHYL 1 G PO CAPS: 2.0000 g | ORAL_CAPSULE | Freq: Two times a day (BID) | ORAL | 5 refills | Status: DC

## 2022-01-19 MED ORDER — ROSUVASTATIN CALCIUM 20 MG PO TABS: 20.0000 mg | ORAL_TABLET | Freq: Every day | ORAL | 5 refills | Status: AC

## 2022-01-19 NOTE — Patient Instructions (Addendum)
It was great to see you today! ? ?Start taking Vascepa 2 g (2 capsules) twice daily with food ?Stop simvastatin 40 mg daily, and Start taking rosuvastatin 20 mg once daily ?We will recheck your fasting labs in about two months. You can come by our lab anytime after 7:30AM on July 10th.  ? ? ?

## 2022-01-25 ENCOUNTER — Ambulatory Visit: Payer: Medicare Other | Attending: Cardiology | Admitting: Cardiology

## 2022-01-25 DIAGNOSIS — G4733 Obstructive sleep apnea (adult) (pediatric): Secondary | ICD-10-CM | POA: Insufficient documentation

## 2022-01-25 DIAGNOSIS — G4734 Idiopathic sleep related nonobstructive alveolar hypoventilation: Secondary | ICD-10-CM

## 2022-01-25 DIAGNOSIS — R200 Anesthesia of skin: Secondary | ICD-10-CM | POA: Diagnosis not present

## 2022-01-29 NOTE — Procedures (Signed)
   Patient Name: Austin Bruce, Austin Bruce Date: 01/25/2022 Gender: Male D.O.B: Dec 28, 1945 Age (years): 53 Referring Provider: Fransico Him MD, ABSM Height (inches): 69 Interpreting Physician: Fransico Him MD, ABSM Weight (lbs): 223 RPSGT: Rosebud Poles BMI: 33 MRN: [id number] Neck Size: 17.00  CLINICAL INFORMATION Sleep Study Type: NPSG  Indication for sleep study: N/A  Epworth Sleepiness Score: 9  SLEEP STUDY TECHNIQUE As per the AASM Manual for the Scoring of Sleep and Associated Events v2.3 (April 2016) with a hypopnea requiring 4% desaturations.  The channels recorded and monitored were frontal, central and occipital EEG, electrooculogram (EOG), submentalis EMG (chin), nasal and oral airflow, thoracic and abdominal wall motion, anterior tibialis EMG, snore microphone, electrocardiogram, and pulse oximetry.  MEDICATIONS Medications self-administered by patient taken the night of the study : N/A  SLEEP ARCHITECTURE The study was initiated at 9:57:13 PM and ended at 4:32:04 AM.  Sleep onset time was 25.2 minutes and the sleep efficiency was 59.0%. The total sleep time was 233.1 minutes.  Stage REM latency was 167.5 minutes.  The patient spent 23.38% of the night in stage N1 sleep, 38.18% in stage N2 sleep, 26.86% in stage N3 and 11.6% in REM.  Alpha intrusion was absent.  Supine sleep was 4.93%.  RESPIRATORY PARAMETERS The overall apnea/hypopnea index (AHI) was 2.1 per hour. There were 0 total apneas, including 0 obstructive, 0 central and 0 mixed apneas. There were 8 hypopneas and 8 RERAs.  The AHI during Stage REM sleep was 2.2 per hour.  AHI while supine was 0.0 per hour.  The mean oxygen saturation was 88.80%. The minimum SpO2 during sleep was 86.00%.  moderate snoring was noted during this study.  CARDIAC DATA The 2 lead EKG demonstrated sinus rhythm. The mean heart rate was 54.62 beats per minute. Other EKG findings include: None.  LEG MOVEMENT DATA The  total PLMS were 223 with a resulting PLMS index of 57.40. Associated arousal with leg movement index was 7.7 .  IMPRESSIONS - No significant obstructive sleep apnea occurred during this study (AHI = 2.1/h). - Mild oxygen desaturation was noted during this study (Min O2 = 86.00%). - The patient snored with moderate snoring volume. - No cardiac abnormalities were noted during this study. - Severe periodic limb movements of sleep occurred during the study. Associated arousals were significant.  DIAGNOSIS - Nocturnal Hypoxemia (G47.36)  RECOMMENDATIONS - Mirapex, Requip, or Sinemet for treatment of Periodic Leg Movements of Sleep. - Avoid alcohol, sedatives and other CNS depressants that may worsen sleep apnea and disrupt normal sleep architecture. - Sleep hygiene should be reviewed to assess factors that may improve sleep quality. - Weight management and regular exercise should be initiated or continued if appropriate. - Recommend O2 at 2L [place] during sleep with overnight pulse oximetry on O2.  [Electronically signed] 01/29/2022 08:42 AM  Fransico Him MD, ABSM Diplomate, American Board of Sleep Medicine

## 2022-02-05 ENCOUNTER — Telehealth: Payer: Self-pay | Admitting: *Deleted

## 2022-02-05 DIAGNOSIS — G4733 Obstructive sleep apnea (adult) (pediatric): Secondary | ICD-10-CM

## 2022-02-05 NOTE — Telephone Encounter (Signed)
Patient is returning call.  °

## 2022-02-05 NOTE — Telephone Encounter (Signed)
Reached out to patient and The patient has been notified of the result and verbalized understanding.  All questions (if any) were answered. Marolyn Hammock, [place] 02/05/2022 6:13 PM    Order placed to [facility]. Upon patient request DME selection is [facility] Patient understands he will be contacted by [facility] to set up his ONO. Patient understands to call if [facility] does not contact him with new setup in a timely manner.   [facility] notified of new order   Patient was grateful for the call and thanked me. Marland Kitchen

## 2022-02-05 NOTE — Telephone Encounter (Signed)
-----   Message from Sueanne Margarita, MD sent at 01/29/2022  8:44 AM EDT ----- Please let patient know that sleep study showed no significant sleep apnea but did show nocturnal hypoxemia - please order O2 at 2L [place] at night only and repeat overnight Pulse ox on O2

## 2022-02-05 NOTE — Addendum Note (Signed)
Addended by: Freada Bergeron on: 02/05/2022 06:17 PM   Modules accepted: Orders

## 2022-02-05 NOTE — Telephone Encounter (Signed)
The patient has been notified of the result. Left detailed message on voicemail and informed patient to call back with questions.  Marolyn Hammock, [place] 02/05/2022 4:49 PM

## 2022-02-10 DIAGNOSIS — U071 COVID-19: Secondary | ICD-10-CM | POA: Diagnosis not present

## 2022-02-10 DIAGNOSIS — G4733 Obstructive sleep apnea (adult) (pediatric): Secondary | ICD-10-CM | POA: Diagnosis not present

## 2022-02-10 DIAGNOSIS — H6121 Impacted cerumen, right ear: Secondary | ICD-10-CM | POA: Diagnosis not present

## 2022-02-10 DIAGNOSIS — M545 Low back pain, unspecified: Secondary | ICD-10-CM | POA: Diagnosis not present

## 2022-02-10 DIAGNOSIS — I10 Essential (primary) hypertension: Secondary | ICD-10-CM | POA: Diagnosis not present

## 2022-02-10 DIAGNOSIS — Z8673 Personal history of transient ischemic attack (TIA), and cerebral infarction without residual deficits: Secondary | ICD-10-CM | POA: Diagnosis not present

## 2022-02-10 DIAGNOSIS — Z0000 Encounter for general adult medical examination without abnormal findings: Secondary | ICD-10-CM | POA: Diagnosis not present

## 2022-02-10 DIAGNOSIS — E7849 Other hyperlipidemia: Secondary | ICD-10-CM | POA: Diagnosis not present

## 2022-02-15 DIAGNOSIS — R0683 Snoring: Secondary | ICD-10-CM | POA: Diagnosis not present

## 2022-02-15 DIAGNOSIS — G4730 Sleep apnea, unspecified: Secondary | ICD-10-CM | POA: Diagnosis not present

## 2022-03-08 DIAGNOSIS — H6121 Impacted cerumen, right ear: Secondary | ICD-10-CM | POA: Diagnosis not present

## 2022-03-08 DIAGNOSIS — Z8673 Personal history of transient ischemic attack (TIA), and cerebral infarction without residual deficits: Secondary | ICD-10-CM | POA: Diagnosis not present

## 2022-03-08 DIAGNOSIS — M545 Low back pain, unspecified: Secondary | ICD-10-CM | POA: Diagnosis not present

## 2022-03-08 DIAGNOSIS — G4733 Obstructive sleep apnea (adult) (pediatric): Secondary | ICD-10-CM | POA: Diagnosis not present

## 2022-03-08 DIAGNOSIS — G4734 Idiopathic sleep related nonobstructive alveolar hypoventilation: Secondary | ICD-10-CM | POA: Diagnosis not present

## 2022-03-08 DIAGNOSIS — E7849 Other hyperlipidemia: Secondary | ICD-10-CM | POA: Diagnosis not present

## 2022-03-08 DIAGNOSIS — I10 Essential (primary) hypertension: Secondary | ICD-10-CM | POA: Diagnosis not present

## 2022-03-12 DIAGNOSIS — G4733 Obstructive sleep apnea (adult) (pediatric): Secondary | ICD-10-CM | POA: Diagnosis not present

## 2022-03-15 ENCOUNTER — Other Ambulatory Visit: Payer: Medicare Other

## 2022-03-15 DIAGNOSIS — E7800 Pure hypercholesterolemia, unspecified: Secondary | ICD-10-CM | POA: Diagnosis not present

## 2022-03-15 LAB — HEPATIC FUNCTION PANEL
ALT: 37 IU/L (ref 0–44)
AST: 22 IU/L (ref 0–40)
Albumin: 4.5 g/dL (ref 3.8–4.8)
Alkaline Phosphatase: 82 IU/L (ref 44–121)
Bilirubin Total: 0.9 mg/dL (ref 0.0–1.2)
Bilirubin, Direct: 0.26 mg/dL (ref 0.00–0.40)
Total Protein: 6.9 g/dL (ref 6.0–8.5)

## 2022-03-15 LAB — LIPID PANEL
Chol/HDL Ratio: 3.2 ratio (ref 0.0–5.0)
Cholesterol, Total: 113 mg/dL (ref 100–199)
HDL: 35 mg/dL — ABNORMAL LOW (ref >39)
LDL Chol Calc (NIH): 51 mg/dL (ref 0–99)
Triglycerides: 157 mg/dL — ABNORMAL HIGH (ref 0–149)
VLDL Cholesterol Cal: 27 mg/dL (ref 5–40)

## 2022-03-22 NOTE — Telephone Encounter (Signed)
Called to discuss his ONO results lmtcb.

## 2022-03-23 ENCOUNTER — Telehealth: Payer: Self-pay | Admitting: Cardiology

## 2022-03-23 NOTE — Telephone Encounter (Signed)
Patient is returning CMA's call from yesterday regarding his results.

## 2022-04-12 DIAGNOSIS — G4733 Obstructive sleep apnea (adult) (pediatric): Secondary | ICD-10-CM | POA: Diagnosis not present

## 2022-04-13 ENCOUNTER — Ambulatory Visit (INDEPENDENT_AMBULATORY_CARE_PROVIDER_SITE_OTHER): Payer: Medicare Other | Admitting: Ophthalmology

## 2022-04-13 ENCOUNTER — Encounter (INDEPENDENT_AMBULATORY_CARE_PROVIDER_SITE_OTHER): Payer: Self-pay | Admitting: Ophthalmology

## 2022-04-13 DIAGNOSIS — T8522XA Displacement of intraocular lens, initial encounter: Secondary | ICD-10-CM | POA: Diagnosis not present

## 2022-04-13 DIAGNOSIS — H2701 Aphakia, right eye: Secondary | ICD-10-CM | POA: Diagnosis not present

## 2022-04-13 DIAGNOSIS — H31421 Serous choroidal detachment, right eye: Secondary | ICD-10-CM | POA: Diagnosis not present

## 2022-04-13 DIAGNOSIS — H59021 Cataract (lens) fragments in eye following cataract surgery, right eye: Secondary | ICD-10-CM | POA: Diagnosis not present

## 2022-04-13 DIAGNOSIS — H2512 Age-related nuclear cataract, left eye: Secondary | ICD-10-CM

## 2022-04-13 NOTE — Assessment & Plan Note (Signed)
OS, cataract progression, follow-up Dr. Rutherford Guys scheduled

## 2022-04-13 NOTE — Assessment & Plan Note (Signed)
Now pseudophakic

## 2022-04-13 NOTE — Assessment & Plan Note (Signed)
Sulcus fixation IOL looks great good acuity

## 2022-04-13 NOTE — Assessment & Plan Note (Signed)
Condition resolved OD

## 2022-04-13 NOTE — Assessment & Plan Note (Signed)
Condition improved OD.  Excellent vision.  Using only teardrops now

## 2022-04-13 NOTE — Progress Notes (Signed)
04/13/2022     CHIEF COMPLAINT Patient presents for  Chief Complaint  Patient presents with   Retina Follow Up      HISTORY OF PRESENT ILLNESS: Austin Bruce is a 76 y.o. male who presents to the clinic today for:   HPI     Retina Follow Up           Diagnosis: Other   Laterality: right eye   Severity: moderate   Course: stable         Comments   6 MOS for DILATE , COLOR FP, OCT. Pt stated "I have to put more drops in my eye, I'm not sure if Dr. Gershon Crane is going to do something to my eye but its getting more blurry in my right eye. I am on SYSTANE because my eyes get really blurry and dry." Pt stated vision is a little blurry. Pt reports more floaters in OD. Pt is using SYSTANE as needed.        Last edited by Silvestre Moment on 04/13/2022  8:23 AM.      Referring physician: Rutherford Guys, [place],  [place] [id number]  HISTORICAL INFORMATION:   Selected notes from the MEDICAL RECORD NUMBER    Lab Results  Component Value Date   HGBA1C 6.1 (H) 03/28/2021     CURRENT MEDICATIONS: No current outpatient medications on file. (Ophthalmic Drugs)   No current facility-administered medications for this visit. (Ophthalmic Drugs)   Current Outpatient Medications (Other)  Medication Sig   amLODipine (NORVASC) 10 MG tablet Take 10 mg by mouth daily.   clopidogrel (PLAVIX) 75 MG tablet Take 75 mg by mouth daily.   finasteride (PROSCAR) 5 MG tablet Take 5 mg by mouth daily.   icosapent Ethyl (VASCEPA) 1 g capsule Take 2 capsules (2 g total) by mouth 2 (two) times daily.   indapamide (LOZOL) 2.5 MG tablet Take 2.5 mg by mouth daily.   lisinopril (ZESTRIL) 40 MG tablet Take 40 mg by mouth daily.   loratadine (CLARITIN) 10 MG tablet Take 10 mg by mouth daily.   rosuvastatin (CRESTOR) 20 MG tablet Take 1 tablet (20 mg total) by mouth daily.   No current facility-administered medications for this visit. (Other)      REVIEW OF SYSTEMS: ROS   Negative  for: Constitutional, Gastrointestinal, Neurological, Skin, Genitourinary, Musculoskeletal, HENT, Endocrine, Cardiovascular, Eyes, Respiratory, Psychiatric, Allergic/Imm, Heme/Lymph Last edited by Silvestre Moment on 04/13/2022  8:23 AM.       ALLERGIES Allergies  Allergen Reactions   Penicillins Anaphylaxis    PAST MEDICAL HISTORY Past Medical History:  Diagnosis Date   Aphakia, right eye 07/02/2021   Onset post cataract surgery with dislocation of IOL 07/01/2021  Vitrectomy, insertion three-piece IOL sulcus 07/03/2021   Choroidal detachment, serous, right eye 08/12/2021   Late onset post vitrectomy for dislocated IOL, retained lens fragments and IOL repositioning  08/19/2021, resolved choroidal detachments post topical therapy with atropine   High cholesterol    Hypertension    OSA (obstructive sleep apnea)    Intolerant to CPAP   Stroke [facility])    Past Surgical History:  Procedure Laterality Date   CATARACT EXTRACTION Right 07/01/2021   Dislocated IOL   HAND SURGERY     HERNIA REPAIR     NOSE SURGERY     TONSILLECTOMY      FAMILY HISTORY No family history on file.  SOCIAL HISTORY Social History   Tobacco Use   Smoking status:  Former    Types: Cigarettes   Smokeless tobacco: Never  Scientific laboratory technician Use: Never used  Substance Use Topics   Alcohol use: Not Currently   Drug use: Never         OPHTHALMIC EXAM:  Base Eye Exam     Visual Acuity (ETDRS)       Right Left   Dist cc 20/20 -2 20/20    Correction: Glasses         Tonometry (Tonopen, 8:28 AM)       Right Left   Pressure 22 24         Pupils       Pupils Shape APD   Right PERRL Irregular None   Left PERRL Round None         Visual Fields       Left Right    Full Full         Extraocular Movement       Right Left    Full, Ortho Full, Ortho         Neuro/Psych     Oriented x3: Yes   Mood/Affect: Normal         Dilation     Both eyes: 1.0% Mydriacyl, 2.5%  Phenylephrine @ 8:28 AM           Slit Lamp and Fundus Exam     External Exam       Right Left   External Normal Normal         Slit Lamp Exam       Right Left   Lids/Lashes Normal Normal   Conjunctiva/Sclera 1+ Injection White and quiet   Cornea Clear, 3 sutures secure wound temporally Clear   Anterior Chamber Deep and quiet Deep and quiet   Iris Postsurgical irregular contour to iris inferotemporal, pharmacologically dilated, irregular but stable Round and reactive   Lens IOL in the sulcus well centered 2+ Nuclear sclerosis   Anterior Vitreous Clear vitreous Normal         Fundus Exam       Right Left   Posterior Vitreous Clear, avitric Normal   Disc Normal Normal   C/D Ratio 0.15 0.25   Macula Normal Normal   Vessels Normal Normal   Periphery Normal, no choroidal detachments remain Normal            IMAGING AND PROCEDURES  Imaging and Procedures for 04/13/22  OCT, Retina - OU - Both Eyes       Right Eye Quality was good. Scan locations included subfoveal. Central Foveal Thickness: 309. Progression has been stable. Findings include normal foveal contour.   Left Eye Quality was good. Scan locations included subfoveal. Central Foveal Thickness: 275. Progression has been stable. Findings include normal foveal contour, vitreomacular adhesion .   Notes Clear media OD   OS normal      Color Fundus Photography Optos - OU - Both Eyes       Right Eye Progression has worsened. Disc findings include normal observations. Macula : normal observations. Vessels : normal observations.   Left Eye Progression has been stable. Disc findings include normal observations. Macula : normal observations. Vessels : normal observations. Periphery : normal observations.   Notes  Visual axis still clear, resolved choroidal detachments, now at 2 months post initial therapy with cycloplegia and some weeks post no therapy               ASSESSMENT/PLAN:  Aphakia, right eye  Now pseudophakic  Cataract (lens) fragments in eye following cataract surgery, right eye Condition improved OD.  Excellent vision.  Using only teardrops now  Choroidal detachment, serous, right eye Condition resolved OD  Dislocated IOL (intraocular lens), posterior, right Sulcus fixation IOL looks great good acuity  Age-related nuclear cataract, left OS, cataract progression, follow-up Dr. Rutherford Guys scheduled     ICD-10-CM   1. Choroidal detachment, serous, right eye  H31.421 OCT, Retina - OU - Both Eyes    Color Fundus Photography Optos - OU - Both Eyes    2. Aphakia, right eye  H27.01     3. Cataract (lens) fragments in eye following cataract surgery, right eye  H59.021     4. Dislocated IOL (intraocular lens), posterior, right  T85.22XA     5. Age-related nuclear cataract, left  H25.12       1.  OD doing very well post vitrectomy, insertion of secondary IOL in the sulcus.  Excellent acuity.  Some irregular pupil changes remain which are permanent however good acuity ,no retinal pathology  2.  OS cataract progression follow-up Dr. Rutherford Guys as scheduled likely needs surgery  3.  Ophthalmic Meds Ordered this visit:  No orders of the defined types were placed in this encounter.      Return in about 7 months (around 11/12/2022) for DILATE OU, COLOR FP, OCT.  There are no Patient Instructions on file for this visit.   Explained the diagnoses, plan, and follow up with the patient and they expressed understanding.  Patient expressed understanding of the importance of proper follow up care.   Clent Demark Mattheus Rauls M.D. Diseases & Surgery of the Retina and Vitreous Retina & Diabetic [place] 04/13/22     Abbreviations: M myopia (nearsighted); A astigmatism; H hyperopia (farsighted); P presbyopia; Mrx spectacle prescription;  CTL contact lenses; OD right eye; OS left eye; OU both eyes  XT exotropia; ET esotropia; PEK punctate  epithelial keratitis; PEE punctate epithelial erosions; DES dry eye syndrome; MGD meibomian gland dysfunction; ATs artificial tears; PFAT's preservative free artificial tears; [place] nuclear sclerotic cataract; PSC posterior subcapsular cataract; ERM epi-retinal membrane; PVD posterior vitreous detachment; RD retinal detachment; DM diabetes mellitus; DR diabetic retinopathy; NPDR non-proliferative diabetic retinopathy; PDR proliferative diabetic retinopathy; CSME clinically significant macular edema; DME diabetic macular edema; dbh dot blot hemorrhages; CWS cotton wool spot; POAG primary open angle glaucoma; C/D cup-to-disc ratio; HVF humphrey visual field; GVF goldmann visual field; OCT optical coherence tomography; IOP intraocular pressure; BRVO Branch retinal vein occlusion; CRVO central retinal vein occlusion; CRAO central retinal artery occlusion; BRAO branch retinal artery occlusion; RT retinal tear; SB scleral buckle; PPV pars plana vitrectomy; VH Vitreous hemorrhage; PRP panretinal laser photocoagulation; IVK intravitreal kenalog; VMT vitreomacular traction; MH Macular hole;  NVD neovascularization of the disc; NVE neovascularization elsewhere; AREDS age related eye disease study; ARMD age related macular degeneration; POAG primary open angle glaucoma; EBMD epithelial/anterior basement membrane dystrophy; ACIOL anterior chamber intraocular lens; IOL intraocular lens; PCIOL posterior chamber intraocular lens; Phaco/IOL phacoemulsification with intraocular lens placement; [place] photorefractive keratectomy; LASIK laser assisted in situ keratomileusis; HTN hypertension; DM diabetes mellitus; COPD chronic obstructive pulmonary disease

## 2022-04-17 NOTE — Telephone Encounter (Signed)
Spoke to patient and he was on 2 liters of oxygen when he took his ONO test. Dr turner has been made aware.

## 2022-05-13 DIAGNOSIS — G4733 Obstructive sleep apnea (adult) (pediatric): Secondary | ICD-10-CM | POA: Diagnosis not present

## 2022-05-18 ENCOUNTER — Telehealth: Payer: Self-pay | Admitting: Cardiology

## 2022-05-18 DIAGNOSIS — G4733 Obstructive sleep apnea (adult) (pediatric): Secondary | ICD-10-CM

## 2022-05-18 NOTE — Telephone Encounter (Signed)
Pt is requesting call back to discuss getting a prescription for a generator for his oxygen so his insurance will pay for said generator. Requesting call back to discuss this.

## 2022-05-20 NOTE — Telephone Encounter (Signed)
Patient called to check on status of his phone the other day.

## 2022-05-24 NOTE — Telephone Encounter (Signed)
Pt calling for an update on o2 generator, pt states it will be coming from [facility]

## 2022-05-25 DIAGNOSIS — H6121 Impacted cerumen, right ear: Secondary | ICD-10-CM | POA: Diagnosis not present

## 2022-05-25 DIAGNOSIS — H903 Sensorineural hearing loss, bilateral: Secondary | ICD-10-CM | POA: Diagnosis not present

## 2022-06-02 NOTE — Telephone Encounter (Signed)
[place] pt

## 2022-06-02 NOTE — Telephone Encounter (Signed)
Pt calling for update on o2 generator

## 2022-06-11 NOTE — Telephone Encounter (Signed)
Dr Radford Pax says, Okay to write prescription for O2 generator. Rx placed in Epic. Called [employer] and they [place]'t know anything about what the patient is talking about. Called [facility] where patient got his oxygen to see if they were familiar with the patients request and they didn't know either. Reached back out to the patient and told him no one knew what he was talking about including [employer]. Patient sates his insurance told him in the event of a power outage they did not want him to be without his oxygen for an extended time so the patient thought that meant he needed a Insurance risk surveyor. I informed him [facility] could get him 1-2 of the big oxygen tanks that does not need power and last 24 hours each in the event bad wether was forecasted with possible power outages and the doctor can write a letter to say he should be put on the list to be one of the first homes to get power back first but that is not what he wanted. He was not happy and stated everyone has the wrong understanding.

## 2022-06-12 DIAGNOSIS — G4733 Obstructive sleep apnea (adult) (pediatric): Secondary | ICD-10-CM | POA: Diagnosis not present

## 2022-06-15 DIAGNOSIS — H04123 Dry eye syndrome of bilateral lacrimal glands: Secondary | ICD-10-CM | POA: Diagnosis not present

## 2022-07-01 ENCOUNTER — Encounter (INDEPENDENT_AMBULATORY_CARE_PROVIDER_SITE_OTHER): Payer: Self-pay

## 2022-07-13 DIAGNOSIS — G4733 Obstructive sleep apnea (adult) (pediatric): Secondary | ICD-10-CM | POA: Diagnosis not present

## 2022-07-15 DIAGNOSIS — Z23 Encounter for immunization: Secondary | ICD-10-CM | POA: Diagnosis not present

## 2022-08-02 DIAGNOSIS — Z23 Encounter for immunization: Secondary | ICD-10-CM | POA: Diagnosis not present

## 2022-08-11 DIAGNOSIS — E876 Hypokalemia: Secondary | ICD-10-CM | POA: Diagnosis not present

## 2022-08-11 DIAGNOSIS — I10 Essential (primary) hypertension: Secondary | ICD-10-CM | POA: Diagnosis not present

## 2022-08-11 DIAGNOSIS — E559 Vitamin D deficiency, unspecified: Secondary | ICD-10-CM | POA: Diagnosis not present

## 2022-08-11 DIAGNOSIS — M545 Low back pain, unspecified: Secondary | ICD-10-CM | POA: Diagnosis not present

## 2022-08-11 DIAGNOSIS — E7849 Other hyperlipidemia: Secondary | ICD-10-CM | POA: Diagnosis not present

## 2022-08-11 DIAGNOSIS — Z0001 Encounter for general adult medical examination with abnormal findings: Secondary | ICD-10-CM | POA: Diagnosis not present

## 2022-08-11 DIAGNOSIS — Z131 Encounter for screening for diabetes mellitus: Secondary | ICD-10-CM | POA: Diagnosis not present

## 2022-08-11 DIAGNOSIS — Z1329 Encounter for screening for other suspected endocrine disorder: Secondary | ICD-10-CM | POA: Diagnosis not present

## 2022-08-12 DIAGNOSIS — Z0001 Encounter for general adult medical examination with abnormal findings: Secondary | ICD-10-CM | POA: Diagnosis not present

## 2022-08-12 DIAGNOSIS — M545 Low back pain, unspecified: Secondary | ICD-10-CM | POA: Diagnosis not present

## 2022-08-12 DIAGNOSIS — I10 Essential (primary) hypertension: Secondary | ICD-10-CM | POA: Diagnosis not present

## 2022-08-12 DIAGNOSIS — E782 Mixed hyperlipidemia: Secondary | ICD-10-CM | POA: Diagnosis not present

## 2022-08-12 DIAGNOSIS — G4733 Obstructive sleep apnea (adult) (pediatric): Secondary | ICD-10-CM | POA: Diagnosis not present

## 2022-08-12 DIAGNOSIS — E876 Hypokalemia: Secondary | ICD-10-CM | POA: Diagnosis not present

## 2022-08-18 DIAGNOSIS — G4733 Obstructive sleep apnea (adult) (pediatric): Secondary | ICD-10-CM | POA: Diagnosis not present

## 2022-08-18 DIAGNOSIS — Z0001 Encounter for general adult medical examination with abnormal findings: Secondary | ICD-10-CM | POA: Diagnosis not present

## 2022-08-18 DIAGNOSIS — E7849 Other hyperlipidemia: Secondary | ICD-10-CM | POA: Diagnosis not present

## 2022-08-18 DIAGNOSIS — Z8673 Personal history of transient ischemic attack (TIA), and cerebral infarction without residual deficits: Secondary | ICD-10-CM | POA: Diagnosis not present

## 2022-08-18 DIAGNOSIS — G4734 Idiopathic sleep related nonobstructive alveolar hypoventilation: Secondary | ICD-10-CM | POA: Diagnosis not present

## 2022-08-18 DIAGNOSIS — M545 Low back pain, unspecified: Secondary | ICD-10-CM | POA: Diagnosis not present

## 2022-08-18 DIAGNOSIS — Z23 Encounter for immunization: Secondary | ICD-10-CM | POA: Diagnosis not present

## 2022-08-18 DIAGNOSIS — I10 Essential (primary) hypertension: Secondary | ICD-10-CM | POA: Diagnosis not present

## 2022-08-18 DIAGNOSIS — U071 COVID-19: Secondary | ICD-10-CM | POA: Diagnosis not present

## 2022-09-01 DIAGNOSIS — I10 Essential (primary) hypertension: Secondary | ICD-10-CM | POA: Diagnosis not present

## 2022-09-01 DIAGNOSIS — M545 Low back pain, unspecified: Secondary | ICD-10-CM | POA: Diagnosis not present

## 2022-09-01 DIAGNOSIS — G4733 Obstructive sleep apnea (adult) (pediatric): Secondary | ICD-10-CM | POA: Diagnosis not present

## 2022-09-01 DIAGNOSIS — U071 COVID-19: Secondary | ICD-10-CM | POA: Diagnosis not present

## 2022-09-01 DIAGNOSIS — E7849 Other hyperlipidemia: Secondary | ICD-10-CM | POA: Diagnosis not present

## 2022-09-01 DIAGNOSIS — Z8673 Personal history of transient ischemic attack (TIA), and cerebral infarction without residual deficits: Secondary | ICD-10-CM | POA: Diagnosis not present

## 2022-09-01 DIAGNOSIS — G4734 Idiopathic sleep related nonobstructive alveolar hypoventilation: Secondary | ICD-10-CM | POA: Diagnosis not present

## 2022-09-09 DIAGNOSIS — D045 Carcinoma in situ of skin of trunk: Secondary | ICD-10-CM | POA: Diagnosis not present

## 2022-09-09 DIAGNOSIS — L820 Inflamed seborrheic keratosis: Secondary | ICD-10-CM | POA: Diagnosis not present

## 2022-09-09 DIAGNOSIS — L821 Other seborrheic keratosis: Secondary | ICD-10-CM | POA: Diagnosis not present

## 2022-09-09 DIAGNOSIS — D225 Melanocytic nevi of trunk: Secondary | ICD-10-CM | POA: Diagnosis not present

## 2022-09-12 DIAGNOSIS — G4733 Obstructive sleep apnea (adult) (pediatric): Secondary | ICD-10-CM | POA: Diagnosis not present

## 2022-09-15 DIAGNOSIS — I10 Essential (primary) hypertension: Secondary | ICD-10-CM | POA: Diagnosis not present

## 2022-09-15 DIAGNOSIS — Z8673 Personal history of transient ischemic attack (TIA), and cerebral infarction without residual deficits: Secondary | ICD-10-CM | POA: Diagnosis not present

## 2022-09-15 DIAGNOSIS — E7849 Other hyperlipidemia: Secondary | ICD-10-CM | POA: Diagnosis not present

## 2022-09-15 DIAGNOSIS — M545 Low back pain, unspecified: Secondary | ICD-10-CM | POA: Diagnosis not present

## 2022-09-15 DIAGNOSIS — G4733 Obstructive sleep apnea (adult) (pediatric): Secondary | ICD-10-CM | POA: Diagnosis not present

## 2022-09-15 DIAGNOSIS — U071 COVID-19: Secondary | ICD-10-CM | POA: Diagnosis not present

## 2022-09-15 DIAGNOSIS — G4734 Idiopathic sleep related nonobstructive alveolar hypoventilation: Secondary | ICD-10-CM | POA: Diagnosis not present

## 2022-09-29 DIAGNOSIS — Z1211 Encounter for screening for malignant neoplasm of colon: Secondary | ICD-10-CM | POA: Diagnosis not present

## 2022-09-29 DIAGNOSIS — Z1212 Encounter for screening for malignant neoplasm of rectum: Secondary | ICD-10-CM | POA: Diagnosis not present

## 2022-10-05 ENCOUNTER — Ambulatory Visit: Payer: Medicare Other | Admitting: Internal Medicine

## 2022-10-13 DIAGNOSIS — G4733 Obstructive sleep apnea (adult) (pediatric): Secondary | ICD-10-CM | POA: Diagnosis not present

## 2022-10-14 ENCOUNTER — Encounter: Payer: Self-pay | Admitting: Pulmonary Disease

## 2022-10-14 ENCOUNTER — Ambulatory Visit (INDEPENDENT_AMBULATORY_CARE_PROVIDER_SITE_OTHER): Payer: Medicare Other | Admitting: Pulmonary Disease

## 2022-10-14 VITALS — BP 142/88 | HR 81 | Ht 69.0 in | Wt 223.8 lb

## 2022-10-14 DIAGNOSIS — R0683 Snoring: Secondary | ICD-10-CM

## 2022-10-14 DIAGNOSIS — G4734 Idiopathic sleep related nonobstructive alveolar hypoventilation: Secondary | ICD-10-CM | POA: Diagnosis not present

## 2022-10-14 NOTE — Assessment & Plan Note (Signed)
I reviewed his sleep study.  He does not have significant OSA.  He did have nocturnal hypoxia with desaturation less than 88% for about 2 hours out of 4 hours total sleep time..  Unclear why this would be so.  He does not have any overt signs of pulmonary disease.  He does not have congestive heart failure or significant pulmonary hypertension on echo. Will repeat nocturnal oximetry on room air.  He has been using oxygen since set up in June 2023 with questionable benefit.  If he still has persistent desaturations then we will proceed with pulmonary evaluation including chest x-ray and PFTs in I reviewed chest x-ray from 2017 which appears clear.

## 2022-10-14 NOTE — Progress Notes (Signed)
Subjective:    Patient ID: Austin Bruce, male    DOB: December 07, 1945, 77 y.o.   MRN: [id number]  HPI  Chief Complaint  Patient presents with   Consult    Pt sleep consult, he is adamant he doesn't need a CPAP machine. States that he has no issues falling asleep or staying asleep.    77 year old remote smoker presents to establish care for nocturnal hypoxia. He is accompanied by his wife Precious Bard who corroborates history  PMH - hypertension, HLD, TIA in 2017 at [facility] and CVA 03/29/2021.  MRI revealed small acute or subacute white matter infarct in the left frontal lobe with severe chronic small vessel ischemic disease and chronic lacunar infarcts in the cerebrum and cerebellum.  He smoked about 20 pack years before he quit at age 22  He reports a diagnosis of OSA several years ago when he lived in [place] but his PCP did not put him on CPAP machine and because he did not want to interrupt his sleep. Wife reports loud snoring and witnessed apneas.  He denies excessive daytime somnolence but she does report that he takes naps on the couch for about an hour. He underwent sleep study in May 2023 by cardiology, I reviewed consultation, this did not show significant OSA but showed nocturnal desaturation to 86% for about 2 hours of the night.  This also showed severe PLM's for a few associated arousals. Epworth sleepiness score is 1 and I feel he is underreporting. Bedtime is between 9 and 10 PM, sleep latency minimal, he sleeps on his side with 1 pillow, reports 1-2 nocturnal awakenings for nocturia and is out of bed by 5 AM feeling refreshed without dryness of mouth or headache. 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  Significant tests/ events reviewed   01/2022 NPSG TST 233 mins, AHI 2/h, low sat 86%, severe PLMs but arousal index 7/h, saturation less than 88% for 123 minutes   Past  Medical History:  Diagnosis Date   Aphakia, right eye 07/02/2021   Onset post cataract surgery with dislocation of IOL 07/01/2021  Vitrectomy, insertion three-piece IOL sulcus 07/03/2021   Choroidal detachment, serous, right eye 08/12/2021   Late onset post vitrectomy for dislocated IOL, retained lens fragments and IOL repositioning  08/19/2021, resolved choroidal detachments post topical therapy with atropine   High cholesterol    Hypertension    OSA (obstructive sleep apnea)    Intolerant to CPAP   Stroke [facility])    Past Surgical History:  Procedure Laterality Date   CATARACT EXTRACTION Right 07/01/2021   Dislocated IOL   HAND SURGERY     HERNIA REPAIR     NOSE SURGERY     TONSILLECTOMY     Allergies  Allergen Reactions   Penicillins Anaphylaxis   Social History   Socioeconomic History   Marital status: Married    Spouse name: Not on file   Number of children: Not on file   Years of education: Not on file   Highest education level: Not on file  Occupational History   Not on file  Tobacco Use   Smoking status: Former    Packs/day: 3.00    Types: Cigarettes    Start date: 09/06/1956    Quit date: 09/07/1987    Years since quitting: 35.1   Smokeless tobacco: Never   Tobacco comments:    Pt reports  that at the most he smoked 3ppd  Vaping Use   Vaping Use: Never used  Substance and Sexual Activity   Alcohol use: Not Currently   Drug use: Never   Sexual activity: Not on file  Other Topics Concern   Not on file  Social History Narrative   Not on file   Social Determinants of Health   Financial Resource Strain: Not on file  Food Insecurity: Not on file  Transportation Needs: Not on file  Physical Activity: Not on file  Stress: Not on file  Social Connections: Not on file  Intimate Partner Violence: Not on file    History reviewed. No pertinent family history.   Review of Systems Constitutional: negative for anorexia, fevers and sweats  Eyes: negative for  irritation, redness and visual disturbance  Ears, nose, mouth, throat, and face: negative for earaches, epistaxis, nasal congestion and sore throat  Respiratory: negative for cough, dyspnea on exertion, sputum and wheezing  Cardiovascular: negative for chest pain, dyspnea, lower extremity edema, orthopnea, palpitations and syncope  Gastrointestinal: negative for abdominal pain, constipation, diarrhea, melena, nausea and vomiting  Genitourinary:negative for dysuria, frequency and hematuria  Hematologic/lymphatic: negative for bleeding, easy bruising and lymphadenopathy  Musculoskeletal:negative for arthralgias, muscle weakness and stiff joints  Neurological: negative for coordination problems, gait problems, headaches and weakness  Endocrine: negative for diabetic symptoms including polydipsia, polyuria and weight loss     Objective:   Physical Exam  Gen. Pleasant, obese, in no distress, normal affect ENT - no pallor,icterus, no post nasal drip, class 2-3 airway Neck: No JVD, no thyromegaly, no carotid bruits Lungs: no use of accessory muscles, no dullness to percussion, decreased without rales or rhonchi  Cardiovascular: Rhythm regular, heart sounds  normal, no murmurs or gallops, no peripheral edema Abdomen: soft and non-tender, no hepatosplenomegaly, BS normal. Musculoskeletal: No deformities, no cyanosis or clubbing Neuro:  alert, non focal, no tremors        Assessment & Plan:

## 2022-10-14 NOTE — Patient Instructions (Signed)
  X ONO on RA / [employer]

## 2022-11-04 ENCOUNTER — Encounter: Payer: Self-pay | Admitting: Radiology

## 2022-11-11 DIAGNOSIS — G4733 Obstructive sleep apnea (adult) (pediatric): Secondary | ICD-10-CM | POA: Diagnosis not present

## 2022-11-15 ENCOUNTER — Encounter (INDEPENDENT_AMBULATORY_CARE_PROVIDER_SITE_OTHER): Payer: Medicare Other | Admitting: Ophthalmology

## 2022-11-18 ENCOUNTER — Encounter (HOSPITAL_COMMUNITY): Payer: Self-pay

## 2022-11-18 ENCOUNTER — Emergency Department (HOSPITAL_COMMUNITY): Payer: Medicare Other

## 2022-11-18 ENCOUNTER — Observation Stay (HOSPITAL_COMMUNITY)
Admission: EM | Admit: 2022-11-18 | Discharge: 2022-11-19 | Disposition: A | Discharge status: Home / Self Care | Payer: Medicare Other | Attending: Family Medicine | Admitting: Family Medicine

## 2022-11-18 ENCOUNTER — Observation Stay (HOSPITAL_COMMUNITY): Payer: Medicare Other

## 2022-11-18 ENCOUNTER — Other Ambulatory Visit: Payer: Self-pay

## 2022-11-18 DIAGNOSIS — Z8673 Personal history of transient ischemic attack (TIA), and cerebral infarction without residual deficits: Secondary | ICD-10-CM | POA: Insufficient documentation

## 2022-11-18 DIAGNOSIS — E876 Hypokalemia: Secondary | ICD-10-CM | POA: Insufficient documentation

## 2022-11-18 DIAGNOSIS — R2681 Unsteadiness on feet: Secondary | ICD-10-CM | POA: Insufficient documentation

## 2022-11-18 DIAGNOSIS — R449 Unspecified symptoms and signs involving general sensations and perceptions: Secondary | ICD-10-CM | POA: Diagnosis not present

## 2022-11-18 DIAGNOSIS — E782 Mixed hyperlipidemia: Secondary | ICD-10-CM | POA: Insufficient documentation

## 2022-11-18 DIAGNOSIS — R531 Weakness: Principal | ICD-10-CM | POA: Insufficient documentation

## 2022-11-18 DIAGNOSIS — R2990 Unspecified symptoms and signs involving the nervous system: Secondary | ICD-10-CM | POA: Diagnosis not present

## 2022-11-18 DIAGNOSIS — Z79899 Other long term (current) drug therapy: Secondary | ICD-10-CM | POA: Diagnosis not present

## 2022-11-18 DIAGNOSIS — E871 Hypo-osmolality and hyponatremia: Secondary | ICD-10-CM | POA: Diagnosis present

## 2022-11-18 DIAGNOSIS — R29810 Facial weakness: Secondary | ICD-10-CM

## 2022-11-18 DIAGNOSIS — R269 Unspecified abnormalities of gait and mobility: Secondary | ICD-10-CM | POA: Insufficient documentation

## 2022-11-18 DIAGNOSIS — R29898 Other symptoms and signs involving the musculoskeletal system: Secondary | ICD-10-CM | POA: Diagnosis not present

## 2022-11-18 DIAGNOSIS — Z87891 Personal history of nicotine dependence: Secondary | ICD-10-CM | POA: Diagnosis not present

## 2022-11-18 DIAGNOSIS — M6281 Muscle weakness (generalized): Secondary | ICD-10-CM | POA: Diagnosis not present

## 2022-11-18 DIAGNOSIS — D72829 Elevated white blood cell count, unspecified: Secondary | ICD-10-CM | POA: Insufficient documentation

## 2022-11-18 DIAGNOSIS — I10 Essential (primary) hypertension: Secondary | ICD-10-CM | POA: Diagnosis not present

## 2022-11-18 DIAGNOSIS — Z7902 Long term (current) use of antithrombotics/antiplatelets: Secondary | ICD-10-CM | POA: Diagnosis not present

## 2022-11-18 DIAGNOSIS — E7800 Pure hypercholesterolemia, unspecified: Secondary | ICD-10-CM | POA: Diagnosis present

## 2022-11-18 DIAGNOSIS — R42 Dizziness and giddiness: Secondary | ICD-10-CM | POA: Diagnosis not present

## 2022-11-18 DIAGNOSIS — N179 Acute kidney failure, unspecified: Secondary | ICD-10-CM | POA: Diagnosis not present

## 2022-11-18 LAB — PROTIME-INR
INR: 1 (ref 0.8–1.2)
Prothrombin Time: 13 seconds (ref 11.4–15.2)

## 2022-11-18 LAB — CBC
HCT: 46.6 % (ref 39.0–52.0)
Hemoglobin: 17.1 g/dL — ABNORMAL HIGH (ref 13.0–17.0)
MCH: 32.6 pg (ref 26.0–34.0)
MCHC: 36.7 g/dL — ABNORMAL HIGH (ref 30.0–36.0)
MCV: 88.9 fL (ref 80.0–100.0)
Platelets: 240 10*3/uL (ref 150–400)
RBC: 5.24 MIL/uL (ref 4.22–5.81)
RDW: 13.5 % (ref 11.5–15.5)
WBC: 10.7 10*3/uL — ABNORMAL HIGH (ref 4.0–10.5)
nRBC: 0 % (ref 0.0–0.2)

## 2022-11-18 LAB — COMPREHENSIVE METABOLIC PANEL
ALT: 46 U/L — ABNORMAL HIGH (ref 0–44)
AST: 33 U/L (ref 15–41)
Albumin: 4.1 g/dL (ref 3.5–5.0)
Alkaline Phosphatase: 57 U/L (ref 38–126)
Anion gap: 14 (ref 5–15)
BUN: 21 mg/dL (ref 8–23)
CO2: 24 mmol/L (ref 22–32)
Calcium: 9.3 mg/dL (ref 8.9–10.3)
Chloride: 94 mmol/L — ABNORMAL LOW (ref 98–111)
Creatinine, Ser: 1.35 mg/dL — ABNORMAL HIGH (ref 0.61–1.24)
GFR, Estimated: 55 mL/min — ABNORMAL LOW (ref >60)
Glucose, Bld: 137 mg/dL — ABNORMAL HIGH (ref 70–99)
Potassium: 2.9 mmol/L — ABNORMAL LOW (ref 3.5–5.1)
Sodium: 132 mmol/L — ABNORMAL LOW (ref 135–145)
Total Bilirubin: 1.3 mg/dL — ABNORMAL HIGH (ref 0.3–1.2)
Total Protein: 7.3 g/dL (ref 6.5–8.1)

## 2022-11-18 LAB — RAPID URINE DRUG SCREEN, HOSP PERFORMED
Amphetamines: NOT DETECTED
Barbiturates: NOT DETECTED
Benzodiazepines: NOT DETECTED
Cocaine: NOT DETECTED
Opiates: NOT DETECTED
Tetrahydrocannabinol: NOT DETECTED

## 2022-11-18 LAB — DIFFERENTIAL
Abs Immature Granulocytes: 0.04 10*3/uL (ref 0.00–0.07)
Basophils Absolute: 0.1 10*3/uL (ref 0.0–0.1)
Basophils Relative: 1 %
Eosinophils Absolute: 0.3 10*3/uL (ref 0.0–0.5)
Eosinophils Relative: 3 %
Immature Granulocytes: 0 %
Lymphocytes Relative: 18 %
Lymphs Abs: 2 10*3/uL (ref 0.7–4.0)
Monocytes Absolute: 1 10*3/uL (ref 0.1–1.0)
Monocytes Relative: 9 %
Neutro Abs: 7.3 10*3/uL (ref 1.7–7.7)
Neutrophils Relative %: 69 %

## 2022-11-18 LAB — URINALYSIS, ROUTINE W REFLEX MICROSCOPIC
Bilirubin Urine: NEGATIVE
Glucose, UA: NEGATIVE mg/dL
Hgb urine dipstick: NEGATIVE
Ketones, ur: NEGATIVE mg/dL
Leukocytes,Ua: NEGATIVE
Nitrite: NEGATIVE
Protein, ur: NEGATIVE mg/dL
Specific Gravity, Urine: 1.01 (ref 1.005–1.030)
pH: 7 (ref 5.0–8.0)

## 2022-11-18 LAB — ETHANOL: Alcohol, Ethyl (B): <10 mg/dL (ref <10)

## 2022-11-18 LAB — APTT: aPTT: 29 seconds (ref 24–36)

## 2022-11-18 MED ORDER — SENNOSIDES-DOCUSATE SODIUM 8.6-50 MG PO TABS: 1.0000 | ORAL_TABLET | Freq: Every evening | ORAL | Status: DC | PRN

## 2022-11-18 MED ORDER — ASPIRIN 81 MG PO TBEC
81.0000 mg | DELAYED_RELEASE_TABLET | Freq: Every day | ORAL | Status: DC
  Administered 2022-11-19: 81 mg via ORAL
  Filled 2022-11-18: qty 1

## 2022-11-18 MED ORDER — LISINOPRIL 10 MG PO TABS
40.0000 mg | ORAL_TABLET | Freq: Every day | ORAL | Status: DC
  Administered 2022-11-19: 40 mg via ORAL
  Filled 2022-11-18: qty 4

## 2022-11-18 MED ORDER — ROSUVASTATIN CALCIUM 20 MG PO TABS
20.0000 mg | ORAL_TABLET | Freq: Every day | ORAL | Status: DC
  Administered 2022-11-19: 20 mg via ORAL
  Filled 2022-11-18: qty 1

## 2022-11-18 MED ORDER — AMLODIPINE BESYLATE 5 MG PO TABS: 10.0000 mg | ORAL_TABLET | Freq: Every day | ORAL | Status: DC

## 2022-11-18 MED ORDER — ENOXAPARIN SODIUM 40 MG/0.4ML IJ SOSY
40.0000 mg | PREFILLED_SYRINGE | INTRAMUSCULAR | Status: DC
  Administered 2022-11-18: 40 mg via SUBCUTANEOUS
  Filled 2022-11-18: qty 0.4

## 2022-11-18 MED ORDER — POTASSIUM CHLORIDE IN NACL 20-0.9 MEQ/L-% IV SOLN
Freq: Once | INTRAVENOUS | Status: DC
  Filled 2022-11-18: qty 1000

## 2022-11-18 MED ORDER — IOHEXOL 350 MG/ML SOLN
100.0000 mL | Freq: Once | INTRAVENOUS | Status: AC | PRN
  Administered 2022-11-18: 100 mL via INTRAVENOUS

## 2022-11-18 MED ORDER — ACETAMINOPHEN 650 MG RE SUPP: 650.0000 mg | RECTAL | Status: DC | PRN

## 2022-11-18 MED ORDER — ACETAMINOPHEN 160 MG/5ML PO SOLN: 650.0000 mg | ORAL | Status: DC | PRN

## 2022-11-18 MED ORDER — CLOPIDOGREL BISULFATE 75 MG PO TABS
75.0000 mg | ORAL_TABLET | Freq: Every day | ORAL | Status: DC
  Administered 2022-11-19: 75 mg via ORAL
  Filled 2022-11-18: qty 1

## 2022-11-18 MED ORDER — ASPIRIN 81 MG PO CHEW
81.0000 mg | CHEWABLE_TABLET | Freq: Once | ORAL | Status: AC
  Administered 2022-11-18: 81 mg via ORAL
  Filled 2022-11-18: qty 1

## 2022-11-18 MED ORDER — STROKE: EARLY STAGES OF RECOVERY BOOK
Freq: Once | Status: AC
  Filled 2022-11-18: qty 1

## 2022-11-18 MED ORDER — HYDRALAZINE HCL 20 MG/ML IJ SOLN: 10.0000 mg | Freq: Four times a day (QID) | INTRAMUSCULAR | Status: DC | PRN

## 2022-11-18 MED ORDER — POTASSIUM CHLORIDE IN NACL 20-0.9 MEQ/L-% IV SOLN: INTRAVENOUS | Status: DC

## 2022-11-18 MED ORDER — ACETAMINOPHEN 325 MG PO TABS: 650.0000 mg | ORAL_TABLET | ORAL | Status: DC | PRN

## 2022-11-18 NOTE — Consult Note (Signed)
NEUROLOGY TELECONSULTATION NOTE   Date of service: November 18, 2022 Patient Name: Austin Bruce MRN:  [id number] DOB:  12-Nov-1945 Reason for consult: telestroke  Requesting Provider: Dr. Carmin Muskrat Consult Participants: myself, patient, bedside RN, telestroke RN Location of the provider: [place], [place] Location of the patient: [place]  This consult was provided via telemedicine with 2-way video and audio communication. The patient/family was informed that care would be provided in this way and agreed to receive care in this manner.   _ _ _   _ __   _ __ _ _  __ __   _ __   __ _  History of Present Illness   This is a 77 yo man with hx HTN, HL, OSA, 2 prior strokes (reports only residual deficit is loss of taste sensation) who presents for dizziness, palpitations, RLE weakness, and R sensory deficit. LKW 1600 after which he felt that his vision became blurry and he felt dizzy describing it as "feeling like things are moving when they are not." He was brought into ED where he was found to have RLE drift and sensory deficit to R face/arm/leg prompting a stroke code to be activated. Symptoms were mild and improving; on my tele-examination he had only R NLF flattening and sensory deficit to R face/arm/leg. TNK was not administered 2/2 mild and rapidly improving sx. CNS imaging was performed prior to telestroke activation. Personal review of imaging as follows:  CT head no acute process  CTA H&N no LVO  CTP 16 ml perfusion deficit is in multiple vascular territories in R hemisphere - does not localize to sx and is favored to be artifactual  In NSR on arrival. He takes plavix but is not on anticoagulation.   ROS   Per HPI; all other systems reviewed and are negative  Past History   The following was personally reviewed:  Past Medical History:  Diagnosis Date   Aphakia, right eye 07/02/2021   Onset post cataract surgery with dislocation of IOL 07/01/2021  Vitrectomy, insertion  three-piece IOL sulcus 07/03/2021   Choroidal detachment, serous, right eye 08/12/2021   Late onset post vitrectomy for dislocated IOL, retained lens fragments and IOL repositioning  08/19/2021, resolved choroidal detachments post topical therapy with atropine   High cholesterol    Hypertension    OSA (obstructive sleep apnea)    Intolerant to CPAP   Stroke [facility])    Past Surgical History:  Procedure Laterality Date   CATARACT EXTRACTION Right 07/01/2021   Dislocated IOL   HAND SURGERY     HERNIA REPAIR     NOSE SURGERY     TONSILLECTOMY     History reviewed. No pertinent family history. Social History   Socioeconomic History   Marital status: Married    Spouse name: Not on file   Number of children: Not on file   Years of education: Not on file   Highest education level: Not on file  Occupational History   Not on file  Tobacco Use   Smoking status: Former    Packs/day: 3    Types: Cigarettes    Start date: 09/06/1956    Quit date: 09/07/1987    Years since quitting: 35.2   Smokeless tobacco: Never   Tobacco comments:    Pt reports that at the most he smoked 3ppd  Vaping Use   Vaping Use: Never used  Substance and Sexual Activity   Alcohol use: Not Currently   Drug use: Never  Sexual activity: Not on file  Other Topics Concern   Not on file  Social History Narrative   Not on file   Social Determinants of Health   Financial Resource Strain: Not on file  Food Insecurity: Not on file  Transportation Needs: Not on file  Physical Activity: Not on file  Stress: Not on file  Social Connections: Not on file   Allergies  Allergen Reactions   Penicillins Anaphylaxis    Medications   (Not in a hospital admission)    No current facility-administered medications for this encounter.  Current Outpatient Medications:    amLODipine (NORVASC) 10 MG tablet, Take 10 mg by mouth daily., Disp: , Rfl:    clopidogrel (PLAVIX) 75 MG tablet, Take 75 mg by mouth daily., Disp:  , Rfl:    finasteride (PROSCAR) 5 MG tablet, Take 5 mg by mouth daily., Disp: , Rfl:    icosapent Ethyl (VASCEPA) 1 g capsule, Take 2 capsules (2 g total) by mouth 2 (two) times daily., Disp: 120 capsule, Rfl: 5   indapamide (LOZOL) 2.5 MG tablet, Take 2.5 mg by mouth daily., Disp: , Rfl:    lisinopril (ZESTRIL) 40 MG tablet, Take 40 mg by mouth daily., Disp: , Rfl:    loratadine (CLARITIN) 10 MG tablet, Take 10 mg by mouth daily., Disp: , Rfl:    meclizine (ANTIVERT) 12.5 MG tablet, Take 12.5 mg by mouth 3 (three) times daily as needed for nausea., Disp: , Rfl:    rosuvastatin (CRESTOR) 20 MG tablet, Take 1 tablet (20 mg total) by mouth daily., Disp: 30 tablet, Rfl: 5  Vitals   Vitals:   11/18/22 1812 11/18/22 1813  BP: (!) 153/102   Pulse: 91   Resp: 18   Temp: 97.9 F (36.6 C)   TempSrc: Oral   SpO2: 97%   Weight:  101.2 kg  Height:  '5\' 9"'$  (1.753 m)     Body mass index is 32.93 kg/m.  Physical Exam   Exam performed over telemedicine with 2-way video and audio communication and with assistance of bedside RN  Physical Exam Gen: A&O x4, NAD Resp: normal WOB CV: extremities appear well-perfused  Neuro: *MS: A&O x4. Follows multi-step commands.  *Speech: nondysarthric, no aphasia, able to name and repeat *CN: PERRL 43m, EOMI, VFF by confrontation, sensation impaired R face, R NLF flattening, hearing intact to voice *Motor:   Normal bulk.  No tremor, rigidity or bradykinesia. No pronator drift. All extremities appear full-strength and symmetric. *Sensory: Sensory deficit R arm and leg. Symmetric. No double-simultaneous extinction.  *Coordination:  Finger-to-nose, heel-to-shin, rapid alternating motions were intact. *Reflexes:  UTA 2/2 tele-exam *Gait: deferred  NIHSS = 2 for facial droop and sensory deficit   Premorbid mRS = 1   Labs   CBC:  Recent Labs  Lab 11/18/22 1835  WBC 10.7*  NEUTROABS 7.3  HGB 17.1*  HCT 46.6  MCV 88.9  PLT 240    Basic  Metabolic Panel:  Lab Results  Component Value Date   NA 132 (L) 12/03/2021   K 3.4 (L) 12/03/2021   CO2 24 12/03/2021   GLUCOSE 101 (H) 12/03/2021   BUN 10 12/03/2021   CREATININE 0.97 12/03/2021   CALCIUM 9.1 12/03/2021   GFRNONAA >60 12/03/2021   Lipid Panel:  Lab Results  Component Value Date   LDLCALC 51 03/15/2022   HgbA1c:  Lab Results  Component Value Date   HGBA1C 6.1 (H) 03/28/2021   Urine Drug Screen:  Component Value Date/Time   LABOPIA NONE DETECTED 03/28/2021 1210   COCAINSCRNUR NONE DETECTED 03/28/2021 1210   LABBENZ NONE DETECTED 03/28/2021 1210   AMPHETMU NONE DETECTED 03/28/2021 1210   THCU NONE DETECTED 03/28/2021 1210   LABBARB NONE DETECTED 03/28/2021 1210    Alcohol Level     Component Value Date/Time   ETH <10 03/28/2021 1115   Personal review of imaging as follows:  CT head no acute process  CTA H&N no LVO  CTP 16 ml perfusion deficit is in multiple vascular territories in R hemisphere - does not localize to sx and is favored to be artifactual  Impression   This is a 77 yo man with hx HTN, HL, OSA, 2 prior strokes (reports only residual deficit is loss of taste sensation) who presents for dizziness, palpitations, RLE weakness, and R sensory deficit starting at 1600. CT head, CTA H&N, CT perfusion showed no significant abnl on personal review. NIHSS = 2. TNK not administered 2/2 mild and rapidly improving sx; risk was favored to outweigh the benefit. Presentation is concerning for TIA vs small ischemic stroke. He did have palpitations and dizziness at onset raising suspicion for occult a fib although he is in NSR now.   Recommendations   - Recommend close monitoring in ED with vital signs and NIHSS both q 30 min until outside of the TNK window at 8:30pm.  If his neurologic exam is worsens a stroke code should be immediately reactivated.  If his exam is stable or improved at 8:30pm he should at that point be admitted to the hospitalist  service for stroke/TIA work-up. - Permissive HTN x48 hrs from sx onset or until stroke ruled out by MRI goal BP <220/110. PRN labetalol or hydralazine if BP above these parameters. Avoid oral antihypertensives. - MRI brain wo contrast - TTE - Check A1c and LDL + add statin per guidelines - ASA '81mg'$  daily + plavix '75mg'$  daily x21 days f/b plavix '75mg'$  daily monotherapy after that - q4 hr neuro checks - STAT head CT for any change in neuro exam - Tele - PT/OT/SLP - Stroke education - Amb referral to neurology upon discharge - Patient should undergo ambulatory cardiac monitoring for at least 14 days post-discharge to evaluate for possible pAF - Please place a teleneurology consult to Dr. Hortense Ramal in AM for follow-up  ______________________________________________________________________   Thank you for the opportunity to take part in the care of this patient. If you have any further questions, please contact the neurology consultation attending.  Signed,  Su Monks, MD [facility] [phone]  If 7pm- 7am, please page neurology on call as listed in [place].  **Any copied and pasted documentation in this note was written by me in another application not billed for and pasted by me into this document.

## 2022-11-18 NOTE — ED Provider Notes (Signed)
[facility] Provider Note   CSN: [id number] Arrival date & time: 11/18/22  1803  An emergency department physician performed an initial assessment on this suspected stroke patient at 98.  History Chief Complaint  Patient presents with   Dizziness    Austin Bruce is a 77 y.o. male with history of 2 prior strokes on clopidogrel who presents to the emergency department with right-sided weakness and numbness that started around 4 PM today.  Patient was working outside just return from [employer] when he started feeling off.  He states that when he would try to stop movement his body would take a moment to catch up with his brain.  He does feel weaker on the right side particularly in the right leg.  He denies any slurred speech, chest pain, shortness of breath, loss of consciousness, dizziness.  Last stroke was approximately 2 years ago.   Dizziness      Home Medications Prior to Admission medications   Medication Sig Start Date End Date Taking? Authorizing Provider  amLODipine (NORVASC) 10 MG tablet Take 10 mg by mouth daily.    [provider]  clopidogrel (PLAVIX) 75 MG tablet Take 75 mg by mouth daily.    [provider]  finasteride (PROSCAR) 5 MG tablet Take 5 mg by mouth daily.    [provider]  icosapent Ethyl (VASCEPA) 1 g capsule Take 2 capsules (2 g total) by mouth 2 (two) times daily. 01/19/22   Sueanne Margarita, MD  indapamide (LOZOL) 2.5 MG tablet Take 2.5 mg by mouth daily.    [provider]  lisinopril (ZESTRIL) 40 MG tablet Take 40 mg by mouth daily.    [provider]  loratadine (CLARITIN) 10 MG tablet Take 10 mg by mouth daily.    [provider]  meclizine (ANTIVERT) 12.5 MG tablet Take 12.5 mg by mouth 3 (three) times daily as needed for nausea. 07/28/22   [provider]  rosuvastatin (CRESTOR) 20 MG tablet Take 1 tablet (20 mg total) by mouth daily. 01/19/22    Sueanne Margarita, MD      Allergies    Penicillins    Review of Systems   Review of Systems  Neurological:  Positive for dizziness.  All other systems reviewed and are negative.   Physical Exam Updated Vital Signs BP (!) 153/102 (BP Location: Right Arm)   Pulse 91   Temp 97.9 F (36.6 C) (Oral)   Resp 18   Ht '5\' 9"'$  (1.753 m)   Wt 101.2 kg   SpO2 97%   BMI 32.93 kg/m  Physical Exam Vitals and nursing note reviewed.  Constitutional:      General: He is not in acute distress.    Appearance: Normal appearance.  HENT:     Head: Normocephalic and atraumatic.  Eyes:     General:        Right eye: No discharge.        Left eye: No discharge.  Cardiovascular:     Comments: Regular rate and rhythm.  S1/S2 are distinct without any evidence of murmur, rubs, or gallops.  Radial pulses are 2+ bilaterally.  Dorsalis pedis pulses are 2+ bilaterally.  No evidence of pedal edema. Pulmonary:     Comments: Clear to auscultation bilaterally.  Normal effort.  No respiratory distress.  No evidence of wheezes, rales, or rhonchi heard throughout. Abdominal:     General: Abdomen is flat. Bowel sounds are normal.  There is no distension.     Tenderness: There is no abdominal tenderness. There is no guarding or rebound.  Musculoskeletal:        General: Normal range of motion.     Cervical back: Neck supple.  Skin:    General: Skin is warm and dry.     Findings: No rash.  Neurological:     General: No focal deficit present.     Mental Status: He is alert.     Comments: Cranial nerves II through XII are intact apart from the 5th cranial nerve as he is having subjective decrease sensation over the right side of the face.  No dysmetria with finger-nose.  3/5 strength in the right leg in comparison to the left.  Upper extremity strength is normal.  Decreased subjective sensation in the right lower extremity.  Speech is normal.  Psychiatric:        Mood and Affect: Mood normal.        Behavior:  Behavior normal.     ED Results / Procedures / Treatments   Labs (all labs ordered are listed, but only abnormal results are displayed) Labs Reviewed  CBC - Abnormal; Notable for the following components:      Result Value   WBC 10.7 (*)    Hemoglobin 17.1 (*)    MCHC 36.7 (*)    All other components within normal limits  DIFFERENTIAL  ETHANOL  PROTIME-INR  APTT  COMPREHENSIVE METABOLIC PANEL  RAPID URINE DRUG SCREEN, HOSP PERFORMED  URINALYSIS, ROUTINE W REFLEX MICROSCOPIC  I-STAT CHEM 8, ED    EKG None  Radiology No results found.  Procedures .Critical Care  Performed by: Hendricks Limes, PA-C Authorized by: Hendricks Limes, PA-C   Critical care provider statement:    Critical care time (minutes):  35   Critical care time was exclusive of:  Separately billable procedures and treating other patients   Critical care was necessary to treat or prevent imminent or life-threatening deterioration of the following conditions:  CNS failure or compromise   Critical care was time spent personally by me on the following activities:  Blood draw for specimens, discussions with consultants, examination of patient, evaluation of patient's response to treatment, ordering and review of laboratory studies, ordering and review of radiographic studies and ordering and performing treatments and interventions     Medications Ordered in ED Medications  iohexol (OMNIPAQUE) 350 MG/ML injection 100 mL (100 mLs Intravenous Contrast Given 11/18/22 1847)    ED Course/ Medical Decision Making/ A&P   {   Click here for ABCD2, HEART and other calculators  Medical Decision Making Austin Bruce is a 77 y.o. male patient who presents to the emergency department for further evaluation of right lower extremity weakness and subjective numbness.  Patient is within stroke window.  I evaluated this patient in triage.  Code stroke was initiated.  Patient is on clopidogrel.  Patient was immediately  taken over to CT scan and lab work was initiated as well.  Rest was workup is still pending.  The rest of his care will be transferred to Dr. Vanita Panda where ultimate disposition will be made. Patient still needs to be formally evaluated by teleneurology after he return from CT.   Amount and/or Complexity of Data Reviewed Labs: ordered. Radiology: ordered.  Risk Prescription drug management.   Final Clinical Impression(s) / ED Diagnoses Final diagnoses:  None    Rx / DC Orders ED Discharge Orders  None         Myna Bright [place], [place] 11/18/22 Vernon Prey, MD 11/19/22 2125

## 2022-11-18 NOTE — Progress Notes (Signed)
1834 call time 1836 beeper time 1839 exam started 1841 exam finished 1842 images sent to soc 1844 exam completed in epic 1844 [facility] called

## 2022-11-18 NOTE — ED Notes (Addendum)
Pt taken to CT from vertical treatment area by 2 RNs. PA reported to Charge RN that pt's right leg is weak and he has sensation loss on the entire right side.

## 2022-11-18 NOTE — Progress Notes (Signed)
1833 Stroke cart activated and elert sent to [facility]. Pt assessed by EDP Windle Guard, PA) and taken to CT prior to stroke cart activation. Pt presents with c/o dizziness, RLE weakness, and decreased sensation to R side. mRS 0, LKW 1600 1856 [facility] paged. Pt return from [place] [facility] Quinn Axe, MD) connected via stroke cart and assessing pt at this time.

## 2022-11-18 NOTE — ED Notes (Signed)
Pt states weakness to the rt side starting at 1600 with other symptoms. Pt stood and turned w/ one assist from wheel chair to bed.

## 2022-11-18 NOTE — ED Triage Notes (Signed)
Pt complaining of dizziness that started around 4 pm. Michela Pitcher it feels like the last time he had a stroke, said that his heart feels like it is racing as well.  Pt is able to answer questions and there does not seem to be any motor deficits.

## 2022-11-18 NOTE — H&P (Signed)
History and Physical    Patient: Austin Bruce [id number] DOB: [birth year] DOA: 11/18/2022 DOS: the patient was seen and examined on 11/18/2022 PCP: [facility]  Patient coming from: Home  Chief Complaint:  Chief Complaint  Patient presents with   Dizziness   HPI: Austin Bruce is a 77 y.o. male with medical history significant of hypertension, hyperlipidemia, h/o CVA (L frontal), OSA intolerant to CPAP, apparently presents with c/o right sided weakness starting 4pm today. Pt states that he had just returned from [facility] when he started to feel weaker on the right side particularly the right leg.  Pt denies headache, vision change, slurred speech, cp, palp, sob, n/v, abd pain, diarrhea, brbpr, black stool, dysuria, hematuria.   In ED, T 97.9 P 91  Bp 153/102  R 18, pox 97% on RA  Wbc 10.7, Hgb 17.1, Plt 240 Na 132, K 2.9, Bun 21, Creat 1.35 Ast 33, Alt 46 Alk phos 57, T. Bili 1.3  Urinalysis negative  INR 1.0, PTT29   CT brain  FINDINGS: Brain: There is no evidence of an acute infarct, intracranial hemorrhage, mass, midline shift, or extra-axial fluid collection. Confluent hypodensities in the cerebral white matter are similar to the prior CT and are nonspecific but compatible with severe chronic small vessel ischemic disease. Small chronic infarcts are again noted in the right thalamus and right cerebellar hemisphere. Cerebral atrophy is mild for age.   Vascular: No hyperdense vessel.   Skull: No acute fracture or suspicious osseous lesion.   Sinuses/Orbits: Chronic right frontal sinus opacification. Clear mastoid air cells. Right cataract extraction.   Other: None.   ASPECTS [facility] Early CT Score)   - Ganglionic level infarction (caudate, lentiform nuclei, internal capsule, insula, M1-M3 cortex): 7   - Supraganglionic infarction (M4-M6 cortex): 3   Total score (0-10 with 10 being normal): 10   IMPRESSION: 1. No evidence of acute  intracranial abnormality. ASPECTS of 10. 2. Severe chronic small vessel ischemic disease.  CTA head/ neck   IMPRESSION: 1. No emergent large vessel occlusion or high-grade stenosis of the intracranial arteries. 2. Degraded perfusion scan without convincing core infarct or penumbra.  Pt will be admitted for right lower ext weakness r/o CVA, TIA  Review of Systems: negative for all organ systems except for + above    Past Medical History:  Diagnosis Date   Aphakia, right eye 07/02/2021   Onset post cataract surgery with dislocation of IOL 07/01/2021  Vitrectomy, insertion three-piece IOL sulcus 07/03/2021   Choroidal detachment, serous, right eye 08/12/2021   Late onset post vitrectomy for dislocated IOL, retained lens fragments and IOL repositioning  08/19/2021, resolved choroidal detachments post topical therapy with atropine   High cholesterol    Hypertension    OSA (obstructive sleep apnea)    Intolerant to CPAP   Stroke [facility])    Past Surgical History:  Procedure Laterality Date   CATARACT EXTRACTION Right 07/01/2021   Dislocated IOL   HAND SURGERY     HERNIA REPAIR     NOSE SURGERY     TONSILLECTOMY     Social History:  reports that he quit smoking about 35 years ago. His smoking use included cigarettes. He started smoking about 66 years ago. He smoked an average of 3 packs per day. He has never used smokeless tobacco. He reports that he does not currently use alcohol. He reports that he does not use drugs.  Allergies  Allergen Reactions   Penicillins Anaphylaxis  History reviewed. No pertinent family history.  Prior to Admission medications   Medication Sig Start Date End Date Taking? Authorizing Provider  ABRYSVO 120 MCG/0.5ML injection  08/18/22  Yes [provider]  amLODipine (NORVASC) 10 MG tablet Take 10 mg by mouth daily.   Yes [provider]  benzonatate (TESSALON) 100 MG capsule Take 100 mg by mouth 3 (three) times daily as needed.  06/14/22  Yes [provider]  clopidogrel (PLAVIX) 75 MG tablet Take 75 mg by mouth daily.   Yes [provider]  fluconazole (DIFLUCAN) 150 MG tablet Take 150 mg by mouth daily. 07/19/22  Yes [provider]  indapamide (LOZOL) 2.5 MG tablet Take 2.5 mg by mouth daily.   Yes [provider]  lisinopril (ZESTRIL) 40 MG tablet Take 40 mg by mouth daily.   Yes [provider]  loratadine (CLARITIN) 10 MG tablet Take 10 mg by mouth daily.   Yes [provider]  meclizine (ANTIVERT) 12.5 MG tablet Take 12.5 mg by mouth 3 (three) times daily as needed for nausea. 07/28/22  Yes [provider]  Omega-3 Fatty Acids (FISH OIL) 1200 MG CAPS Take 1 capsule by mouth daily.   Yes [provider]  OXYGEN Inhale 2 L into the lungs every evening.   Yes [provider]  potassium chloride SA (KLOR-CON M) 20 MEQ tablet Take 20 mEq by mouth 2 (two) times daily. 11/14/22  Yes [provider]  RESTASIS 0.05 % ophthalmic emulsion Apply to eye. 06/15/22  Yes [provider]  rosuvastatin (CRESTOR) 20 MG tablet Take 1 tablet (20 mg total) by mouth daily. 01/19/22  Yes Turner, Eber Hong, MD  tamsulosin (FLOMAX) 0.4 MG CAPS capsule Take 0.4 mg by mouth daily. 11/14/22  Yes [provider]  ezetimibe (ZETIA) 10 MG tablet Take 10 mg by mouth daily. Patient not taking: Reported on 11/18/2022 11/14/22   [provider]  propranolol ER (INDERAL LA) 80 MG 24 hr capsule  08/18/22   [provider]    Physical Exam: Vitals:   11/18/22 1813 11/18/22 1930 11/18/22 2001 11/18/22 2030  BP:  (!) 179/99 (!) 157/96 (!) 154/84  Pulse:  85 86 81  Resp:  '18 19 16  '$ Temp:      TempSrc:      SpO2:  93% 94% 94%  Weight: 101.2 kg     Height: '5\' 9"'$  (1.753 m)      Heent: anicteric, right pupil irregular 32m, left pupils 12m, direct consensual, near intact, eomi, tongue midline Neck: no bruit, no tm Heart: rrr s1,  s2, no m/g/r Lung: CTAB Abd: soft, obese, nt, nd, +bs Ext: no c/c/e,  slight hammer toes, dry skin , varicose veins on the distal lower ext, as well as near right shoulder, slight onychomycosis. Skin: no rash Lymph: no cervical adenopathy Neuro: cn2-12 intact, reflexes 2+ symmetric, diffuse with no clonus, downgoing toes left, equivocal on the right (likely downgoing), no pronator drift, motor 5/5 in all 4ext   Data Reviewed:  Assessment and Plan: RLE weakness CVA vs TIA Check MRI brain Check cardiac echo with bubble study Check CXR Check hga1c, lipid Cont plavix '75mg'$  po qday Start aspirin '81mg'$  po qday Permissive hypertension  AKI Hold Indapamide 2.'5mg'$  po qday Hydrate with ns iv Check bmp in am  Leukocytosis Check cbc in am  Hypokalemia Replete Cont potassium Check bmp in am  Hypertension Hold indapamide Cont lisinopril '40mg'$  po qday Start Amlodipine '10mg'$  po  qday after 24 hrs  Hyperlipidemia Cont Crestor '20mg'$  po qday  Bph Cont Flomax 0.'4mg'$  po qhs  DVT prophylaxis: lovenox FULL CODE Dispo: home , lives with wife  Pt will require observation admission < 2 nites stay, depending upon results of testing might require change to inpatient >2= nites stay.     Advance Care Planning:   Code Status: Prior FULL CODE  Consults: neurology (tele)  Family Communication:  with wife present  Severity of Illness: The appropriate patient status for this patient is OBSERVATION. Observation status is judged to be reasonable and necessary in order to provide the required intensity of service to ensure the patient's safety. The patient's presenting symptoms, physical exam findings, and initial radiographic and laboratory data in the context of their medical condition is felt to place them at decreased risk for further clinical deterioration. Furthermore, it is anticipated that the patient will be medically stable for discharge from the hospital within 2 midnights of admission.    Author: Jani Gravel, MD 11/18/2022 9:19 PM  For on call review [email or web address].

## 2022-11-18 NOTE — ED Notes (Signed)
Per neurologist pt is to have q 30 NIH checks and vital signs until 2030.

## 2022-11-19 ENCOUNTER — Observation Stay (HOSPITAL_COMMUNITY): Payer: Medicare Other

## 2022-11-19 ENCOUNTER — Telehealth: Payer: Self-pay | Admitting: *Deleted

## 2022-11-19 ENCOUNTER — Other Ambulatory Visit (HOSPITAL_COMMUNITY): Payer: Self-pay | Admitting: *Deleted

## 2022-11-19 ENCOUNTER — Observation Stay (HOSPITAL_BASED_OUTPATIENT_CLINIC_OR_DEPARTMENT_OTHER): Payer: Medicare Other

## 2022-11-19 ENCOUNTER — Other Ambulatory Visit: Payer: Medicare Other

## 2022-11-19 DIAGNOSIS — Z8673 Personal history of transient ischemic attack (TIA), and cerebral infarction without residual deficits: Secondary | ICD-10-CM

## 2022-11-19 DIAGNOSIS — R29898 Other symptoms and signs involving the musculoskeletal system: Secondary | ICD-10-CM | POA: Diagnosis not present

## 2022-11-19 DIAGNOSIS — G459 Transient cerebral ischemic attack, unspecified: Secondary | ICD-10-CM

## 2022-11-19 DIAGNOSIS — R531 Weakness: Secondary | ICD-10-CM | POA: Diagnosis not present

## 2022-11-19 DIAGNOSIS — E876 Hypokalemia: Secondary | ICD-10-CM | POA: Diagnosis not present

## 2022-11-19 LAB — COMPREHENSIVE METABOLIC PANEL
ALT: 42 U/L (ref 0–44)
AST: 27 U/L (ref 15–41)
Albumin: 3.7 g/dL (ref 3.5–5.0)
Alkaline Phosphatase: 50 U/L (ref 38–126)
Anion gap: 10 (ref 5–15)
BUN: 16 mg/dL (ref 8–23)
CO2: 25 mmol/L (ref 22–32)
Calcium: 8.8 mg/dL — ABNORMAL LOW (ref 8.9–10.3)
Chloride: 99 mmol/L (ref 98–111)
Creatinine, Ser: 1.09 mg/dL (ref 0.61–1.24)
GFR, Estimated: >60 mL/min (ref >60)
Glucose, Bld: 119 mg/dL — ABNORMAL HIGH (ref 70–99)
Potassium: 2.9 mmol/L — ABNORMAL LOW (ref 3.5–5.1)
Sodium: 134 mmol/L — ABNORMAL LOW (ref 135–145)
Total Bilirubin: 1.2 mg/dL (ref 0.3–1.2)
Total Protein: 6.8 g/dL (ref 6.5–8.1)

## 2022-11-19 LAB — LIPID PANEL
Cholesterol: 103 mg/dL (ref 0–200)
HDL: 32 mg/dL — ABNORMAL LOW (ref >40)
LDL Cholesterol: 29 mg/dL (ref 0–99)
Total CHOL/HDL Ratio: 3.2 RATIO
Triglycerides: 209 mg/dL — ABNORMAL HIGH (ref <150)
VLDL: 42 mg/dL — ABNORMAL HIGH (ref 0–40)

## 2022-11-19 LAB — ECHOCARDIOGRAM COMPLETE BUBBLE STUDY
Area-P 1/2: 2.69 cm2
Calc EF: 64.5 %
S' Lateral: 2.7 cm
Single Plane A2C EF: 63.6 %
Single Plane A4C EF: 64.4 %

## 2022-11-19 LAB — HEMOGLOBIN A1C
Hgb A1c MFr Bld: 5.9 % — ABNORMAL HIGH (ref 4.8–5.6)
Mean Plasma Glucose: 123 mg/dL

## 2022-11-19 MED ORDER — POTASSIUM CHLORIDE CRYS ER 20 MEQ PO TBCR
20.0000 meq | EXTENDED_RELEASE_TABLET | Freq: Two times a day (BID) | ORAL | Status: DC
  Administered 2022-11-19 (×2): 20 meq via ORAL
  Filled 2022-11-19 (×2): qty 1

## 2022-11-19 MED ORDER — POTASSIUM CHLORIDE CRYS ER 20 MEQ PO TBCR
40.0000 meq | EXTENDED_RELEASE_TABLET | Freq: Once | ORAL | Status: AC
  Administered 2022-11-19: 40 meq via ORAL
  Filled 2022-11-19: qty 2

## 2022-11-19 MED ORDER — TAMSULOSIN HCL 0.4 MG PO CAPS
0.4000 mg | ORAL_CAPSULE | Freq: Every day | ORAL | Status: DC
  Administered 2022-11-19: 0.4 mg via ORAL
  Filled 2022-11-19: qty 1

## 2022-11-19 MED ORDER — ASPIRIN 81 MG PO TBEC: 81.0000 mg | DELAYED_RELEASE_TABLET | Freq: Every day | ORAL | 0 refills | Status: AC

## 2022-11-19 NOTE — Telephone Encounter (Signed)
Received request for 14 day monitor for stroke. Pt enrolled in [place].

## 2022-11-19 NOTE — TOC Progression Note (Signed)
  Transition of Care [facility]) Screening Note   Patient Details  Name: DOMNICK ITKIN Date of Birth: 1946-07-20   Transition of Care [facility]) CM/SW Contact:    Shade Flood, LCSW Phone Number: 11/19/2022, 12:04 PM    Transition of Care Department [facility]) has reviewed patient and no TOC needs have been identified at this time. We will continue to monitor patient advancement through interdisciplinary progression rounds. If new patient transition needs arise, please place a TOC consult.

## 2022-11-19 NOTE — Discharge Instructions (Signed)
They will be sending you a cardiac monitor to wear for at least 14 days as part of your work up.  Please take aspirin 81 mg with plavix together for 3 weeks, then plavix alone   IMPORTANT INFORMATION: PAY CLOSE ATTENTION   PHYSICIAN DISCHARGE INSTRUCTIONS  Follow with Primary care provider  [facility]  and other consultants as instructed by your Hospitalist Physician  [place], WORSEN OR NEW PROBLEM DEVELOPS   Please note: You were cared for by a hospitalist during your hospital stay. Every effort will be made to forward records to your primary care provider.  You can request that your primary care provider send for your hospital records if they have not received them.  Once you are discharged, your primary care physician will handle any further medical issues. Please note that NO REFILLS for any discharge medications will be authorized once you are discharged, as it is imperative that you return to your primary care physician (or establish a relationship with a primary care physician if you do not have one) for your post hospital discharge needs so that they can reassess your need for medications and monitor your lab values.  Please get a complete blood count and chemistry panel checked by your Primary MD at your next visit, and again as instructed by your Primary MD.  Get Medicines reviewed and adjusted: Please take all your medications with you for your next visit with your Primary MD  Laboratory/radiological data: Please request your Primary MD to go over all hospital tests and procedure/radiological results at the follow up, please ask your primary care provider to get all Hospital records sent to his/her office.  In some cases, they will be blood work, cultures and biopsy results pending at the time of your discharge. Please request that your primary care provider follow up on these results.  If you are  diabetic, please bring your blood sugar readings with you to your follow up appointment with primary care.    Please call and make your follow up appointments as soon as possible.    Also Note the following: If you experience worsening of your admission symptoms, develop shortness of breath, life threatening emergency, suicidal or homicidal thoughts you must seek medical attention immediately by calling 911 or calling your MD immediately  if symptoms less severe.  You must read complete instructions/literature along with all the possible adverse reactions/side effects for all the Medicines you take and that have been prescribed to you. Take any new Medicines after you have completely understood and accpet all the possible adverse reactions/side effects.   Do not drive when taking Pain medications or sleeping medications (Benzodiazepines)  Do not take more than prescribed Pain, Sleep and Anxiety Medications. It is not advisable to combine anxiety,sleep and pain medications without talking with your primary care practitioner  Special Instructions: If you have smoked or chewed Tobacco  in the last 2 yrs please stop smoking, stop any regular Alcohol  and or any Recreational drug use.  Wear Seat belts while driving.  Do not drive if taking any narcotic, mind altering or controlled substances or recreational drugs or alcohol.

## 2022-11-19 NOTE — Evaluation (Signed)
Physical Therapy Evaluation Patient Details Name: Austin Bruce MRN: [id number] DOB: 1945-10-19 Today's Date: 11/19/2022  History of Present Illness  Austin Bruce is a 77 y.o. male with medical history significant of hypertension, hyperlipidemia, h/o CVA (L frontal), OSA intolerant to CPAP, apparently presents with c/o right sided weakness starting 4pm today. Pt states that he had just returned from [facility] when he started to feel weaker on the right side particularly the right leg.  Pt denies headache, vision change, slurred speech, cp, palp, sob, n/v, abd pain, diarrhea, brbpr, black stool, dysuria, hematuria.   Clinical Impression  Patient functioning near baseline for functional mobility and gait other than c/o mild numbness in RUE/LE.  Patient states he normally walks slower due to chronic back pain and demonstrates good return for ambulating in room/hallways without loss of balance.  Plan:  Patient discharged from physical therapy to care of nursing for ambulation daily as tolerated for length of stay.         Recommendations for follow up therapy are one component of a multi-disciplinary discharge planning process, led by the attending physician.  Recommendations may be updated based on patient status, additional functional criteria and insurance authorization.  Follow Up Recommendations No PT follow up      Assistance Recommended at Discharge PRN  Patient can return home with the following       Equipment Recommendations None recommended by PT  Recommendations for Other Services       Functional Status Assessment Patient has not had a recent decline in their functional status     Precautions / Restrictions Precautions Precautions: Fall Restrictions Weight Bearing Restrictions: No      Mobility  Bed Mobility Overal bed mobility: Independent                  Transfers Overall transfer level: Independent                       Ambulation/Gait Ambulation/Gait assistance: Modified independent (Device/Increase time) Gait Distance (Feet): 200 Feet Assistive device: None Gait Pattern/deviations: Decreased step length - left, Decreased stance time - right, Decreased stride length Gait velocity: decreased     General Gait Details: slightly labored cadence without loss of balance with good return for ambulating in room/hallways without loss of balance  Stairs            Wheelchair Mobility    Modified Rankin (Stroke Patients Only)       Balance Overall balance assessment: Independent                                           Pertinent Vitals/Pain Pain Assessment Pain Assessment: No/denies pain    Home Living Family/patient expects to be discharged to:: Private residence Living Arrangements: Spouse/significant other Available Help at Discharge: Family Type of Home: House Home Access: Stairs to enter Entrance Stairs-Rails: Right Entrance Stairs-Number of Steps: 3   Home Layout: One level Home Equipment: Conservation officer, nature (2 wheels);Shower seat      Prior Function Prior Level of Function : Independent/Modified Independent             Mobility Comments: 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 ADLs Comments: Pt reports he was/is independent with ADLs/IADLS     Hand Dominance   Dominant Hand: Right    Extremity/Trunk Assessment  Upper Extremity Assessment Upper Extremity Assessment: Defer to OT evaluation    Lower Extremity Assessment Lower Extremity Assessment: Overall WFL for tasks assessed    Cervical / Trunk Assessment Cervical / Trunk Assessment: Normal  Communication   Communication: HOH;No difficulties  Cognition Arousal/Alertness: Awake/alert Behavior During Therapy: WFL for tasks assessed/performed Overall Cognitive Status: Within Functional Limits for tasks assessed                                           General Comments      Exercises     Assessment/Plan    PT Assessment Patient does not need any further PT services  PT Problem List         PT Treatment Interventions      PT Goals (Current goals can be found in the Care Plan section)  Acute Rehab PT Goals Patient Stated Goal: return home PT Goal Formulation: With patient/family Time For Goal Achievement: 11/19/22 Potential to Achieve Goals: Good    Frequency       Co-evaluation PT/OT/SLP Co-Evaluation/Treatment: Yes Reason for Co-Treatment: To address functional/ADL transfers PT goals addressed during session: Mobility/safety with mobility;Balance OT goals addressed during session: ADL's and self-care       AM-PAC PT "6 Clicks" Mobility  Outcome Measure Help needed turning from your back to your side while in a flat bed without using bedrails?: None Help needed moving from lying on your back to sitting on the side of a flat bed without using bedrails?: None Help needed moving to and from a bed to a chair (including a wheelchair)?: None Help needed standing up from a chair using your arms (e.g., wheelchair or bedside chair)?: None Help needed to walk in hospital room?: None Help needed climbing 3-5 steps with a railing? : None 6 Click Score: 24    End of Session   Activity Tolerance: Patient tolerated treatment well Patient left: in bed;with call bell/phone within reach;with family/visitor present Nurse Communication: Mobility status PT Visit Diagnosis: Unsteadiness on feet (R26.81);Other abnormalities of gait and mobility (R26.89);Muscle weakness (generalized) (M62.81)    Time: [id number] PT Time Calculation (min) (ACUTE ONLY): 20 min   Charges:   PT Evaluation $PT Eval Moderate Complexity: 1 Mod PT Treatments $Therapeutic Activity: 8-22 mins        12:00 PM, 11/19/22 Lonell Grandchild, MPT Physical Therapist with [facility] 336 [phone] office [phone] mobile phone

## 2022-11-19 NOTE — Progress Notes (Signed)
SLP Cancellation Note  Patient Details Name: Austin Bruce MRN: [id number] DOB: May 31, 1946   Cancelled treatment:       Reason Eval/Treat Not Completed: SLP screened, no needs identified, will sign off. Pt's cognition, speech and language are functioning at or near baseline. Thank you for this referral,  [name] H. Roddie Mc, CCC-SLP Speech Language Pathologist    Wende Bushy 11/19/2022, 2:13 PM

## 2022-11-19 NOTE — Progress Notes (Addendum)
Patient admitted to Rm 341 from ED via stretcher, stood and moved to bed. A & O x4. Welcomed to unit, spouse at bedside overnight. NIH score 2.

## 2022-11-19 NOTE — Progress Notes (Signed)
  Echocardiogram 2D Echocardiogram has been performed.  Austin Bruce 11/19/2022, 11:43 AM

## 2022-11-19 NOTE — Discharge Summary (Addendum)
Physician Discharge Summary  Austin Bruce [id number] DOB: 1946-07-05 DOA: 11/18/2022  PCP: [facility]  Admit date: 11/18/2022 Discharge date: 11/19/2022  Admitted From:  Home  Disposition: Home   Recommendations for Outpatient Follow-up:  Follow up with PCP in 1 weeks 14 day cardiac event monitor requested for patient to be provided by [facility]  Take plavix and aspirin 81 mg daily x 21 days followed by plavix alone    Home Health: n/a  Discharge Condition: STABLE   CODE STATUS: FULL DIET: heart healthy low sodium    Brief Hospitalization Summary: Please see all hospital notes, images, labs for full details of the hospitalization. ADMISSION HPI: 77 y.o. male with medical history significant of hypertension, hyperlipidemia, h/o CVA (L frontal), OSA intolerant to CPAP, apparently presents with c/o right sided weakness starting 4pm today. Pt states that he had just returned from [facility] when he started to feel weaker on the right side particularly the right leg.  Pt denies headache, vision change, slurred speech, cp, palp, sob, n/v, abd pain, diarrhea, brbpr, black stool, dysuria, hematuria.   Hospital Course:  Pt was admitted into the hospital for concern for CVA.  He was evaluated by teleneurology and he was started on a full stroke workup including PT OT SLP evaluation.  He was sent for a 2D echocardiogram.  He was sent for MRI brain.  Fortunately no signs of acute CVA noted on MRI.  Discussed with Dr. Hortense Ramal, continue recs placed by Dr. Quinn Axe.  He will take aspirin 81 mg and plavix 75 mg daily x 3 weeks followed by plavix alone.  His LDL cholesterol was optimally controlled on rosuvastatin.   He had no PT/OT/SLP needs.  Hypokalemia was treated with supplemental potassium.  He feels well and eager to go home.  I sent a request to [facility] ([employer]) for a 14 day cardiac monitor as recommended by Dr. Quinn Axe.  Pt is stable to discharge home.  Follow up with  PCP in 1 week.    Discharge Diagnoses:  Principal Problem:   Right leg weakness Active Problems:   History of CVA (cerebrovascular accident)   Hypertension   High cholesterol   Hypokalemia   Hyponatremia   Weakness of right lower extremity   Discharge Instructions:  Allergies as of 11/19/2022       Reactions   Penicillins Anaphylaxis        Medication List     STOP taking these medications    ezetimibe 10 MG tablet Commonly known as: ZETIA   fluconazole 150 MG tablet Commonly known as: DIFLUCAN   propranolol ER 80 MG 24 hr capsule Commonly known as: INDERAL LA       TAKE these medications    Abrysvo 120 MCG/0.5ML injection Generic drug: RSV bivalent vaccine   amLODipine 10 MG tablet Commonly known as: NORVASC Take 10 mg by mouth daily.   aspirin EC 81 MG tablet Take 1 tablet (81 mg total) by mouth daily for 20 days. Swallow whole. Start taking on: November 20, 2022   benzonatate 100 MG capsule Commonly known as: TESSALON Take 100 mg by mouth 3 (three) times daily as needed.   clopidogrel 75 MG tablet Commonly known as: PLAVIX Take 75 mg by mouth daily.   Fish Oil 1200 MG Caps Take 1 capsule by mouth daily.   indapamide 2.5 MG tablet Commonly known as: LOZOL Take 2.5 mg by mouth daily.   lisinopril 40 MG tablet Commonly known as:  ZESTRIL Take 40 mg by mouth daily.   loratadine 10 MG tablet Commonly known as: CLARITIN Take 10 mg by mouth daily.   meclizine 12.5 MG tablet Commonly known as: ANTIVERT Take 12.5 mg by mouth 3 (three) times daily as needed for nausea.   OXYGEN Inhale 2 L into the lungs every evening.   potassium chloride SA 20 MEQ tablet Commonly known as: KLOR-CON M Take 20 mEq by mouth 2 (two) times daily.   Restasis 0.05 % ophthalmic emulsion Generic drug: cycloSPORINE Apply to eye.   rosuvastatin 20 MG tablet Commonly known as: CRESTOR Take 1 tablet (20 mg total) by mouth daily.   tamsulosin 0.4 MG Caps  capsule Commonly known as: FLOMAX Take 0.4 mg by mouth daily.        Follow-up Information     [facility]. Schedule an appointment as soon as possible for a visit in 1 week(s).   Why: Hospital Follow Up Contact information: [place] [address] [phone]                Allergies  Allergen Reactions   Penicillins Anaphylaxis   Allergies as of 11/19/2022       Reactions   Penicillins Anaphylaxis        Medication List     STOP taking these medications    ezetimibe 10 MG tablet Commonly known as: ZETIA   fluconazole 150 MG tablet Commonly known as: DIFLUCAN   propranolol ER 80 MG 24 hr capsule Commonly known as: INDERAL LA       TAKE these medications    Abrysvo 120 MCG/0.5ML injection Generic drug: RSV bivalent vaccine   amLODipine 10 MG tablet Commonly known as: NORVASC Take 10 mg by mouth daily.   aspirin EC 81 MG tablet Take 1 tablet (81 mg total) by mouth daily for 20 days. Swallow whole. Start taking on: November 20, 2022   benzonatate 100 MG capsule Commonly known as: TESSALON Take 100 mg by mouth 3 (three) times daily as needed.   clopidogrel 75 MG tablet Commonly known as: PLAVIX Take 75 mg by mouth daily.   Fish Oil 1200 MG Caps Take 1 capsule by mouth daily.   indapamide 2.5 MG tablet Commonly known as: LOZOL Take 2.5 mg by mouth daily.   lisinopril 40 MG tablet Commonly known as: ZESTRIL Take 40 mg by mouth daily.   loratadine 10 MG tablet Commonly known as: CLARITIN Take 10 mg by mouth daily.   meclizine 12.5 MG tablet Commonly known as: ANTIVERT Take 12.5 mg by mouth 3 (three) times daily as needed for nausea.   OXYGEN Inhale 2 L into the lungs every evening.   potassium chloride SA 20 MEQ tablet Commonly known as: KLOR-CON M Take 20 mEq by mouth 2 (two) times daily.   Restasis 0.05 % ophthalmic emulsion Generic drug: cycloSPORINE Apply to eye.   rosuvastatin 20 MG  tablet Commonly known as: CRESTOR Take 1 tablet (20 mg total) by mouth daily.   tamsulosin 0.4 MG Caps capsule Commonly known as: FLOMAX Take 0.4 mg by mouth daily.        Procedures/Studies: MR BRAIN WO CONTRAST  Result Date: 11/19/2022 CLINICAL DATA:  Transient ischemic attack. Right lower extremity weakness. EXAM: MRI HEAD WITHOUT CONTRAST TECHNIQUE: Multiplanar, multiecho pulse sequences of the brain and surrounding structures were obtained without intravenous contrast. COMPARISON:  MRI brain 03/28/2021. CT head and CTA head/neck 11/18/2022. FINDINGS: Brain: No acute infarct or hemorrhage. Unchanged  severe chronic small-vessel disease with old lacunar infarcts in the right thalamus, left basal ganglia and bilateral cerebellar hemispheres. Chronic microhemorrhages in the bilateral occipital and parietal lobes, as well as the bilateral cerebellar hemispheres. No hydrocephalus or extra-axial collection. No mass effect or midline shift. Vascular: Normal flow voids. Skull and upper cervical spine: Normal marrow signal. Sinuses/Orbits: Mild mucosal disease in the right maxillary and right frontal sinuses. Orbits are unremarkable. Other: None. IMPRESSION: 1. No acute intracranial process. 2. Unchanged severe chronic small-vessel disease with old lacunar infarcts in the right thalamus, left basal ganglia, and bilateral cerebellar hemispheres. 3. Scattered cortical and subcortical microhemorrhages raises the possibility of cerebral amyloid angiopathy. Electronically Signed   By: Emmit Alexanders M.D.   On: 11/19/2022 13:16   ECHOCARDIOGRAM COMPLETE BUBBLE STUDY  Result Date: 11/19/2022    ECHOCARDIOGRAM REPORT   Patient Name:   Austin Bruce Date of Exam: 11/19/2022 Medical Rec #:  [id number]    Height:       69.0 in Accession #:    [id number]   Weight:       223.0 lb Date of Birth:  Apr 11, 1946    BSA:          2.164 m Patient Age:    48 years     BP:           171/95 mmHg Patient Gender: M            HR:            60 bpm. Exam Location:  [facility] Procedure: 2D Echo, Cardiac Doppler, Color Doppler and Saline Contrast Bubble            Study Indications:    TIA  History:        Patient has prior history of Echocardiogram examinations, most                 recent 03/29/2021. Stroke, Signs/Symptoms:Dyspnea; Risk                 Factors:Hypertension, Dyslipidemia and Former Smoker.  Sonographer:    Roseanna Rainbow RDCS Referring Phys: [place]  1. Left ventricular ejection fraction, by estimation, is 60 to 65%. The left ventricle has normal function. The left ventricle has no regional wall motion abnormalities. Left ventricular diastolic parameters are consistent with Grade I diastolic dysfunction (impaired relaxation).  2. Right ventricular systolic function is normal. The right ventricular size is normal. Tricuspid regurgitation signal is inadequate for assessing PA pressure.  3. The mitral valve is grossly normal. No evidence of mitral valve regurgitation. No evidence of mitral stenosis.  4. The aortic valve was not well visualized. Aortic valve regurgitation is not visualized. No aortic stenosis is present.  5. The inferior vena cava is normal in size with greater than 50% respiratory variability, suggesting right atrial pressure of 3 mmHg.  6. Agitated saline contrast bubble study was negative, with no evidence of any interatrial shunt. Comparison(s): No significant change from prior study. FINDINGS  Left Ventricle: Left ventricular ejection fraction, by estimation, is 60 to 65%. The left ventricle has normal function. The left ventricle has no regional wall motion abnormalities. The left ventricular internal cavity size was normal in size. There is  no left ventricular hypertrophy. Left ventricular diastolic parameters are consistent with Grade I diastolic dysfunction (impaired relaxation). Right Ventricle: The right ventricular size is normal. No increase in right ventricular wall thickness. Right  ventricular systolic function is normal. Tricuspid regurgitation  signal is inadequate for assessing PA pressure. Left Atrium: Left atrial size was normal in size. Right Atrium: Right atrial size was normal in size. Pericardium: There is no evidence of pericardial effusion. Mitral Valve: The mitral valve is grossly normal. No evidence of mitral valve regurgitation. No evidence of mitral valve stenosis. Tricuspid Valve: The tricuspid valve is grossly normal. Tricuspid valve regurgitation is not demonstrated. No evidence of tricuspid stenosis. Aortic Valve: The aortic valve was not well visualized. Aortic valve regurgitation is not visualized. No aortic stenosis is present. Pulmonic Valve: The pulmonic valve was not well visualized. Pulmonic valve regurgitation is not visualized. No evidence of pulmonic stenosis. Aorta: The aortic root and ascending aorta are structurally normal, with no evidence of dilitation. Venous: The inferior vena cava is normal in size with greater than 50% respiratory variability, suggesting right atrial pressure of 3 mmHg. IAS/Shunts: No atrial level shunt detected by color flow Doppler. Agitated saline contrast was given intravenously to evaluate for intracardiac shunting. Agitated saline contrast bubble study was negative, with no evidence of any interatrial shunt.  LEFT VENTRICLE PLAX 2D LVIDd:         4.40 cm     Diastology LVIDs:         2.70 cm     LV e' medial:    4.68 cm/s LV PW:         0.90 cm     LV E/e' medial:  10.8 LV IVS:        1.10 cm     LV e' lateral:   5.77 cm/s LVOT diam:     2.30 cm     LV E/e' lateral: 8.8 LV SV:         93 LV SV Index:   43 LVOT Area:     4.15 cm  LV Volumes (MOD) LV vol d, MOD A2C: 78.5 ml LV vol d, MOD A4C: 97.0 ml LV vol s, MOD A2C: 28.6 ml LV vol s, MOD A4C: 34.5 ml LV SV MOD A2C:     50.0 ml LV SV MOD A4C:     97.0 ml LV SV MOD BP:      57.2 ml RIGHT VENTRICLE             IVC RV S prime:     13.20 cm/s  IVC diam: 1.90 cm TAPSE (M-mode): 1.2 cm  LEFT ATRIUM             Index LA diam:        3.30 cm 1.53 cm/m LA Vol (A2C):   19.4 ml 8.97 ml/m LA Vol (A4C):   35.8 ml 16.54 ml/m LA Biplane Vol: 29.1 ml 13.45 ml/m  AORTIC VALVE LVOT Vmax:   113.00 cm/s LVOT Vmean:  73.500 cm/s LVOT VTI:    0.224 m  AORTA Ao Root diam: 3.45 cm Ao Asc diam:  3.00 cm MITRAL VALVE MV Area (PHT): 2.69 cm    SHUNTS MV Decel Time: 282 msec    Systemic VTI:  0.22 m MV E velocity: 50.60 cm/s  Systemic Diam: 2.30 cm MV A velocity: 94.05 cm/s MV E/A ratio:  0.54 Vishnu Priya Mallipeddi Electronically signed by Lorelee Cover Mallipeddi Signature Date/Time: 11/19/2022/12:34:12 PM    Final    DG Chest 2 View  Result Date: 11/18/2022 CLINICAL DATA:  [id number] Weakness [id number] EXAM: CHEST - 2 VIEW COMPARISON:  None Available. FINDINGS: The heart and mediastinal contours are within normal limits. No focal consolidation. No pulmonary edema. No  pleural effusion. No pneumothorax. No acute osseous abnormality. IMPRESSION: No active cardiopulmonary disease. Electronically Signed   By: Iven Finn M.D.   On: 11/18/2022 23:01   CT ANGIO HEAD NECK W WO CM W PERF (CODE STROKE)  Result Date: 11/18/2022 CLINICAL DATA:  Dizziness and right-sided weakness EXAM: CT ANGIOGRAPHY HEAD AND NECK CT PERFUSION BRAIN TECHNIQUE: Multidetector CT imaging of the head and neck was performed using the standard protocol during bolus administration of intravenous contrast. Multiplanar CT image reconstructions and MIPs were obtained to evaluate the vascular anatomy. Carotid stenosis measurements (when applicable) are obtained utilizing NASCET criteria, using the distal internal carotid diameter as the denominator. Multiphase CT imaging of the brain was performed following IV bolus contrast injection. Subsequent parametric perfusion maps were calculated using RAPID software. RADIATION DOSE REDUCTION: This exam was performed according to the departmental dose-optimization program which includes automated exposure  control, adjustment of the mA and/or kV according to patient size and/or use of iterative reconstruction technique. CONTRAST:  169mL OMNIPAQUE IOHEXOL 350 MG/ML SOLN COMPARISON:  None Available. FINDINGS: CTA NECK FINDINGS SKELETON: There is no bony spinal canal stenosis. No lytic or blastic lesion. OTHER NECK: Normal pharynx, larynx and major salivary glands. No cervical lymphadenopathy. Unremarkable thyroid gland. UPPER CHEST: No pneumothorax or pleural effusion. No nodules or masses. AORTIC ARCH: There is no calcific atherosclerosis of the aortic arch. There is no aneurysm, dissection or hemodynamically significant stenosis of the visualized portion of the aorta. Conventional 3 vessel aortic branching pattern. The visualized proximal subclavian arteries are widely patent. RIGHT CAROTID SYSTEM: Normal without aneurysm, dissection or stenosis. LEFT CAROTID SYSTEM: Normal without aneurysm, dissection or stenosis. VERTEBRAL ARTERIES: Left dominant configuration. Both origins are clearly patent. There is no dissection, occlusion or flow-limiting stenosis to the skull base (V1-V3 segments). CTA HEAD FINDINGS POSTERIOR CIRCULATION: --Vertebral arteries: Normal V4 segments. --Inferior cerebellar arteries: Normal. --Basilar artery: Normal. --Superior cerebellar arteries: Normal. --Posterior cerebral arteries (PCA): Normal. ANTERIOR CIRCULATION: --Intracranial internal carotid arteries: Normal. --Anterior cerebral arteries (ACA): Normal. Both A1 segments are present. Patent anterior communicating artery (a-comm). --Middle cerebral arteries (MCA): Normal. VENOUS SINUSES: As permitted by contrast timing, patent. ANATOMIC VARIANTS: None Review of the MIP images confirms the above findings. CT Brain Perfusion Findings: ASPECTS: 10 CBF (<30%) Volume: 38mL Perfusion (Tmax>6.0s) volume: 67mL reported, but this does not correspond to the reported clinical history. Additionally, there is moderate motion degradation and a suboptimal  time attenuation curve. Mismatch Volume: 9mL reported Infarction Location:None IMPRESSION: 1. No emergent large vessel occlusion or high-grade stenosis of the intracranial arteries. 2. Degraded perfusion scan without convincing core infarct or penumbra. Electronically Signed   By: Ulyses Jarred M.D.   On: 11/18/2022 19:22   CT HEAD CODE STROKE WO CONTRAST  Result Date: 11/18/2022 CLINICAL DATA:  Code stroke. Neuro deficit, acute, stroke suspected. Right-sided weakness. EXAM: CT HEAD WITHOUT CONTRAST TECHNIQUE: Contiguous axial images were obtained from the base of the skull through the vertex without intravenous contrast. RADIATION DOSE REDUCTION: This exam was performed according to the departmental dose-optimization program which includes automated exposure control, adjustment of the mA and/or kV according to patient size and/or use of iterative reconstruction technique. COMPARISON:  Head CT and MRI 03/28/2021 FINDINGS: Brain: There is no evidence of an acute infarct, intracranial hemorrhage, mass, midline shift, or extra-axial fluid collection. Confluent hypodensities in the cerebral white matter are similar to the prior CT and are nonspecific but compatible with severe chronic small vessel ischemic disease. Small chronic infarcts are again  noted in the right thalamus and right cerebellar hemisphere. Cerebral atrophy is mild for age. Vascular: No hyperdense vessel. Skull: No acute fracture or suspicious osseous lesion. Sinuses/Orbits: Chronic right frontal sinus opacification. Clear mastoid air cells. Right cataract extraction. Other: None. ASPECTS [facility] Stroke Program Early CT Score) - Ganglionic level infarction (caudate, lentiform nuclei, internal capsule, insula, M1-M3 cortex): 7 - Supraganglionic infarction (M4-M6 cortex): 3 Total score (0-10 with 10 being normal): 10 IMPRESSION: 1. No evidence of acute intracranial abnormality. ASPECTS of 10. 2. Severe chronic small vessel ischemic disease. These  results were called by telephone at the time of interpretation on 11/18/2022 at 6:50 pm to Dr. Vanita Panda, who verbally acknowledged these results. Electronically Signed   By: Logan Bores M.D.   On: 11/18/2022 18:51     Subjective: Pt feels like he is back to his baseline and his symptoms have resolved   Discharge Exam: Vitals:   11/19/22 1148 11/19/22 1300  BP: (!) 159/88 (!) 188/87  Pulse: 63 62  Resp: 12 14  Temp: 97.8 F (36.6 C) 98.2 F (36.8 C)  SpO2:  92%   Vitals:   11/19/22 0557 11/19/22 0730 11/19/22 1148 11/19/22 1300  BP: (!) 155/85 (!) 171/95 (!) 159/88 (!) 188/87  Pulse: (!) 58 65 63 62  Resp: 18 12 12 14   Temp: (!) 97.5 F (36.4 C) (!) 97.5 F (36.4 C) 97.8 F (36.6 C) 98.2 F (36.8 C)  TempSrc: Oral Oral Oral Oral  SpO2: 97% 96%  92%  Weight:      Height:        General: Pt is alert, awake, not in acute distress Cardiovascular: RRR, S1/S2 +, no rubs, no gallops Respiratory: CTA bilaterally, no wheezing, no rhonchi Abdominal: Soft, NT, ND, bowel sounds + Extremities: no edema, no cyanosis Neurological: nonfocal exam.    The results of significant diagnostics from this hospitalization (including imaging, microbiology, ancillary and laboratory) are listed below for reference.     Microbiology: No results found for this or any previous visit (from the past 240 hour(s)).   Labs: BNP (last 3 results) No results for input(s): "BNP" in the last 8760 hours. Basic Metabolic Panel: Recent Labs  Lab 11/18/22 1835 11/19/22 0533  NA 132* 134*  K 2.9* 2.9*  CL 94* 99  CO2 24 25  GLUCOSE 137* 119*  BUN 21 16  CREATININE 1.35* 1.09  CALCIUM 9.3 8.8*   Liver Function Tests: Recent Labs  Lab 11/18/22 1835 11/19/22 0533  AST 33 27  ALT 46* 42  ALKPHOS 57 50  BILITOT 1.3* 1.2  PROT 7.3 6.8  ALBUMIN 4.1 3.7   No results for input(s): "LIPASE", "AMYLASE" in the last 168 hours. No results for input(s): "AMMONIA" in the last 168 hours. CBC: Recent  Labs  Lab 11/18/22 1835  WBC 10.7*  NEUTROABS 7.3  HGB 17.1*  HCT 46.6  MCV 88.9  PLT 240   Cardiac Enzymes: No results for input(s): "CKTOTAL", "CKMB", "CKMBINDEX", "TROPONINI" in the last 168 hours. BNP: Invalid input(s): "POCBNP" CBG: No results for input(s): "GLUCAP" in the last 168 hours. D-Dimer No results for input(s): "DDIMER" in the last 72 hours. Hgb A1c No results for input(s): "HGBA1C" in the last 72 hours. Lipid Profile Recent Labs    11/19/22 0533  CHOL 103  HDL 32*  LDLCALC 29  TRIG 209*  CHOLHDL 3.2   Thyroid function studies No results for input(s): "TSH", "T4TOTAL", "T3FREE", "THYROIDAB" in the last 72 hours.  Invalid input(s): "  FREET3" Anemia work up No results for input(s): "VITAMINB12", "FOLATE", "FERRITIN", "TIBC", "IRON", "RETICCTPCT" in the last 72 hours. Urinalysis    Component Value Date/Time   COLORURINE YELLOW 11/18/2022 1920   APPEARANCEUR CLEAR 11/18/2022 1920   LABSPEC 1.010 11/18/2022 1920   PHURINE 7.0 11/18/2022 1920   GLUCOSEU NEGATIVE 11/18/2022 1920   HGBUR NEGATIVE 11/18/2022 1920   BILIRUBINUR NEGATIVE 11/18/2022 1920   KETONESUR NEGATIVE 11/18/2022 1920   PROTEINUR NEGATIVE 11/18/2022 1920   NITRITE NEGATIVE 11/18/2022 1920   LEUKOCYTESUR NEGATIVE 11/18/2022 1920   Sepsis Labs Recent Labs  Lab 11/18/22 1835  WBC 10.7*   Microbiology No results found for this or any previous visit (from the past 240 hour(s)).  Time coordinating discharge:   SIGNED:  Irwin Brakeman, MD  [facility] 11/19/2022, 2:37 PM How to contact the [facility] Attending or Consulting provider [place] or covering provider during after hours [place], for this patient?  Check the care team in [facility] and look for a) attending/consulting [facility] provider listed and b) the [facility] team listed Log into [email or web address] and use [place]'s universal password to access. If you do not have the password, please contact the hospital operator. Locate the [facility]  provider you are looking for under [facility] and page to a number that you can be directly reached. If you still have difficulty reaching the provider, please page the [facility] (Director on Call) for the Hospitalists listed on amion for assistance.

## 2022-11-19 NOTE — Evaluation (Signed)
Occupational Therapy Evaluation Patient Details Name: Austin Bruce MRN: [id number] DOB: 1945-09-30 Today's Date: 11/19/2022   History of Present Illness Austin Bruce is a 77 y.o. male with medical history significant of hypertension, hyperlipidemia, h/o CVA (L frontal), OSA intolerant to CPAP, apparently presents with c/o right sided weakness starting 4pm today. Pt states that he had just returned from [facility] when he started to feel weaker on the right side particularly the right leg.  Pt denies headache, vision change, slurred speech, cp, palp, sob, n/v, abd pain, diarrhea, brbpr, black stool, dysuria, hematuria.   Clinical Impression   Pt agreeable to OT/PT evaluation  and participated well in session. Pt reports that he had a previous CVA but does not have remaining deficits/weakness. Pt stated that he lives at home with his wife who is available 24/7 to provide assist if needed. UE ROM/strength was assessed, pt demonstrated BL UE ROM WFL and 5/5 MMT on BL UE. Pt did report " a little numbness" in RUE when assessing sensation. Pt demonstrated ability to complete bed mobility independently, completed sit to stand t/f and toilet t/f with supervision, and completed functional mobilty around room/bathroom/unit with supervision. Pt was on O2 upon arrival and stated that he only requires O2 while sleeping, pt maintained spO2 at 97%+ throughout session without use of O2. Pt reports he is feeling back to baseline and feels comfortable returning home. Pt does not require further OT services while in acute setting and is safe to D/C when medically cleared.      Recommendations for follow up therapy are one component of a multi-disciplinary discharge planning process, led by the attending physician.  Recommendations may be updated based on patient status, additional functional criteria and insurance authorization.   Follow Up Recommendations  No OT follow up     Assistance Recommended at Discharge PRN         Functional Status Assessment  Patient has had a recent decline in their functional status and demonstrates the ability to make significant improvements in function in a reasonable and predictable amount of time.  Equipment Recommendations       Recommendations for Other Services       Precautions / Restrictions Precautions Precautions: Fall Restrictions Weight Bearing Restrictions: No      Mobility Bed Mobility Overal bed mobility: Independent (Pt completed bed mobility going from supine to sittign EOB independently)                  Transfers Overall transfer level: Independent Equipment used: None               General transfer comment: Pt completed sit to stand t/f with supervision, toilet transfer with supervision      Balance Overall balance assessment: Independent                                         ADL either performed or assessed with clinical judgement   ADL Overall ADL's : At baseline                                             Vision Baseline Vision/History: 4 Cataracts;1 Wears glasses (Pt reports constant blurry vision in R eye and has had cataract surgery) Ability to See in Adequate  Light: 0 Adequate Patient Visual Report: No change from baseline Vision Assessment?: No apparent visual deficits     Perception Perception Perception Tested?: No   Praxis      Pertinent Vitals/Pain Pain Assessment Pain Assessment: No/denies pain     Hand Dominance Right   Extremity/Trunk Assessment Upper Extremity Assessment Upper Extremity Assessment: Overall WFL for tasks assessed (Pt demonstrates WFL ROM and strength in RUE/LUE. He does reports a little numbess in RUE)   Lower Extremity Assessment Lower Extremity Assessment: Defer to PT evaluation   Cervical / Trunk Assessment Cervical / Trunk Assessment: Normal   Communication Communication Communication: HOH;No difficulties   Cognition  Arousal/Alertness: Awake/alert Behavior During Therapy: WFL for tasks assessed/performed Overall Cognitive Status: Within Functional Limits for tasks assessed                                                        Home Living Family/patient expects to be discharged to:: Private residence Living Arrangements: Spouse/significant other Available Help at Discharge: Family Type of Home: House Home Access: Stairs to enter Technical brewer of Steps: 3 Entrance Stairs-Rails: Right Home Layout: One level     Bathroom Shower/Tub: Occupational psychologist: Standard Bathroom Accessibility: Yes How Accessible: Accessible via walker Home Equipment: [place] (2 wheels);Shower seat          Prior Functioning/Environment Prior Level of Function : Independent/Modified Independent             Mobility Comments: 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 ADLs Comments: Pt reports he was/is independent with ADLs/IADLS                                Co-evaluation PT/OT/SLP Co-Evaluation/Treatment: Yes Reason for Co-Treatment: To address functional/ADL transfers   OT goals addressed during session: ADL's and self-care      AM-PAC OT "6 Clicks" Daily Activity     Outcome Measure Help from another person eating meals?: None Help from another person taking care of personal grooming?: None Help from another person toileting, which includes using toliet, bedpan, or urinal?: None Help from another person bathing (including washing, rinsing, drying)?: None Help from another person to put on and taking off regular upper body clothing?: None Help from another person to put on and taking off regular lower body clothing?: None 6 Click Score: 24   End of Session Nurse Communication: Mobility status  Activity Tolerance: Patient tolerated treatment well Patient left: in bed;with family/visitor present;with call  bell/phone within reach  OT Visit Diagnosis: Muscle weakness (generalized) (M62.81)                Time: [id number] OT Time Calculation (min): 14 min Charges:  OT General Charges $OT Visit: 1 Visit OT Evaluation $OT Eval Low Complexity: 1 Low   Frederic Jericho, OTR/L  11/19/2022, 9:05 AM

## 2022-11-20 LAB — HEMOGLOBIN A1C
Hgb A1c MFr Bld: 5.8 % — ABNORMAL HIGH (ref 4.8–5.6)
Mean Plasma Glucose: 120 mg/dL

## 2022-11-23 DIAGNOSIS — E039 Hypothyroidism, unspecified: Secondary | ICD-10-CM | POA: Diagnosis not present

## 2022-12-01 DIAGNOSIS — M25569 Pain in unspecified knee: Secondary | ICD-10-CM | POA: Diagnosis not present

## 2022-12-02 DIAGNOSIS — S8991XS Unspecified injury of right lower leg, sequela: Secondary | ICD-10-CM | POA: Diagnosis not present

## 2022-12-02 DIAGNOSIS — M2391 Unspecified internal derangement of right knee: Secondary | ICD-10-CM | POA: Diagnosis not present

## 2022-12-06 ENCOUNTER — Ambulatory Visit: Payer: Medicare Other | Admitting: Neurology

## 2022-12-06 ENCOUNTER — Ambulatory Visit: Payer: Medicare Other | Attending: Physician Assistant | Admitting: Physician Assistant

## 2022-12-06 NOTE — Progress Notes (Deleted)
Office Visit    Patient Name: Austin Bruce Date of Encounter: 12/06/2022  PCP:  Practice, [facility]  Cardiologist:  Fransico Him, MD  Advanced Practice Provider:  No care team member to display Electrophysiologist:  None    HPI    Austin Bruce is a 77 y.o. male with a past medical history significant for hypertension, hyperlipidemia, TIA in 2019 in [facility] and then status post CVA 03/29/2021 presents today for follow-up visit.  His MRI at that time revealed a small acute or subacute white matter infarct in the left frontal lobe with severe chronic small vessel ischemic disease and chronic lacunar infarcts in the cerebrum and cerebellum.  Aspirin was stopped due to his history of GI bleed.  2D echocardiogram was normal with LVEF 66 5%.  Patient had a 30-day monitor placed for CVA.  Only 9 days were analyzed but he was in normal sinus rhythm without evidence of A-fib.  He also had sleep apnea but was not treated because he could not use the machine.  He says that the CPAP device kept him awake.  Seen by Dr. Radford Pax 12/03/2021 and at that time was doing well without any symptoms.  He did endorse to chronic vertigo.  Compliant with medications.  Today, he***  Past Medical History    Past Medical History:  Diagnosis Date   Aphakia, right eye 07/02/2021   Onset post cataract surgery with dislocation of IOL 07/01/2021  Vitrectomy, insertion three-piece IOL sulcus 07/03/2021   Choroidal detachment, serous, right eye 08/12/2021   Late onset post vitrectomy for dislocated IOL, retained lens fragments and IOL repositioning  08/19/2021, resolved choroidal detachments post topical therapy with atropine   High cholesterol    Hypertension    OSA (obstructive sleep apnea)    Intolerant to CPAP   Stroke [facility])    Past Surgical History:  Procedure Laterality Date   CATARACT EXTRACTION Right 07/01/2021   Dislocated IOL   HAND SURGERY     HERNIA REPAIR      NOSE SURGERY     TONSILLECTOMY      Allergies  Allergies  Allergen Reactions   Penicillins Anaphylaxis   EKGs/Labs/Other Studies Reviewed:   The following studies were reviewed today: IMPRESSIONS    1. Left ventricular ejection fraction, by estimation, is 60 to 65%. The left ventricle has normal function. The left ventricle has no regional wall motion abnormalities. Left ventricular diastolic parameters are consistent with Grade I diastolic dysfunction (impaired relaxation).  2. Right ventricular systolic function is normal. The right ventricular size is normal. Tricuspid regurgitation signal is inadequate for assessing PA pressure.  3. The mitral valve is grossly normal. No evidence of mitral valve regurgitation. No evidence of mitral stenosis.  4. The aortic valve was not well visualized. Aortic valve regurgitation is not visualized. No aortic stenosis is present.  5. The inferior vena cava is normal in size with greater than 50% respiratory variability, suggesting right atrial pressure of 3 mmHg.  6. Agitated saline contrast bubble study was negative, with no evidence of any interatrial shunt.  Comparison(s): No significant change from prior study.  FINDINGS  Left Ventricle: Left ventricular ejection fraction, by estimation, is 60 to 65%. The left ventricle has normal function. The left ventricle has no regional wall motion abnormalities. The left ventricular internal cavity size was normal in size. There is  no left ventricular hypertrophy. Left ventricular diastolic parameters are consistent with Grade I  diastolic dysfunction (impaired relaxation).  Right Ventricle: The right ventricular size is normal. No increase in right ventricular wall thickness. Right ventricular systolic function is normal. Tricuspid regurgitation signal is inadequate for assessing PA pressure.  Left Atrium: Left atrial size was normal in size.  Right Atrium: Right atrial size was  normal in size.  Pericardium: There is no evidence of pericardial effusion.  Mitral Valve: The mitral valve is grossly normal. No evidence of mitral valve regurgitation. No evidence of mitral valve stenosis.  Tricuspid Valve: The tricuspid valve is grossly normal. Tricuspid valve regurgitation is not demonstrated. No evidence of tricuspid stenosis.  Aortic Valve: The aortic valve was not well visualized. Aortic valve regurgitation is not visualized. No aortic stenosis is present.  Pulmonic Valve: The pulmonic valve was not well visualized. Pulmonic valve regurgitation is not visualized. No evidence of pulmonic stenosis.  Aorta: The aortic root and ascending aorta are structurally normal, with no evidence of dilitation.  Venous: The inferior vena cava is normal in size with greater than 50% respiratory variability, suggesting right atrial pressure of 3 mmHg.  IAS/Shunts: No atrial level shunt detected by color flow Doppler. Agitated saline contrast was given intravenously to evaluate for intracardiac shunting. Agitated saline contrast bubble study was negative, with no evidence of any interatrial shunt.       EKG:  EKG is *** ordered today.  The ekg ordered today demonstrates ***  Recent Labs: 11/18/2022: Hemoglobin 17.1; Platelets 240 11/19/2022: ALT 42; BUN 16; Creatinine, Ser 1.09; Potassium 2.9; Sodium 134  Recent Lipid Panel    Component Value Date/Time   CHOL 103 11/19/2022 0533   CHOL 113 03/15/2022 1026   TRIG 209 (H) 11/19/2022 0533   HDL 32 (L) 11/19/2022 0533   HDL 35 (L) 03/15/2022 1026   CHOLHDL 3.2 11/19/2022 0533   VLDL 42 (H) 11/19/2022 0533   LDLCALC 29 11/19/2022 0533   LDLCALC 51 03/15/2022 1026    Risk Assessment/Calculations:  {Does this patient have ATRIAL FIBRILLATION?:[id number]}  Home Medications   No outpatient medications have been marked as taking for the 12/06/22 encounter (Appointment) with Elgie Collard, PA-C.     Review of Systems    ***   All other systems reviewed and are otherwise negative except as noted above.  Physical Exam    VS:  There were no vitals taken for this visit. , BMI There is no height or weight on file to calculate BMI.  Wt Readings from Last 3 Encounters:  11/18/22 223 lb (101.2 kg)  10/14/22 223 lb 12.8 oz (101.5 kg)  01/01/22 223 lb (101.2 kg)     GEN: Well nourished, well developed, in no acute distress. HEENT: normal. Neck: Supple, no JVD, carotid bruits, or masses. Cardiac: ***RRR, no murmurs, rubs, or gallops. No clubbing, cyanosis, edema.  ***Radials/PT 2+ and equal bilaterally.  Respiratory:  ***Respirations regular and unlabored, clear to auscultation bilaterally. GI: Soft, nontender, nondistended. MS: No deformity or atrophy. Skin: Warm and dry, no rash. Neuro:  Strength and sensation are intact. Psych: Normal affect.  Assessment & Plan    Hypertension Hyperlipidemia History of CVA  No BP recorded.  {Refresh Note OR Click here to enter BP  :1}***      Disposition: Follow up {follow up:15908} with Fransico Him, MD or APP.  Signed, Elgie Collard, PA-C 12/06/2022, 12:58 PM [facility]

## 2022-12-08 DIAGNOSIS — M1711 Unilateral primary osteoarthritis, right knee: Secondary | ICD-10-CM | POA: Diagnosis not present

## 2022-12-08 DIAGNOSIS — S8991XS Unspecified injury of right lower leg, sequela: Secondary | ICD-10-CM | POA: Diagnosis not present

## 2022-12-08 DIAGNOSIS — M2391 Unspecified internal derangement of right knee: Secondary | ICD-10-CM | POA: Diagnosis not present

## 2022-12-09 ENCOUNTER — Encounter: Payer: Self-pay | Admitting: Physician Assistant

## 2022-12-09 DIAGNOSIS — E7849 Other hyperlipidemia: Secondary | ICD-10-CM | POA: Diagnosis not present

## 2022-12-09 DIAGNOSIS — E876 Hypokalemia: Secondary | ICD-10-CM | POA: Diagnosis not present

## 2022-12-09 DIAGNOSIS — I10 Essential (primary) hypertension: Secondary | ICD-10-CM | POA: Diagnosis not present

## 2022-12-10 DIAGNOSIS — M25569 Pain in unspecified knee: Secondary | ICD-10-CM | POA: Diagnosis not present

## 2022-12-10 DIAGNOSIS — R030 Elevated blood-pressure reading, without diagnosis of hypertension: Secondary | ICD-10-CM | POA: Diagnosis not present

## 2022-12-10 DIAGNOSIS — Z1389 Encounter for screening for other disorder: Secondary | ICD-10-CM | POA: Diagnosis not present

## 2022-12-10 DIAGNOSIS — Z8673 Personal history of transient ischemic attack (TIA), and cerebral infarction without residual deficits: Secondary | ICD-10-CM | POA: Diagnosis not present

## 2022-12-12 DIAGNOSIS — G4733 Obstructive sleep apnea (adult) (pediatric): Secondary | ICD-10-CM | POA: Diagnosis not present

## 2022-12-19 DIAGNOSIS — Z8673 Personal history of transient ischemic attack (TIA), and cerebral infarction without residual deficits: Secondary | ICD-10-CM | POA: Diagnosis not present

## 2022-12-19 DIAGNOSIS — R29898 Other symptoms and signs involving the musculoskeletal system: Secondary | ICD-10-CM | POA: Diagnosis not present

## 2023-01-05 DIAGNOSIS — E782 Mixed hyperlipidemia: Secondary | ICD-10-CM | POA: Diagnosis not present

## 2023-01-10 NOTE — Progress Notes (Deleted)
Office Visit    Patient Name: Austin Bruce Date of Encounter: 01/10/2023  PCP:  Practice, [facility]   [facility]  Cardiologist:  Armanda Magic, MD  Advanced Practice Provider:  No care team member to display Electrophysiologist:  None   HPI    Austin Bruce is a 77 y.o. male with a past medical history of HTN, HLD, TIA in 2017 at [facility] and then s/p acute CVA 03/29/21 presents today for follow-up.   MRI revealed a small acute or subacute white matter infarct in the left frontal lobe with severe chronic small vessel ischemic disease and chronic left thenar infarcts in the cerebrum and cerebellum.  Aspirin was stopped due to history of GI bleed with aspirin.  2D echocardiogram with LVEF 60 to 65%.  Patient had a 30-day monitor placed for CVA.  Only 9 days were analyzed but he was in normal sinus rhythm without evidence of atrial fibrillation.  He also had a sleep study but was not treated because he could not use the machine.  He says that CPAP device kept him awake.  He was seen for follow-up 12/03/2021 and was doing well at that time.  No chest pains or shortness of breath.  No DOE, PND, orthopnea, lower extremity edema, palpitations, or syncope.  He does have chronic vertigo.  Compliant with medications and tolerating medications.  Today, he***  Past Medical History    Past Medical History:  Diagnosis Date   Aphakia, right eye 07/02/2021   Onset post cataract surgery with dislocation of IOL 07/01/2021  Vitrectomy, insertion three-piece IOL sulcus 07/03/2021   Choroidal detachment, serous, right eye 08/12/2021   Late onset post vitrectomy for dislocated IOL, retained lens fragments and IOL repositioning  08/19/2021, resolved choroidal detachments post topical therapy with atropine   High cholesterol    Hypertension    OSA (obstructive sleep apnea)    Intolerant to CPAP   Stroke [facility])    Past Surgical History:  Procedure Laterality Date   CATARACT  EXTRACTION Right 07/01/2021   Dislocated IOL   HAND SURGERY     HERNIA REPAIR     NOSE SURGERY     TONSILLECTOMY      Allergies  Allergies  Allergen Reactions   Penicillins Anaphylaxis    EKGs/Labs/Other Studies Reviewed:   The following studies were reviewed today: Cardiac Studies & Procedures       ECHOCARDIOGRAM  ECHOCARDIOGRAM COMPLETE BUBBLE STUDY 11/19/2022  Narrative ECHOCARDIOGRAM REPORT    Patient Name:   Austin Bruce Date of Exam: 11/19/2022 Medical Rec #:  [id number]    Height:       69.0 in Accession #:    [id number]   Weight:       223.0 lb Date of Birth:  [birth year]    BSA:          2.164 m Patient Age:    76 years     BP:           171/95 mmHg Patient Gender: M            HR:           60 bpm. [facility]  Procedure: 2D Echo, Cardiac Doppler, Color Doppler and Saline Contrast Bubble Study  Indications:    TIA  History:        Patient has prior history of Echocardiogram examinations, most recent 03/29/2021. Stroke, Signs/Symptoms:Dyspnea; Risk Factors:Hypertension, Dyslipidemia and Former Smoker.  Sonographer:    Sheralyn Boatman RDCS Referring Phys: [id number] [id number] KIM  IMPRESSIONS   1. Left ventricular ejection fraction, by estimation, is 60 to 65%. The left ventricle has normal function. The left ventricle has no regional wall motion abnormalities. Left ventricular diastolic parameters are consistent with Grade I diastolic dysfunction (impaired relaxation). 2. Right ventricular systolic function is normal. The right ventricular size is normal. Tricuspid regurgitation signal is inadequate for assessing PA pressure. 3. The mitral valve is grossly normal. No evidence of mitral valve regurgitation. No evidence of mitral stenosis. 4. The aortic valve was not well visualized. Aortic valve regurgitation is not visualized. No aortic stenosis is present. 5. The inferior vena cava is normal in size with greater than 50% respiratory variability,  suggesting right atrial pressure of 3 mmHg. 6. Agitated saline contrast bubble study was negative, with no evidence of any interatrial shunt.  Comparison(s): No significant change from prior study.  FINDINGS Left Ventricle: Left ventricular ejection fraction, by estimation, is 60 to 65%. The left ventricle has normal function. The left ventricle has no regional wall motion abnormalities. The left ventricular internal cavity size was normal in size. There is no left ventricular hypertrophy. Left ventricular diastolic parameters are consistent with Grade I diastolic dysfunction (impaired relaxation).  Right Ventricle: The right ventricular size is normal. No increase in right ventricular wall thickness. Right ventricular systolic function is normal. Tricuspid regurgitation signal is inadequate for assessing PA pressure.  Left Atrium: Left atrial size was normal in size.  Right Atrium: Right atrial size was normal in size.  Pericardium: There is no evidence of pericardial effusion.  Mitral Valve: The mitral valve is grossly normal. No evidence of mitral valve regurgitation. No evidence of mitral valve stenosis.  Tricuspid Valve: The tricuspid valve is grossly normal. Tricuspid valve regurgitation is not demonstrated. No evidence of tricuspid stenosis.  Aortic Valve: The aortic valve was not well visualized. Aortic valve regurgitation is not visualized. No aortic stenosis is present.  Pulmonic Valve: The pulmonic valve was not well visualized. Pulmonic valve regurgitation is not visualized. No evidence of pulmonic stenosis.  Aorta: The aortic root and ascending aorta are structurally normal, with no evidence of dilitation.  Venous: The inferior vena cava is normal in size with greater than 50% respiratory variability, suggesting right atrial pressure of 3 mmHg.  IAS/Shunts: No atrial level shunt detected by color flow Doppler. Agitated saline contrast was given intravenously to evaluate for  intracardiac shunting. Agitated saline contrast bubble study was negative, with no evidence of any interatrial shunt.   LEFT VENTRICLE PLAX 2D LVIDd:         4.40 cm     Diastology LVIDs:         2.70 cm     LV e' medial:    4.68 cm/s LV PW:         0.90 cm     LV E/e' medial:  10.8 LV IVS:        1.10 cm     LV e' lateral:   5.77 cm/s LVOT diam:     2.30 cm     LV E/e' lateral: 8.8 LV SV:         93 LV SV Index:   43 LVOT Area:     4.15 cm  LV Volumes (MOD) LV vol d, MOD A2C: 78.5 ml LV vol d, MOD A4C: 97.0 ml LV vol s, MOD A2C: 28.6 ml LV vol s, MOD A4C: 34.5 ml LV SV  MOD A2C:     50.0 ml LV SV MOD A4C:     97.0 ml LV SV MOD BP:      57.2 ml  RIGHT VENTRICLE             IVC RV S prime:     13.20 cm/s  IVC diam: 1.90 cm TAPSE (M-mode): 1.2 cm  LEFT ATRIUM             Index LA diam:        3.30 cm 1.53 cm/m LA Vol (A2C):   19.4 ml 8.97 ml/m LA Vol (A4C):   35.8 ml 16.54 ml/m LA Biplane Vol: 29.1 ml 13.45 ml/m AORTIC VALVE LVOT Vmax:   113.00 cm/s LVOT Vmean:  73.500 cm/s LVOT VTI:    0.224 m  AORTA Ao Root diam: 3.45 cm Ao Asc diam:  3.00 cm  MITRAL VALVE MV Area (PHT): 2.69 cm    SHUNTS MV Decel Time: 282 msec    Systemic VTI:  0.22 m MV E velocity: 50.60 cm/s  Systemic Diam: 2.30 cm MV A velocity: 94.05 cm/s MV E/A ratio:  0.54  Vishnu Priya Mallipeddi Electronically signed by Winfield Rast Mallipeddi Signature Date/Time: 11/19/2022/12:34:12 PM    Final    MONITORS  CARDIAC EVENT MONITOR 04/19/2021  Narrative [employer] monitor reviewed.  9 days analyzed.  Predominant rhythm is sinus with IVCD, heart rate ranging from 53 bpm up to 101 bpm and average heart rate 64 bpm.  No atrial fibrillation was noted.  There were no pauses.            EKG:  EKG is *** ordered today.  The ekg ordered today demonstrates ***  Recent Labs: 11/18/2022: Hemoglobin 17.1; Platelets 240 11/19/2022: ALT 42; BUN 16; Creatinine, Ser 1.09; Potassium 2.9; Sodium 134   Recent Lipid Panel    Component Value Date/Time   CHOL 103 11/19/2022 0533   CHOL 113 03/15/2022 1026   TRIG 209 (H) 11/19/2022 0533   HDL 32 (L) 11/19/2022 0533   HDL 35 (L) 03/15/2022 1026   CHOLHDL 3.2 11/19/2022 0533   VLDL 42 (H) 11/19/2022 0533   LDLCALC 29 11/19/2022 0533   LDLCALC 51 03/15/2022 1026    Risk Assessment/Calculations:  {Does this patient have ATRIAL FIBRILLATION?[phone]}  Home Medications   No outpatient medications have been marked as taking for the 01/11/23 encounter (Appointment) with Sharlene Dory, PA-C.     Review of Systems   ***   All other systems reviewed and are otherwise negative except as noted above.  Physical Exam    VS:  There were no vitals taken for this visit. , BMI There is no height or weight on file to calculate BMI.  Wt Readings from Last 3 Encounters:  11/18/22 223 lb (101.2 kg)  10/14/22 223 lb 12.8 oz (101.5 kg)  01/01/22 223 lb (101.2 kg)     GEN: Well nourished, well developed, in no acute distress. HEENT: normal. Neck: Supple, no JVD, carotid bruits, or masses. Cardiac: ***RRR, no murmurs, rubs, or gallops. No clubbing, cyanosis, edema.  ***Radials/PT 2+ and equal bilaterally.  Respiratory:  ***Respirations regular and unlabored, clear to auscultation bilaterally. GI: Soft, nontender, nondistended. MS: No deformity or atrophy. Skin: Warm and dry, no rash. Neuro:  Strength and sensation are intact. Psych: Normal affect.  Assessment & Plan    OSA CVA 03/2021 Hypertension Hyperlipidemia  No BP recorded.  {Refresh Note OR Click here to enter BP  :1}***  Disposition: Follow up {follow up:15908} with Armanda Magic, MD or APP.  Signed, Sharlene Dory, PA-C 01/10/2023, 12:44 PM [facility]

## 2023-01-11 ENCOUNTER — Ambulatory Visit: Payer: Medicare Other | Admitting: Physician Assistant

## 2023-01-11 DIAGNOSIS — E785 Hyperlipidemia, unspecified: Secondary | ICD-10-CM

## 2023-01-11 DIAGNOSIS — G4733 Obstructive sleep apnea (adult) (pediatric): Secondary | ICD-10-CM

## 2023-01-11 DIAGNOSIS — I10 Essential (primary) hypertension: Secondary | ICD-10-CM

## 2023-01-11 DIAGNOSIS — I639 Cerebral infarction, unspecified: Secondary | ICD-10-CM

## 2023-01-12 DIAGNOSIS — I10 Essential (primary) hypertension: Secondary | ICD-10-CM | POA: Diagnosis not present

## 2023-01-12 DIAGNOSIS — Z8673 Personal history of transient ischemic attack (TIA), and cerebral infarction without residual deficits: Secondary | ICD-10-CM | POA: Diagnosis not present

## 2023-01-12 DIAGNOSIS — E039 Hypothyroidism, unspecified: Secondary | ICD-10-CM | POA: Diagnosis not present

## 2023-01-12 DIAGNOSIS — M25569 Pain in unspecified knee: Secondary | ICD-10-CM | POA: Diagnosis not present

## 2023-01-13 DIAGNOSIS — H5202 Hypermetropia, left eye: Secondary | ICD-10-CM | POA: Diagnosis not present

## 2023-01-13 DIAGNOSIS — H25812 Combined forms of age-related cataract, left eye: Secondary | ICD-10-CM | POA: Diagnosis not present

## 2023-01-13 DIAGNOSIS — H5211 Myopia, right eye: Secondary | ICD-10-CM | POA: Diagnosis not present

## 2023-01-13 DIAGNOSIS — Z961 Presence of intraocular lens: Secondary | ICD-10-CM | POA: Diagnosis not present

## 2023-01-13 DIAGNOSIS — H524 Presbyopia: Secondary | ICD-10-CM | POA: Diagnosis not present

## 2023-01-19 DIAGNOSIS — M25569 Pain in unspecified knee: Secondary | ICD-10-CM | POA: Diagnosis not present

## 2023-01-19 DIAGNOSIS — I10 Essential (primary) hypertension: Secondary | ICD-10-CM | POA: Diagnosis not present

## 2023-01-19 DIAGNOSIS — E039 Hypothyroidism, unspecified: Secondary | ICD-10-CM | POA: Diagnosis not present

## 2023-01-19 DIAGNOSIS — Z8673 Personal history of transient ischemic attack (TIA), and cerebral infarction without residual deficits: Secondary | ICD-10-CM | POA: Diagnosis not present

## 2023-01-19 NOTE — Progress Notes (Deleted)
Office Visit    Patient Name: Austin Bruce Date of Encounter: 01/19/2023  Primary Care Provider:  [facility] Primary Cardiologist:  Armanda Magic, MD Primary Electrophysiologist: None   Past Medical History    Past Medical History:  Diagnosis Date   Aphakia, right eye 07/02/2021   Onset post cataract surgery with dislocation of IOL 07/01/2021  Vitrectomy, insertion three-piece IOL sulcus 07/03/2021   Choroidal detachment, serous, right eye 08/12/2021   Late onset post vitrectomy for dislocated IOL, retained lens fragments and IOL repositioning  08/19/2021, resolved choroidal detachments post topical therapy with atropine   High cholesterol    Hypertension    OSA (obstructive sleep apnea)    Intolerant to CPAP   Stroke [facility])    Past Surgical History:  Procedure Laterality Date   CATARACT EXTRACTION Right 07/01/2021   Dislocated IOL   HAND SURGERY     HERNIA REPAIR     NOSE SURGERY     TONSILLECTOMY      Allergies  Allergies  Allergen Reactions   Penicillins Anaphylaxis     History of Present Illness    Austin Bruce  is a 77 year old male with a PMH of 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  Austin Bruce was initially seen in 2022 after he suffered an acute frontal lobe CVA.  He was loaded with ASA and Plavix and declined tPA at that time.  ASA had to be stopped due to history of GI bleeding.  He underwent a 2D echo that showed EF of 60 to 65% and was placed with a 30-day event monitor that only analyzed 9 days that were all sinus rhythm without AF.  He was evaluated for obstructive sleep apnea and was found to be intolerant to CPAP.  He was seen in 11/18/2022 in the ED with complaint of right-sided weakness and numbness with associated blurry vision and dizziness.  Code stroke was activated and CT of the brain and MRI showed no evidence of acute  infarct but you noted severe chronic small vessel ischemic disease.  Patient had 2D echo completed with EF of 60 to 65% with no RWMA and grade 1 DD with normal RV systolic function and normal valvular function.  He was discharged with ASA 81 mg and Plavix x 3 weeks and then Plavix alone.  He was discharged also with a 14-day ZIO monitor.   Since last being seen in the office patient reports***.  Patient denies chest pain, palpitations, dyspnea, PND, orthopnea, nausea, vomiting, dizziness, syncope, edema, weight gain, or early satiety.     ***Notes:  Home Medications    Current Outpatient Medications  Medication Sig Dispense Refill   ABRYSVO 120 MCG/0.5ML injection      amLODipine (NORVASC) 10 MG tablet Take 10 mg by mouth daily.     benzonatate (TESSALON) 100 MG capsule Take 100 mg by mouth 3 (three) times daily as needed.     clopidogrel (PLAVIX) 75 MG tablet Take 75 mg by mouth daily.     indapamide (LOZOL) 2.5 MG tablet Take 2.5 mg by mouth daily.     lisinopril (ZESTRIL) 40 MG tablet Take 40 mg by mouth daily.     loratadine (CLARITIN) 10 MG tablet Take 10 mg by mouth daily.     meclizine (ANTIVERT) 12.5 MG tablet Take 12.5 mg by mouth 3 (three) times daily  as needed for nausea.     Omega-3 Fatty Acids (FISH OIL) 1200 MG CAPS Take 1 capsule by mouth daily.     OXYGEN Inhale 2 L into the lungs every evening.     potassium chloride SA (KLOR-CON M) 20 MEQ tablet Take 20 mEq by mouth 2 (two) times daily.     RESTASIS 0.05 % ophthalmic emulsion Apply to eye.     rosuvastatin (CRESTOR) 20 MG tablet Take 1 tablet (20 mg total) by mouth daily. 30 tablet 5   tamsulosin (FLOMAX) 0.4 MG CAPS capsule Take 0.4 mg by mouth daily.     No current facility-administered medications for this visit.     Review of Systems  Please see the history of present illness.    (+)*** (+)***  All other systems reviewed and are otherwise negative except as noted above.  Physical Exam    Wt Readings from  Last 3 Encounters:  11/18/22 223 lb (101.2 kg)  10/14/22 223 lb 12.8 oz (101.5 kg)  01/01/22 223 lb (101.2 kg)   UJ:WJXBJ were no vitals filed for this visit.,There is no height or weight on file to calculate BMI.  Constitutional:      Appearance: Healthy appearance. Not in distress.  Neck:     Vascular: JVD normal.  Pulmonary:     Effort: Pulmonary effort is normal.     Breath sounds: No wheezing. No rales. Diminished in the bases Cardiovascular:     Normal rate. Regular rhythm. Normal S1. Normal S2.      Murmurs: There is no murmur.  Edema:    Peripheral edema absent.  Abdominal:     Palpations: Abdomen is soft non tender. There is no hepatomegaly.  Skin:    General: Skin is warm and dry.  Neurological:     General: No focal deficit present.     Mental Status: Alert and oriented to person, place and time.     Cranial Nerves: Cranial nerves are intact.  EKG/LABS/ Recent Cardiac Studies    ECG personally reviewed by me today - ***  Cardiac Studies & Procedures       ECHOCARDIOGRAM  ECHOCARDIOGRAM COMPLETE BUBBLE STUDY 11/19/2022  Narrative ECHOCARDIOGRAM REPORT    Patient Name:   Austin Bruce Date of Exam: 11/19/2022 Medical Rec #:  [id number]    Height:       69.0 in Accession #:    [id number]   Weight:       223.0 lb Date of Birth:  1946/01/29    BSA:          2.164 m Patient Age:    76 years     BP:           171/95 mmHg Patient Gender: M            HR:           60 bpm. [facility]  Procedure: 2D Echo, Cardiac Doppler, Color Doppler and Saline Contrast Bubble Study  Indications:    TIA  History:        Patient has prior history of Echocardiogram examinations, most recent 03/29/2021. Stroke, Signs/Symptoms:Dyspnea; Risk Factors:Hypertension, Dyslipidemia and Former Smoker.  Sonographer:    Sheralyn Boatman RDCS Referring Phys: [id number] [id number] KIM  IMPRESSIONS   1. Left ventricular ejection fraction, by estimation, is 60 to 65%. The left ventricle  has normal function. The left ventricle has no regional wall motion abnormalities. Left ventricular diastolic parameters are consistent with Grade  I diastolic dysfunction (impaired relaxation). 2. Right ventricular systolic function is normal. The right ventricular size is normal. Tricuspid regurgitation signal is inadequate for assessing PA pressure. 3. The mitral valve is grossly normal. No evidence of mitral valve regurgitation. No evidence of mitral stenosis. 4. The aortic valve was not well visualized. Aortic valve regurgitation is not visualized. No aortic stenosis is present. 5. The inferior vena cava is normal in size with greater than 50% respiratory variability, suggesting right atrial pressure of 3 mmHg. 6. Agitated saline contrast bubble study was negative, with no evidence of any interatrial shunt.  Comparison(s): No significant change from prior study.  FINDINGS Left Ventricle: Left ventricular ejection fraction, by estimation, is 60 to 65%. The left ventricle has normal function. The left ventricle has no regional wall motion abnormalities. The left ventricular internal cavity size was normal in size. There is no left ventricular hypertrophy. Left ventricular diastolic parameters are consistent with Grade I diastolic dysfunction (impaired relaxation).  Right Ventricle: The right ventricular size is normal. No increase in right ventricular wall thickness. Right ventricular systolic function is normal. Tricuspid regurgitation signal is inadequate for assessing PA pressure.  Left Atrium: Left atrial size was normal in size.  Right Atrium: Right atrial size was normal in size.  Pericardium: There is no evidence of pericardial effusion.  Mitral Valve: The mitral valve is grossly normal. No evidence of mitral valve regurgitation. No evidence of mitral valve stenosis.  Tricuspid Valve: The tricuspid valve is grossly normal. Tricuspid valve regurgitation is not demonstrated. No  evidence of tricuspid stenosis.  Aortic Valve: The aortic valve was not well visualized. Aortic valve regurgitation is not visualized. No aortic stenosis is present.  Pulmonic Valve: The pulmonic valve was not well visualized. Pulmonic valve regurgitation is not visualized. No evidence of pulmonic stenosis.  Aorta: The aortic root and ascending aorta are structurally normal, with no evidence of dilitation.  Venous: The inferior vena cava is normal in size with greater than 50% respiratory variability, suggesting right atrial pressure of 3 mmHg.  IAS/Shunts: No atrial level shunt detected by color flow Doppler. Agitated saline contrast was given intravenously to evaluate for intracardiac shunting. Agitated saline contrast bubble study was negative, with no evidence of any interatrial shunt.   LEFT VENTRICLE PLAX 2D LVIDd:         4.40 cm     Diastology LVIDs:         2.70 cm     LV e' medial:    4.68 cm/s LV PW:         0.90 cm     LV E/e' medial:  10.8 LV IVS:        1.10 cm     LV e' lateral:   5.77 cm/s LVOT diam:     2.30 cm     LV E/e' lateral: 8.8 LV SV:         93 LV SV Index:   43 LVOT Area:     4.15 cm  LV Volumes (MOD) LV vol d, MOD A2C: 78.5 ml LV vol d, MOD A4C: 97.0 ml LV vol s, MOD A2C: 28.6 ml LV vol s, MOD A4C: 34.5 ml LV SV MOD A2C:     50.0 ml LV SV MOD A4C:     97.0 ml LV SV MOD BP:      57.2 ml  RIGHT VENTRICLE             IVC RV S prime:  13.20 cm/s  IVC diam: 1.90 cm TAPSE (M-mode): 1.2 cm  LEFT ATRIUM             Index LA diam:        3.30 cm 1.53 cm/m LA Vol (A2C):   19.4 ml 8.97 ml/m LA Vol (A4C):   35.8 ml 16.54 ml/m LA Biplane Vol: 29.1 ml 13.45 ml/m AORTIC VALVE LVOT Vmax:   113.00 cm/s LVOT Vmean:  73.500 cm/s LVOT VTI:    0.224 m  AORTA Ao Root diam: 3.45 cm Ao Asc diam:  3.00 cm  MITRAL VALVE MV Area (PHT): 2.69 cm    SHUNTS MV Decel Time: 282 msec    Systemic VTI:  0.22 m MV E velocity: 50.60 cm/s  Systemic Diam: 2.30  cm MV A velocity: 94.05 cm/s MV E/A ratio:  0.54  Vishnu Priya Mallipeddi Electronically signed by Winfield Rast Mallipeddi Signature Date/Time: 11/19/2022/12:34:12 PM    Final    MONITORS  CARDIAC EVENT MONITOR 04/19/2021  Narrative [employer] monitor reviewed.  9 days analyzed.  Predominant rhythm is sinus with IVCD, heart rate ranging from 53 bpm up to 101 bpm and average heart rate 64 bpm.  No atrial fibrillation was noted.  There were no pauses.           Risk Assessment/Calculations:   {Does this patient have ATRIAL FIBRILLATION?:[id number]}        Lab Results  Component Value Date   WBC 10.7 (H) 11/18/2022   HGB 17.1 (H) 11/18/2022   HCT 46.6 11/18/2022   MCV 88.9 11/18/2022   PLT 240 11/18/2022   Lab Results  Component Value Date   CREATININE 1.09 11/19/2022   BUN 16 11/19/2022   NA 134 (L) 11/19/2022   K 2.9 (L) 11/19/2022   CL 99 11/19/2022   CO2 25 11/19/2022   Lab Results  Component Value Date   ALT 42 11/19/2022   AST 27 11/19/2022   ALKPHOS 50 11/19/2022   BILITOT 1.2 11/19/2022   Lab Results  Component Value Date   CHOL 103 11/19/2022   HDL 32 (L) 11/19/2022   LDLCALC 29 11/19/2022   TRIG 209 (H) 11/19/2022   CHOLHDL 3.2 11/19/2022    Lab Results  Component Value Date   HGBA1C 5.9 (H) 11/19/2022     Assessment & Plan    1.  History of CVA: -s/p frontal lobe CVA 03/2021 with no residual and recent ED admission with RLE weakness and no acute changes noted on MRI. -Patient was discharged with ASA 81 mg and Plavix 75 mg x 14 days and then Plavix alone. -Today patient reports***  2.  Essential hypertension: -Patient's blood pressure today was*** -Continue***  3.  Hyperlipidemia: -Patient's last LDL cholesterol was*** -Continue***  4.  Obstructive sleep apnea: -Patient intolerant to CPAP  5.  Hypokalemia: -Potassium on 3/15 was 2.9 and patient currently on potassium 20 mEq twice daily     Disposition: Follow-up with Armanda Magic, MD or APP in *** months {Are you ordering a CV Procedure (e.g. stress test, cath, DCCV, TEE, etc)?   Press F2        :[id number]}   Medication Adjustments/Labs and Tests Ordered: Current medicines are reviewed at length with the patient today.  Concerns regarding medicines are outlined above.   Signed, Napoleon Form, Leodis Rains, NP 01/19/2023, 3:59 PM [facility]

## 2023-01-21 ENCOUNTER — Ambulatory Visit: Payer: Medicare Other | Admitting: Nurse Practitioner

## 2023-01-21 DIAGNOSIS — E876 Hypokalemia: Secondary | ICD-10-CM

## 2023-01-21 DIAGNOSIS — R29898 Other symptoms and signs involving the musculoskeletal system: Secondary | ICD-10-CM

## 2023-01-21 DIAGNOSIS — I10 Essential (primary) hypertension: Secondary | ICD-10-CM

## 2023-01-21 DIAGNOSIS — Z8673 Personal history of transient ischemic attack (TIA), and cerebral infarction without residual deficits: Secondary | ICD-10-CM

## 2023-01-21 DIAGNOSIS — E785 Hyperlipidemia, unspecified: Secondary | ICD-10-CM

## 2023-02-04 DIAGNOSIS — I639 Cerebral infarction, unspecified: Secondary | ICD-10-CM | POA: Diagnosis not present

## 2023-02-04 DIAGNOSIS — E782 Mixed hyperlipidemia: Secondary | ICD-10-CM | POA: Diagnosis not present

## 2023-02-04 DIAGNOSIS — I10 Essential (primary) hypertension: Secondary | ICD-10-CM | POA: Diagnosis not present

## 2023-02-06 NOTE — Progress Notes (Unsigned)
Cardiology Office Note:    Date:  02/07/2023   ID:  Austin Bruce, DOB 05/03/1946, MRN [id number]  PCP:  [facility]   [facility] Providers Cardiologist:  Armanda Magic, MD     Referring MD: [facility]   Chief Complaint: hypertension, history of CVA  History of Present Illness:    Austin Bruce is a very pleasant 78 y.o. male with a hx of HTN, HLD, TIA in 2017 at [facility] and then acute CVA 03/29/2021, OSA, GIB, hypokalemia, prediatbetes (A1C 6.1% on 03/28/21), former tobacco abuse, and vertigo.   CVA 03/29/21. MRI revealed small acute or subacute white matter infarct in the left frontal lobe with severe chronic small vessel ischemic disease and chronic lacunar infarcts in the cerebrum and cerebellum.  Aspirin was stopped due to history of GI bleed while on aspirin.  2D echo revealed normal LVEF 60 to 65%.  He had 30-day monitor placed for CVA.  Only 9 days were analyzed but he was in normal sinus rhythm without evidence of atrial fibrillation.  Previously diagnosed with sleep apnea but not treated because he could not use the machine. He says CPAP device kept him awake. Followed by [facility] for hx CVA.   Seen in clinic 12/03/21 by Dr. Mayford Knife. It was recommended that he undergo sleep study  Seen by Margaretmary Dys, RPH on 01/19/2022 for management of cholesterol.  It was discovered that he was taking clopidogrel 75 mg daily for stroke prevention but this was not on his med list.  He was taking simvastatin for high cholesterol.  Additionally, patient is on amlodipine which may increase serum concentration of simvastatin and should be limited to 20 mg daily in combination with amlodipine.  Simvastatin was stopped and rosuvastatin 20 mg was started.  Vascepa 2 g twice daily with meals was added to lower TG and CV risk.  He was encouraged to increase exercise and work on heart healthy, mostly plant based diet.   Sleep study 02/15/22 which revealed no  significant sleep apnea but did show nocturnal hypoxemia.  Orders placed for overnight O2 at 2L [place].   Admission 3/14-3/15/24 for dizziness, palpitations, RLE weakness, and R sensory deficit.  He reported feeling dizzy and vision became blurred "feeling like things are moving when they are not."  Upon arrival to ED he had RLE drift and sensory deficit to right face/arm/leg prompting stroke code to be activated.  Symptoms were mild and improving and TNK was not administered.  No acute process on head CT, EKG revealed NSR. MRI brain revealed no acute CVA.  Hypokalemia was treated with supplemental potassium.  Advised to take Plavix and aspirin for 21 days followed by Plavix alone.  Advised to have 14-day cardiac monitor and echocardiogram. Echo bubble study 11/19/2022 was negative with no evidence of interatrial shunt LVEF 65%, G1 DD, normal RV size and function, no RWMA, no significant valve disease.  Today, he is here with his wife for follow-up.  Reports he is feeling well.  Reports no further episodes or symptoms concerning for TIA since March hospitalization. Has not been monitoring BP consistently. Enjoys staying active around home doing mostly yard work.  His children do not want him working outside.  Says if he gets tired he stops, no concerning symptoms. Never received cardiac monitor that was reportedly mailed to him in March. Wears oxygen at night while sleeping, no concerns. He denies chest pain, shortness of breath, lower extremity edema, fatigue, palpitations, melena,  hematuria, hemoptysis, diaphoresis, weakness, presyncope, syncope, orthopnea, and PND. Wife states they have improved their diet with mostly baked and steamed food, however they admit they still eat fried chicken, hot dogs and bologna regularly.   Past Medical History:  Diagnosis Date   Aphakia, right eye 07/02/2021   Onset post cataract surgery with dislocation of IOL 07/01/2021  Vitrectomy, insertion three-piece IOL sulcus  07/03/2021   Choroidal detachment, serous, right eye 08/12/2021   Late onset post vitrectomy for dislocated IOL, retained lens fragments and IOL repositioning  08/19/2021, resolved choroidal detachments post topical therapy with atropine   High cholesterol    Hypertension    OSA (obstructive sleep apnea)    Intolerant to CPAP   Stroke [facility])     Past Surgical History:  Procedure Laterality Date   CATARACT EXTRACTION Right 07/01/2021   Dislocated IOL   HAND SURGERY     HERNIA REPAIR     NOSE SURGERY     TONSILLECTOMY      Current Medications: Current Meds  Medication Sig   amLODipine (NORVASC) 10 MG tablet Take 10 mg by mouth daily.   clopidogrel (PLAVIX) 75 MG tablet Take 75 mg by mouth daily.   indapamide (LOZOL) 2.5 MG tablet Take 2.5 mg by mouth daily.   levothyroxine (SYNTHROID) 25 MCG tablet Take 25 mcg by mouth daily.   lisinopril (ZESTRIL) 40 MG tablet Take 40 mg by mouth daily.   loratadine (CLARITIN) 10 MG tablet Take 10 mg by mouth daily.   meclizine (ANTIVERT) 12.5 MG tablet Take 12.5 mg by mouth 3 (three) times daily as needed for nausea.   Omega-3 Fatty Acids (FISH OIL) 1200 MG CAPS Take 1 capsule by mouth daily.   OXYGEN Inhale 2 L into the lungs every evening.   Potassium Chloride ER 20 MEQ TBCR Take by mouth.  Take 40 mEq by mouth   RESTASIS 0.05 % ophthalmic emulsion Apply to eye.   rosuvastatin (CRESTOR) 20 MG tablet Take 1 tablet (20 mg total) by mouth daily.   tamsulosin (FLOMAX) 0.4 MG CAPS capsule Take 0.4 mg by mouth daily.     Allergies:   Penicillins   Social History   Socioeconomic History   Marital status: Married    Spouse name: Not on file   Number of children: Not on file   Years of education: Not on file   Highest education level: Not on file  Occupational History   Not on file  Tobacco Use   Smoking status: Former    Packs/day: 3    Types: Cigarettes    Start date: 09/06/1956    Quit date: 09/07/1987    Years since quitting: 35.4    Smokeless tobacco: Never   Tobacco comments:    Pt reports that at the most he smoked 3ppd  Vaping Use   Vaping Use: Never used  Substance and Sexual Activity   Alcohol use: Not Currently   Drug use: Never   Sexual activity: Not on file  Other Topics Concern   Not on file  Social History Narrative   Not on file   Social Determinants of Health   Financial Resource Strain: Not on file  Food Insecurity: No Food Insecurity (11/19/2022)   Hunger Vital Sign    Worried About Running Out of Food in the Last Year: Never true    Ran Out of Food in the Last Year: Never true  Transportation Needs: No Transportation Needs (11/19/2022)   PRAPARE - Transportation  Lack of Transportation (Medical): No    Lack of Transportation (Non-Medical): No  Physical Activity: Not on file  Stress: Not on file  Social Connections: Not on file     Family History: The patient's family history is not on file.  ROS:   Please see the history of present illness.   All other systems reviewed and are negative.  Labs/Other Studies Reviewed:    The following studies were reviewed today:  Echo 11/19/22 1. Left ventricular ejection fraction, by estimation, is 60 to 65%. The  left ventricle has normal function. The left ventricle has no regional  wall motion abnormalities. Left ventricular diastolic parameters are  consistent with Grade I diastolic  dysfunction (impaired relaxation).   2. Right ventricular systolic function is normal. The right ventricular  size is normal. Tricuspid regurgitation signal is inadequate for assessing  PA pressure.   3. The mitral valve is grossly normal. No evidence of mitral valve  regurgitation. No evidence of mitral stenosis.   4. The aortic valve was not well visualized. Aortic valve regurgitation  is not visualized. No aortic stenosis is present.   5. The inferior vena cava is normal in size with greater than 50%  respiratory variability, suggesting right atrial pressure  of 3 mmHg.   6. Agitated saline contrast bubble study was negative, with no evidence  of any interatrial shunt.   Comparison(s): No significant change from prior study.  Recent Labs: 11/18/2022: Hemoglobin 17.1; Platelets 240 11/19/2022: ALT 42; BUN 16; Creatinine, Ser 1.09; Potassium 2.9; Sodium 134  Recent Lipid Panel    Component Value Date/Time   CHOL 103 11/19/2022 0533   CHOL 113 03/15/2022 1026   TRIG 209 (H) 11/19/2022 0533   HDL 32 (L) 11/19/2022 0533   HDL 35 (L) 03/15/2022 1026   CHOLHDL 3.2 11/19/2022 0533   VLDL 42 (H) 11/19/2022 0533   LDLCALC 29 11/19/2022 0533   LDLCALC 51 03/15/2022 1026     Risk Assessment/Calculations:           Physical Exam:    VS:  BP 124/70   Pulse 84   Ht 5\' 9"  (1.753 m)   Wt 217 lb 6.4 oz (98.6 kg)   SpO2 97%   BMI 32.10 kg/m     Wt Readings from Last 3 Encounters:  02/07/23 217 lb 6.4 oz (98.6 kg)  11/18/22 223 lb (101.2 kg)  10/14/22 223 lb 12.8 oz (101.5 kg)     GEN:  Well nourished, well developed in no acute distress HEENT: Normal NECK: No JVD; No carotid bruits CARDIAC: RRR, no murmurs, rubs, gallops RESPIRATORY:  Clear to auscultation without rales, wheezing or rhonchi  ABDOMEN: Soft, non-tender, non-distended MUSCULOSKELETAL:  No edema; No deformity. 2+ pedal pulses, equal bilaterally SKIN: Warm and dry NEUROLOGIC:  Alert and oriented x 3 PSYCHIATRIC:  Normal affect   EKG:  EKG is not ordered today.        Diagnoses:    1. History of CVA (cerebrovascular accident)   2. Nocturnal hypoxia   3. Hyperlipidemia LDL goal <70   4. Primary hypertension   5. Hypertriglyceridemia    Assessment and Plan:     History of CVA: Reviewed findings from echocardiogram and CTA head/neck that did not reveal any clear reason for TIA/CVA. Cardiac monitor was ordered but not completed. We will reorder 14-day Zio for evaluation of arrhythmia. He denies chest pain or palpitations. No bleeding concerns on Plavix. Has  upcoming appointment with neurology.   Hyperlipidemia/hypertriglyceridemia:  LDL 29, triglycerides 209 on 11/19/2022. Continue rosuvastatin. Was previously on Vascepa but it was cost prohibitive, taking OTC fish oil. Encouraged more plant based eating and regular physical activity.   Noctural hypoxemia: Sleep study 02/2022 revealed nocturnal hypoxemia. Wearing O2 during sleep. No new concerns.   Hypertension: BP is well controlled.  He does not monitor on a consistent basis. Encouraged him to monitor to routinely and report if BP consistently > 140 or > 80. No medication changes today.     Disposition: 6 months with Dr. Mayford Knife or APP  Medication Adjustments/Labs and Tests Ordered: Current medicines are reviewed at length with the patient today.  Concerns regarding medicines are outlined above.  No orders of the defined types were placed in this encounter.  No orders of the defined types were placed in this encounter.   Patient Instructions  Medication Instructions:   Your physician recommends that you continue on your current medications as directed. Please refer to the Current Medication list given to you today.   *If you need a refill on your cardiac medications before your next appointment, please call your pharmacy*   Lab Work:  None ordered.  If you have labs (blood work) drawn today and your tests are completely normal, you will receive your results only by: MyChart Message (if you have MyChart) OR A paper copy in the mail If you have any lab test that is abnormal or we need to change your treatment, we will call you to review the results.   Testing/Procedures:  Christena Deem- Long Term Monitor Instructions  Your physician has requested you wear a ZIO patch monitor for 14 days.  This is a single patch monitor. [employer] supplies one patch monitor per enrollment. Additional stickers are not available. Please do not apply patch if you will be having a Nuclear Stress Test,   Echocardiogram, Cardiac CT, MRI, or Chest Xray during the period you would be wearing the  monitor. The patch cannot be worn during these tests. You cannot remove and re-apply the  ZIO XT patch monitor.  Your ZIO patch monitor will be mailed 3 day USPS to your address on file. It may take 3-5 days  to receive your monitor after you have been enrolled.  Once you have received your monitor, please review the enclosed instructions. Your monitor  has already been registered assigning a specific monitor serial # to you.  Billing and Patient Assistance Program Information  We have supplied [employer] with any of your insurance information on file for billing purposes. [employer] offers a sliding scale Patient Assistance Program for patients that do not have  insurance, or whose insurance does not completely cover the cost of the ZIO monitor.  You must apply for the Patient Assistance Program to qualify for this discounted rate.  To apply, please call [employer] at [phone], select option 4, select option 2, ask to apply for  Patient Assistance Program. [name] will ask your household income, and how many people  are in your household. They will quote your out-of-pocket cost based on that information.  [employer] will also be able to set up a 52-month, interest-free payment plan if needed.  Applying the monitor   Shave hair from upper left chest.  Hold abrader disc by orange tab. Rub abrader in 40 strokes over the upper left chest as  indicated in your monitor instructions.  Clean area with 4 enclosed alcohol pads. Let dry.  Apply patch as indicated in monitor instructions. Patch will be placed  under collarbone on left  side of chest with arrow pointing upward.  Rub patch adhesive wings for 2 minutes. Remove white label marked "1". Remove the white  label marked "2". Rub patch adhesive wings for 2 additional minutes.  While looking in a mirror, press and release button in center of patch. A small  green light will  flash 3-4 times. This will be your only indicator that the monitor has been turned on.  Do not shower for the first 24 hours. You may shower after the first 24 hours.  Press the button if you feel a symptom. You will hear a small click. Record Date, Time and  Symptom in the Patient Logbook.  When you are ready to remove the patch, follow instructions on the last 2 pages of Patient  Logbook. Stick patch monitor onto the last page of Patient Logbook.  Place Patient Logbook in the blue and white box. Use locking tab on box and tape box closed  securely. The blue and white box has prepaid postage on it. Please place it in the mailbox as  soon as possible. Your physician should have your test results approximately 7 days after the  monitor has been mailed back to [facility]  Call [facility] Customer Care at [phone] if you have questions regarding  your ZIO XT patch monitor. Call them immediately if you see an orange light blinking on your  monitor.  If your monitor falls off in less than 4 days, contact our Monitor department at [phone].  If your monitor becomes loose or falls off after 4 days call [employer] at [phone] for  suggestions on securing your monitor    Follow-Up: At [facility], you and your health needs are our priority.  As part of our continuing mission to provide you with exceptional heart care, we have created designated Provider Care Teams.  These Care Teams include your primary Cardiologist (physician) and Advanced Practice Providers (APPs -  Physician Assistants and Nurse Practitioners) who all work together to provide you with the care you need, when you need it.  We recommend signing up for the patient portal called "MyChart".  Sign up information is provided on this After Visit Summary.  MyChart is used to connect with patients for Virtual Visits (Telemedicine).  Patients are able to view lab/test results, encounter notes,  upcoming appointments, etc.  Non-urgent messages can be sent to your provider as well.   To learn more about what you can do with MyChart, go to [email or web address].    Your next appointment:   6 month(s)  Provider:   Armanda Magic, MD     Other Instructions  HOW TO TAKE YOUR BLOOD PRESSURE  Rest 5 minutes before taking your blood pressure. Teejay't  smoke or drink caffeinated beverages for at least 30 minutes before. Take your blood pressure before (not after) you eat. Sit comfortably with your back supported and both feet on the floor ( Heather't cross your legs). Elevate your arm to heart level on a table or a desk. Use the proper sized cuff.  It should fit smoothly and snugly around your bare upper arm.  There should be  Enough room to slip a fingertip under the cuff.  The bottom edge of the cuff should be 1 inch above the crease Of the elbow. Please monitor your blood pressure once daily 2 hours after your am medication. If you blood pressure Consistently remains above 140 (systolic) top number or over 80 ( diastolic) bottom  number X 3 days  Consecutively.  Please call our office at [phone] or send Mychart message.     ----Avoid cold medicines with D or DM at the end of them----  Mediterranean Diet A Mediterranean diet refers to food and lifestyle choices that are based on the traditions of countries located on the [employer]. It focuses on eating more fruits, vegetables, whole grains, beans, nuts, seeds, and heart-healthy fats, and eating less dairy, meat, eggs, and processed foods with added sugar, salt, and fat. This way of eating has been shown to help prevent certain conditions and improve outcomes for people who have chronic diseases, like kidney disease and heart disease. What are tips for following this plan? Reading food labels Check the serving size of packaged foods. For foods such as rice and pasta, the serving size refers to the amount of cooked product, not  dry. Check the total fat in packaged foods. Avoid foods that have saturated fat or trans fats. Check the ingredient list for added sugars, such as corn syrup. Shopping  Buy a variety of foods that offer a balanced diet, including: Fresh fruits and vegetables (produce). Grains, beans, nuts, and seeds. Some of these may be available in unpackaged forms or large amounts (in bulk). Fresh seafood. Poultry and eggs. Low-fat dairy products. Buy whole ingredients instead of prepackaged foods. Buy fresh fruits and vegetables in-season from local farmers markets. Buy plain frozen fruits and vegetables. If you do not have access to quality fresh seafood, buy precooked frozen shrimp or canned fish, such as tuna, salmon, or sardines. Stock your pantry so you always have certain foods on hand, such as olive oil, canned tuna, canned tomatoes, rice, pasta, and beans. Cooking Cook foods with extra-virgin olive oil instead of using butter or other vegetable oils. Have meat as a side dish, and have vegetables or grains as your main dish. This means having meat in small portions or adding small amounts of meat to foods like pasta or stew. Use beans or vegetables instead of meat in common dishes like chili or lasagna. Experiment with different cooking methods. Try roasting, broiling, steaming, and sauting vegetables. Add frozen vegetables to soups, stews, pasta, or rice. Add nuts or seeds for added healthy fats and plant protein at each meal. You can add these to yogurt, salads, or vegetable dishes. Marinate fish or vegetables using olive oil, lemon juice, garlic, and fresh herbs. Meal planning Plan to eat one vegetarian meal one day each week. Try to work up to two vegetarian meals, if possible. Eat seafood two or more times a week. Have healthy snacks readily available, such as: Vegetable sticks with hummus. Greek yogurt. Fruit and nut trail mix. Eat balanced meals throughout the week. This  includes: Fruit: 2-3 servings a day. Vegetables: 4-5 servings a day. Low-fat dairy: 2 servings a day. Fish, poultry, or lean meat: 1 serving a day. Beans and legumes: 2 or more servings a week. Nuts and seeds: 1-2 servings a day. Whole grains: 6-8 servings a day. Extra-virgin olive oil: 3-4 servings a day. Limit red meat and sweets to only a few servings a month. Lifestyle  Cook and eat meals together with your family, when possible. Drink enough fluid to keep your urine pale yellow. Be physically active every day. This includes: Aerobic exercise like running or swimming. Leisure activities like gardening, walking, or housework. Get 7-8 hours of sleep each night. If recommended by your health care provider, drink red wine in moderation. This means 1  glass a day for nonpregnant women and 2 glasses a day for men. A glass of wine equals 5 oz (150 mL). What foods should I eat? Fruits Apples. Apricots. Avocado. Berries. Bananas. Cherries. Dates. Figs. Grapes. Lemons. Melon. Oranges. Peaches. Plums. Pomegranate. Vegetables Artichokes. Beets. Broccoli. Cabbage. Carrots. Eggplant. Green beans. Chard. Kale. Spinach. Onions. Leeks. Peas. Squash. Tomatoes. Peppers. Radishes. Grains Whole-grain pasta. Brown rice. Bulgur wheat. Polenta. Couscous. Whole-wheat bread. Orpah Cobb. Meats and other proteins Beans. Almonds. Sunflower seeds. Pine nuts. Peanuts. Cod. Salmon. Scallops. Shrimp. Tuna. Tilapia. Clams. Oysters. Eggs. Poultry without skin. Dairy Low-fat milk. Cheese. Greek yogurt. Fats and oils Extra-virgin olive oil. Avocado oil. Grapeseed oil. Beverages Water. Red wine. Herbal tea. Sweets and desserts Greek yogurt with honey. Baked apples. Poached pears. Trail mix. Seasonings and condiments Basil. Cilantro. Coriander. Cumin. Mint. Parsley. Sage. Rosemary. Tarragon. Garlic. Oregano. Thyme. Pepper. Balsamic vinegar. Tahini. Hummus. Tomato sauce. Olives. Mushrooms. The items listed  above may not be a complete list of foods and beverages you can eat. Contact a dietitian for more information. What foods should I limit? This is a list of foods that should be eaten rarely or only on special occasions. Fruits Fruit canned in syrup. Vegetables Deep-fried potatoes (french fries). Grains Prepackaged pasta or rice dishes. Prepackaged cereal with added sugar. Prepackaged snacks with added sugar. Meats and other proteins Beef. Pork. Lamb. Poultry with skin. Hot dogs. Tomasa Blase. Dairy Ice cream. Sour cream. Whole milk. Fats and oils Butter. Canola oil. Vegetable oil. Beef fat (tallow). Lard. Beverages Juice. Sugar-sweetened soft drinks. Beer. Liquor and spirits. Sweets and desserts Cookies. Cakes. Pies. Candy. Seasonings and condiments Mayonnaise. Pre-made sauces and marinades. The items listed above may not be a complete list of foods and beverages you should limit. Contact a dietitian for more information. Summary The Mediterranean diet includes both food and lifestyle choices. Eat a variety of fresh fruits and vegetables, beans, nuts, seeds, and whole grains. Limit the amount of red meat and sweets that you eat. If recommended by your health care provider, drink red wine in moderation. This means 1 glass a day for nonpregnant women and 2 glasses a day for men. A glass of wine equals 5 oz (150 mL). This information is not intended to replace advice given to you by your health care provider. Make sure you discuss any questions you have with your health care provider. Document Revised: 09/28/2019 Document Reviewed: 07/26/2019 Elsevier Patient Education  2023 Else[employer] Inc. Adopting a Healthy Lifestyle.   Weight: Know what a healthy weight is for you (roughly BMI <25) and aim to maintain this. You can calculate your body mass index on your smart phone  Diet: Aim for 7+ servings of fruits and vegetables daily Limit animal fats in diet for cholesterol and heart health - choose  grass fed whenever available Avoid highly processed foods (fast food burgers, tacos, fried chicken, pizza, hot dogs, french fries)  Saturated fat comes in the form of butter, lard, coconut oil, margarine, partially hydrogenated oils, and fat in meat. These increase your risk of cardiovascular disease.  Use healthy plant oils, such as olive, canola, soy, corn, sunflower and peanut.  Whole foods such as fruits, vegetables and whole grains have fiber  Men need > 38 grams of fiber per day Women need > 25 grams of fiber per day  Load up on vegetables and fruits - one-half of your plate: Aim for color and variety, and remember that potatoes dont count. Go for whole grains - one-quarter of  your plate: Whole wheat, barley, wheat berries, quinoa, oats, brown rice, and foods made with them. If you want pasta, go with whole wheat pasta. Protein power - one-quarter of your plate: Fish, chicken, beans, and nuts are all healthy, versatile protein sources. Limit red meat. You need carbohydrates for energy! The type of carbohydrate is more important than the amount. Choose carbohydrates such as vegetables, fruits, whole grains, beans, and nuts in the place of white rice, white pasta, potatoes (baked or fried), macaroni and cheese, cakes, cookies, and donuts.  If youre thirsty, drink water. Coffee and tea are good in moderation, but skip sugary drinks and limit milk and dairy products to one or two daily servings. Keep sugar intake at 6 teaspoons or 24 grams or LESS       Exercise: Aim for 150 min of moderate intensity exercise weekly for heart health, and weights twice weekly for bone health Stay active - any steps are better than no steps! Aim for 7-9 hours of sleep daily             Signed, Levi Aland, NP  02/07/2023 1:38 PM    [facility]

## 2023-02-07 ENCOUNTER — Ambulatory Visit: Payer: Medicare Other | Attending: Physician Assistant | Admitting: Nurse Practitioner

## 2023-02-07 ENCOUNTER — Encounter: Payer: Self-pay | Admitting: Nurse Practitioner

## 2023-02-07 ENCOUNTER — Other Ambulatory Visit: Payer: Self-pay | Admitting: Internal Medicine

## 2023-02-07 ENCOUNTER — Ambulatory Visit (INDEPENDENT_AMBULATORY_CARE_PROVIDER_SITE_OTHER): Payer: Medicare Other

## 2023-02-07 ENCOUNTER — Other Ambulatory Visit: Payer: Self-pay | Admitting: Nurse Practitioner

## 2023-02-07 VITALS — BP 124/70 | HR 84 | Ht 69.0 in | Wt 217.4 lb

## 2023-02-07 DIAGNOSIS — Z8673 Personal history of transient ischemic attack (TIA), and cerebral infarction without residual deficits: Secondary | ICD-10-CM | POA: Diagnosis not present

## 2023-02-07 DIAGNOSIS — E781 Pure hyperglyceridemia: Secondary | ICD-10-CM

## 2023-02-07 DIAGNOSIS — I10 Essential (primary) hypertension: Secondary | ICD-10-CM

## 2023-02-07 DIAGNOSIS — R42 Dizziness and giddiness: Secondary | ICD-10-CM

## 2023-02-07 DIAGNOSIS — E785 Hyperlipidemia, unspecified: Secondary | ICD-10-CM

## 2023-02-07 DIAGNOSIS — G4734 Idiopathic sleep related nonobstructive alveolar hypoventilation: Secondary | ICD-10-CM

## 2023-02-07 DIAGNOSIS — R002 Palpitations: Secondary | ICD-10-CM

## 2023-02-07 NOTE — Patient Instructions (Addendum)
Medication Instructions:   Your physician recommends that you continue on your current medications as directed. Please refer to the Current Medication list given to you today.   *If you need a refill on your cardiac medications before your next appointment, please call your pharmacy*   Lab Work:  None ordered.  If you have labs (blood work) drawn today and your tests are completely normal, you will receive your results only by: MyChart Message (if you have MyChart) OR A paper copy in the mail If you have any lab test that is abnormal or we need to change your treatment, we will call you to review the results.   Testing/Procedures:  Austin Bruce- Long Term Monitor Instructions  Your physician has requested you wear a ZIO patch monitor for 14 days.  This is a single patch monitor. [employer] supplies one patch monitor per enrollment. Additional stickers are not available. Please do not apply patch if you will be having a Nuclear Stress Test,  Echocardiogram, Cardiac CT, MRI, or Chest Xray during the period you would be wearing the  monitor. The patch cannot be worn during these tests. You cannot remove and re-apply the  ZIO XT patch monitor.  Your ZIO patch monitor will be mailed 3 day USPS to your address on file. It may take 3-5 days  to receive your monitor after you have been enrolled.  Once you have received your monitor, please review the enclosed instructions. Your monitor  has already been registered assigning a specific monitor serial # to you.  Billing and Patient Assistance Program Information  We have supplied [employer] with any of your insurance information on file for billing purposes. [employer] offers a sliding scale Patient Assistance Program for patients that do not have  insurance, or whose insurance does not completely cover the cost of the ZIO monitor.  You must apply for the Patient Assistance Program to qualify for this discounted rate.  To apply, please call [employer] at  [phone], select option 4, select option 2, ask to apply for  Patient Assistance Program. Austin Bruce will ask your household income, and how many people  are in your household. They will quote your out-of-pocket cost based on that information.  [employer] will also be able to set up a 85-month, interest-free payment plan if needed.  Applying the monitor   Shave hair from upper left chest.  Hold abrader disc by orange tab. Rub abrader in 40 strokes over the upper left chest as  indicated in your monitor instructions.  Clean area with 4 enclosed alcohol pads. Let dry.  Apply patch as indicated in monitor instructions. Patch will be placed under collarbone on left  side of chest with arrow pointing upward.  Rub patch adhesive wings for 2 minutes. Remove white label marked "1". Remove the white  label marked "2". Rub patch adhesive wings for 2 additional minutes.  While looking in a mirror, press and release button in center of patch. A small green light will  flash 3-4 times. This will be your only indicator that the monitor has been turned on.  Do not shower for the first 24 hours. You may shower after the first 24 hours.  Press the button if you feel a symptom. You will hear a small click. Record Date, Time and  Symptom in the Patient Logbook.  When you are ready to remove the patch, follow instructions on the last 2 pages of Patient  Logbook. Stick patch monitor onto the last page of Patient  Logbook.  Place Patient Logbook in the blue and white box. Use locking tab on box and tape box closed  securely. The blue and white box has prepaid postage on it. Please place it in the mailbox as  soon as possible. Your physician should have your test results approximately 7 days after the  monitor has been mailed back to [facility].  Call [facility] Customer Care at [phone] if you have questions regarding  your ZIO XT patch monitor. Call them immediately if you see an orange light  blinking on your  monitor.  If your monitor falls off in less than 4 days, contact our Monitor department at [phone].  If your monitor becomes loose or falls off after 4 days call [employer] at [phone] for  suggestions on securing your monitor    Follow-Up: At [facility], you and your health needs are our priority.  As part of our continuing mission to provide you with exceptional heart care, we have created designated Provider Care Teams.  These Care Teams include your primary Cardiologist (physician) and Advanced Practice Providers (APPs -  Physician Assistants and Nurse Practitioners) who all work together to provide you with the care you need, when you need it.  We recommend signing up for the patient portal called "MyChart".  Sign up information is provided on this After Visit Summary.  MyChart is used to connect with patients for Virtual Visits (Telemedicine).  Patients are able to view lab/test results, encounter notes, upcoming appointments, etc.  Non-urgent messages can be sent to your provider as well.   To learn more about what you can do with MyChart, go to [email or web address].    Your next appointment:   6 month(s)  Provider:   Armanda Magic, MD     Other Instructions  HOW TO TAKE YOUR BLOOD PRESSURE  Rest 5 minutes before taking your blood pressure. [name]  smoke or drink caffeinated beverages for at least 30 minutes before. Take your blood pressure before (not after) you eat. Sit comfortably with your back supported and both feet on the floor ( Tremain't cross your legs). Elevate your arm to heart level on a table or a desk. Use the proper sized cuff.  It should fit smoothly and snugly around your bare upper arm.  There should be  Enough room to slip a fingertip under the cuff.  The bottom edge of the cuff should be 1 inch above the crease Of the elbow. Please monitor your blood pressure once daily 2 hours after your am medication. If you blood  pressure Consistently remains above 140 (systolic) top number or over 80 ( diastolic) bottom number X 3 days  Consecutively.  Please call our office at [phone] or send Mychart message.     ----Avoid cold medicines with D or DM at the end of them----  Mediterranean Diet A Mediterranean diet refers to food and lifestyle choices that are based on the traditions of countries located on the [employer]. It focuses on eating more fruits, vegetables, whole grains, beans, nuts, seeds, and heart-healthy fats, and eating less dairy, meat, eggs, and processed foods with added sugar, salt, and fat. This way of eating has been shown to help prevent certain conditions and improve outcomes for people who have chronic diseases, like kidney disease and heart disease. What are tips for following this plan? Reading food labels Check the serving size of packaged foods. For foods such as rice and pasta, the serving size refers to the amount of  cooked product, not dry. Check the total fat in packaged foods. Avoid foods that have saturated fat or trans fats. Check the ingredient list for added sugars, such as corn syrup. Shopping  Buy a variety of foods that offer a balanced diet, including: Fresh fruits and vegetables (produce). Grains, beans, nuts, and seeds. Some of these may be available in unpackaged forms or large amounts (in bulk). Fresh seafood. Poultry and eggs. Low-fat dairy products. Buy whole ingredients instead of prepackaged foods. Buy fresh fruits and vegetables in-season from local farmers markets. Buy plain frozen fruits and vegetables. If you do not have access to quality fresh seafood, buy precooked frozen shrimp or canned fish, such as tuna, salmon, or sardines. Stock your pantry so you always have certain foods on hand, such as olive oil, canned tuna, canned tomatoes, rice, pasta, and beans. Cooking Cook foods with extra-virgin olive oil instead of using butter or other vegetable  oils. Have meat as a side dish, and have vegetables or grains as your main dish. This means having meat in small portions or adding small amounts of meat to foods like pasta or stew. Use beans or vegetables instead of meat in common dishes like chili or lasagna. Experiment with different cooking methods. Try roasting, broiling, steaming, and sauting vegetables. Add frozen vegetables to soups, stews, pasta, or rice. Add nuts or seeds for added healthy fats and plant protein at each meal. You can add these to yogurt, salads, or vegetable dishes. Marinate fish or vegetables using olive oil, lemon juice, garlic, and fresh herbs. Meal planning Plan to eat one vegetarian meal one day each week. Try to work up to two vegetarian meals, if possible. Eat seafood two or more times a week. Have healthy snacks readily available, such as: Vegetable sticks with hummus. Greek yogurt. Fruit and nut trail mix. Eat balanced meals throughout the week. This includes: Fruit: 2-3 servings a day. Vegetables: 4-5 servings a day. Low-fat dairy: 2 servings a day. Fish, poultry, or lean meat: 1 serving a day. Beans and legumes: 2 or more servings a week. Nuts and seeds: 1-2 servings a day. Whole grains: 6-8 servings a day. Extra-virgin olive oil: 3-4 servings a day. Limit red meat and sweets to only a few servings a month. Lifestyle  Cook and eat meals together with your family, when possible. Drink enough fluid to keep your urine pale yellow. Be physically active every day. This includes: Aerobic exercise like running or swimming. Leisure activities like gardening, walking, or housework. Get 7-8 hours of sleep each night. If recommended by your health care provider, drink red wine in moderation. This means 1 glass a day for nonpregnant women and 2 glasses a day for men. A glass of wine equals 5 oz (150 mL). What foods should I eat? Fruits Apples. Apricots. Avocado. Berries. Bananas. Cherries. Dates. Figs.  Grapes. Lemons. Melon. Oranges. Peaches. Plums. Pomegranate. Vegetables Artichokes. Beets. Broccoli. Cabbage. Carrots. Eggplant. Green beans. Chard. Kale. Spinach. Onions. Leeks. Peas. Squash. Tomatoes. Peppers. Radishes. Grains Whole-grain pasta. Brown rice. Bulgur wheat. Polenta. Couscous. Whole-wheat bread. Orpah Cobb. Meats and other proteins Beans. Almonds. Sunflower seeds. Pine nuts. Peanuts. Cod. Salmon. Scallops. Shrimp. Tuna. Tilapia. Clams. Oysters. Eggs. Poultry without skin. Dairy Low-fat milk. Cheese. Greek yogurt. Fats and oils Extra-virgin olive oil. Avocado oil. Grapeseed oil. Beverages Water. Red wine. Herbal tea. Sweets and desserts Greek yogurt with honey. Baked apples. Poached pears. Trail mix. Seasonings and condiments Basil. Cilantro. Coriander. Cumin. Mint. Parsley. Sage. Rosemary. Tarragon. Garlic. Oregano.  Thyme. Pepper. Balsamic vinegar. Tahini. Hummus. Tomato sauce. Olives. Mushrooms. The items listed above may not be a complete list of foods and beverages you can eat. Contact a dietitian for more information. What foods should I limit? This is a list of foods that should be eaten rarely or only on special occasions. Fruits Fruit canned in syrup. Vegetables Deep-fried potatoes (french fries). Grains Prepackaged pasta or rice dishes. Prepackaged cereal with added sugar. Prepackaged snacks with added sugar. Meats and other proteins Beef. Pork. Lamb. Poultry with skin. Hot dogs. Tomasa Blase. Dairy Ice cream. Sour cream. Whole milk. Fats and oils Butter. Canola oil. Vegetable oil. Beef fat (tallow). Lard. Beverages Juice. Sugar-sweetened soft drinks. Beer. Liquor and spirits. Sweets and desserts Cookies. Cakes. Pies. Candy. Seasonings and condiments Mayonnaise. Pre-made sauces and marinades. The items listed above may not be a complete list of foods and beverages you should limit. Contact a dietitian for more information. Summary The Mediterranean diet  includes both food and lifestyle choices. Eat a variety of fresh fruits and vegetables, beans, nuts, seeds, and whole grains. Limit the amount of red meat and sweets that you eat. If recommended by your health care provider, drink red wine in moderation. This means 1 glass a day for nonpregnant women and 2 glasses a day for men. A glass of wine equals 5 oz (150 mL). This information is not intended to replace advice given to you by your health care provider. Make sure you discuss any questions you have with your health care provider. Document Revised: 09/28/2019 Document Reviewed: 07/26/2019 Elsevier Patient Education  2023 [employer]. Adopting a Healthy Lifestyle.   Weight: Know what a healthy weight is for you (roughly BMI <25) and aim to maintain this. You can calculate your body mass index on your smart phone  Diet: Aim for 7+ servings of fruits and vegetables daily Limit animal fats in diet for cholesterol and heart health - choose grass fed whenever available Avoid highly processed foods (fast food burgers, tacos, fried chicken, pizza, hot dogs, french fries)  Saturated fat comes in the form of butter, lard, coconut oil, margarine, partially hydrogenated oils, and fat in meat. These increase your risk of cardiovascular disease.  Use healthy plant oils, such as olive, canola, soy, corn, sunflower and peanut.  Whole foods such as fruits, vegetables and whole grains have fiber  Men need > 38 grams of fiber per day Women need > 25 grams of fiber per day  Load up on vegetables and fruits - one-half of your plate: Aim for color and variety, and remember that potatoes dont count. Go for whole grains - one-quarter of your plate: Whole wheat, barley, wheat berries, quinoa, oats, brown rice, and foods made with them. If you want pasta, go with whole wheat pasta. Protein power - one-quarter of your plate: Fish, chicken, beans, and nuts are all healthy, versatile protein sources. Limit red  meat. You need carbohydrates for energy! The type of carbohydrate is more important than the amount. Choose carbohydrates such as vegetables, fruits, whole grains, beans, and nuts in the place of white rice, white pasta, potatoes (baked or fried), macaroni and cheese, cakes, cookies, and donuts.  If youre thirsty, drink water. Coffee and tea are good in moderation, but skip sugary drinks and limit milk and dairy products to one or two daily servings. Keep sugar intake at 6 teaspoons or 24 grams or LESS       Exercise: Aim for 150 min of moderate intensity exercise weekly  for heart health, and weights twice weekly for bone health Stay active - any steps are better than no steps! Aim for 7-9 hours of sleep daily

## 2023-02-07 NOTE — Progress Notes (Unsigned)
ZIO XT serial # [id number] from office inventory applied to patient.  DR. Mayford Knife to read.

## 2023-02-11 DIAGNOSIS — G4733 Obstructive sleep apnea (adult) (pediatric): Secondary | ICD-10-CM | POA: Diagnosis not present

## 2023-02-15 ENCOUNTER — Encounter: Payer: Self-pay | Admitting: Neurology

## 2023-02-15 ENCOUNTER — Ambulatory Visit: Payer: Medicare Other | Admitting: Neurology

## 2023-02-15 ENCOUNTER — Telehealth: Payer: Self-pay | Admitting: Neurology

## 2023-02-15 VITALS — BP 141/81 | HR 70 | Ht 69.0 in | Wt 215.0 lb

## 2023-02-15 DIAGNOSIS — H8112 Benign paroxysmal vertigo, left ear: Secondary | ICD-10-CM | POA: Diagnosis not present

## 2023-02-15 DIAGNOSIS — G459 Transient cerebral ischemic attack, unspecified: Secondary | ICD-10-CM | POA: Diagnosis not present

## 2023-02-15 DIAGNOSIS — R42 Dizziness and giddiness: Secondary | ICD-10-CM

## 2023-02-15 NOTE — Patient Instructions (Signed)
I had a long d/w patient and his wife about his recent TIA and longstanding positional vertigo, risk for recurrent stroke/TIAs, personally independently reviewed imaging studies and stroke evaluation results and answered questions.Continue clopidogrel 75 mg daily  for secondary stroke prevention and maintain strict control of hypertension with blood pressure goal below 130/90, diabetes with hemoglobin A1c goal below 6.5% and lipids with LDL cholesterol goal below 70 mg/dL. I also advised the patient to eat a healthy diet with plenty of whole grains, cereals, fruits and vegetables, exercise regularly and maintain ideal body weight.  Recommend a referral to vestibular rehab for dizziness exercises and I advised him not to make sudden.  Do meclizine as needed for vertigo.  Followup in the future with my nurse practitioner in 6 months or call earlier if necessary.  Benign Positional Vertigo Vertigo is the feeling that you or your surroundings are moving when they are not. Benign positional vertigo is the most common form of vertigo. This is usually a harmless condition (benign). This condition is positional. This means that symptoms are triggered by certain movements and positions. This condition can be dangerous if it occurs while you are doing something that could cause harm to yourself or others. This includes activities such as driving or operating machinery. What are the causes? The inner ear has fluid-filled canals that help your brain sense movement and balance. When the fluid moves, the brain receives messages about your body's position. With benign positional vertigo, calcium crystals in the inner ear break free and disturb the inner ear area. This causes your brain to receive confusing messages about your body's position. What increases the risk? You are more likely to develop this condition if: You are a woman. You are 19 years of age or older. You have recently had a head injury. You have an  inner ear disease. What are the signs or symptoms? Symptoms of this condition usually happen when you move your head or your eyes in different directions. Symptoms may start suddenly and usually last for less than a minute. They include: Loss of balance and falling. Feeling like you are spinning or moving. Feeling like your surroundings are spinning or moving. Nausea and vomiting. Blurred vision. Dizziness. Involuntary eye movement (nystagmus). Symptoms can be mild and cause only minor problems, or they can be severe and interfere with daily life. Episodes of benign positional vertigo may return (recur) over time. Symptoms may also improve over time. How is this diagnosed? This condition may be diagnosed based on: Your medical history. A physical exam of the head, neck, and ears. Positional tests to check for or stimulate vertigo. You may be asked to turn your head and change positions, such as going from sitting to lying down. A health care provider will watch for symptoms of vertigo. You may be referred to a health care provider who specializes in ear, nose, and throat problems (ENT or otolaryngologist) or a provider who specializes in disorders of the nervous system (neurologist). How is this treated?  This condition may be treated in a session in which your health care provider moves your head in specific positions to help the displaced crystals in your inner ear move. Treatment for this condition may take several sessions. Surgery may be needed in severe cases, but this is rare. In some cases, benign positional vertigo may resolve on its own in 2-4 weeks. Follow these instructions at home: Safety Move slowly. Avoid sudden body or head movements or certain positions, as told by  your health care provider. Avoid driving or operating machinery until your health care provider says it is safe. Avoid doing any tasks that would be dangerous to you or others if vertigo occurs. If you have  trouble walking or keeping your balance, try using a cane for stability. If you feel dizzy or unstable, sit down right away. Return to your normal activities as told by your health care provider. Ask your health care provider what activities are safe for you. General instructions Take over-the-counter and prescription medicines only as told by your health care provider. Drink enough fluid to keep your urine pale yellow. Keep all follow-up visits. This is important. Contact a health care provider if: You have a fever. Your condition gets worse or you develop new symptoms. Your family or friends notice any behavioral changes. You have nausea or vomiting that gets worse. You have numbness or a prickling and tingling sensation. Get help right away if you: Have difficulty speaking or moving. Are always dizzy or faint. Develop severe headaches. Have weakness in your legs or arms. Have changes in your hearing or vision. Develop a stiff neck. Develop sensitivity to light. These symptoms may represent a serious problem that is an emergency. Do not wait to see if the symptoms will go away. Get medical help right away. Call your local emergency services (911 in the [place] Do not drive yourself to the hospital. Summary Vertigo is the feeling that you or your surroundings are moving when they are not. Benign positional vertigo is the most common form of vertigo. This condition is caused by calcium crystals in the inner ear that become displaced. This causes a disturbance in an area of the inner ear that helps your brain sense movement and balance. Symptoms include loss of balance and falling, feeling that you or your surroundings are moving, nausea and vomiting, and blurred vision. This condition can be diagnosed based on symptoms, a physical exam, and positional tests. Follow safety instructions as told by your health care provider and keep all follow-up visits. This is important. This information is  not intended to replace advice given to you by your health care provider. Make sure you discuss any questions you have with your health care provider. Document Revised: 07/23/2020 Document Reviewed: 07/23/2020 Elsevier Patient Education  2024 [employer].

## 2023-02-15 NOTE — Telephone Encounter (Signed)
Referral faxed to [facility] in [place]: Phone: [phone]  Fax: [phone]

## 2023-02-15 NOTE — Progress Notes (Signed)
[facility] [address] [place]. [place] [address] [phone]       STROKE FOLLOW UP NOTE  [name] Date of Birth:  1945/12/08 Medical Record Number:  [id number]   Reason for Referral: stroke follow up    SUBJECTIVE:   CHIEF COMPLAINT:  Chief Complaint  Patient presents with   New TIA in March 2024 and chronic positional dizziness        HPI:  Update 02/15/2023 : Patient is seen for follow-up today after last visit with Shanda Bumps nurse practitioner more than a year ago.  Patient states he was seen in the ER on 11/18/2022 and developed sudden onset of, right leg weakness Palpitations.  His vision also became blurry and he describes it likely from moving.  Upon arrival to the ED was found to have right lower extremity drift and sensory loss involving the right face arm and leg blurred vision was improving.  Patient was evaluated by teleneurology and noted to have only mild right-sided deficits.  Not severe enough to justify thrombolysis noncontrast CT head was negative.  CT angiogram of the head and occlusion.  CT perfusion was inconclusive and showed 16 mm perfusion deficit which was in multiple vascular territories and did not localize to his presenting symptoms.  MRI scan of the brain was negative for acute stroke and showed multiple old lacunar LDL cholesterol was 29 mg percent.  Hemoglobin A1c was 5.9.  Echocardiogram showed ejection fraction of 60 to 65% without cardiac source of embolism.  Patient is currently wearing a 2-week Zio patch to look for paroxysmal A-fib.  Patient states is done well since discharge.  Has had no recurrent TIA or stroke symptoms.  He remains on Plavix which is tolerating well with minor bruising and no bleeding.  Blood pressure is under good control.  He is tolerating Crestor well without muscle aches or pains.  He continues to have intermittent dizziness which he describes as mostly positional when he makes a sudden movement or  gets up quickly particularly to the left.  He has never been seen by physical therapy and vestibular stabilization exercises.  He does have decreased hearing bilaterally.  He has never been formally diagnosed with benign paroxysmal positional vertigo. Update 12/16/2021 JM: Patient returns for 36-month stroke follow-up accompanied by his wife, Austin Bruce.  Overall stable without new or reoccurring stroke/TIA symptoms.  Chronic gait impairment stable, no use of AD, no recent falls.  Reports continued intermittent vertigo, stable since prior visit, occasional use of meclizine with benefit managed by PCP.  Compliant on Plavix and simvastatin, denies side effects.  Blood pressure today 142/88 - has not yet taken morning medications.  He has follow-up visit scheduled tomorrow with PCP Dr. Mayford Knife.  No further concerns at this time.    History provided for reference purposes only Initial visit 06/10/2021 JM: Austin Bruce is being seen for hospital follow-up accompanied by his wife, Austin Bruce.  Overall doing well.  Denies new or reoccurring stroke/TIA symptoms.  Chronic gait impairment with imbalance since prior stroke - denies any worsening. Occasional baseline vertigo with use of meclizine with benefit (managed by PCP).  He has tried to return back to prior activities such as yard work and home improvements (such as painting) but his kids will not allow him to do so.  Also worsening lower back pain limiting - plans to schedule f/u with neuro surg.   Remains on Plavix and simvastatin without side effects.  Blood pressure today 145/81. Routinely  monitors - does fluctuate but has been modifying diet and avoiding high sodium foods. 30-day cardiac event monitor completed but only worn for 9 days but did not show evidence of atrial fibrillation.  Has appt with PCP in Nov for physical and lab work.  No further concerns at this time.   Stroke admission 03/28/2021 Austin Bruce is a 77 y.o. male with history of Htn, Hld and a  previous cerebellar stroke who presented on 03/28/2021 after an intense bout of vertigo and lightheadedness associated with bilateral numbness, right > left.  Personally reviewed hospitalization pertinent progress notes, lab work and imaging.  Evaluated by Dr. Roda Shutters.  Episode of vertigo likely possible BPPV and referred to outpatient vestibular PT.  Incidental finding of small acute/subacute white matter infarct in the left frontal lobe, MCA/ACA watershed area likely secondary to small vessel disease but cannot rule out cardioembolic source.  Evidence of chronic lacunar infarcts in the right thalamic and PLIC as well as chronic microhemorrhages likely secondary to chronic hypertension on MRI.  CTA head/neck no significant stenosis.  EF 60 to 65%.  Recommended 30-day cardiac event monitor to rule out A. fib.  LDL 50.  A1c 6.1 (no hx/dx of DM).  Initiated Plavix 75 mg daily - ASA not recommended due to history of LGIB on ASA.  Resumed home Lozol and Inderal for HTN as well as Zocor for HLD.  PT/OT recommended outpatient therapies and discharged home.   PERTINENT IMAGING  CT HEAD 03/28/2021 IMPRESSION: 1. No evidence of acute intracranial abnormality. 2. Severe chronic small vessel ischemic disease. 3. Small chronic cerebellar infarct.  MR BRAIN 03/28/2021 [facility] HEAD/NECK IMPRESSION: 1. Small acute or subacute white matter infarct in the left frontal lobe. 2. Severe chronic small vessel ischemic disease with chronic lacunar infarcts in the cerebrum and cerebellum. 3. No major arterial occlusion or convincing flow limiting proximal stenosis in the head and neck within limitations of artifact as detailed above.  2D ECHO  IMPRESSIONS   1. Left ventricular ejection fraction, by estimation, is 60 to 65%. The  left ventricle has normal function. The left ventricle has no regional  wall motion abnormalities. Left ventricular diastolic parameters were  normal.   2. Right ventricular systolic function is  normal. The right ventricular  size is normal. Tricuspid regurgitation signal is inadequate for assessing  PA pressure.   3. The mitral valve is grossly normal. Trivial mitral valve  regurgitation.   4. The aortic valve is tricuspid. Aortic valve regurgitation is not  visualized. Mild aortic valve sclerosis is present, with no evidence of  aortic valve stenosis. Aortic valve mean gradient measures 5.0 mmHg.  CARDIAC MONITOR Per report by Dr. Nona Dell: 9 days analyzed.  Predominant rhythm is sinus with IVCD, heart rate ranging from 53 bpm up to 101 bpm and average heart rate 64 bpm.  No atrial fibrillation was noted.  There were no pauses.        ROS:   14 system review of systems performed and negative with exception of those listed in HPI  PMH:  Past Medical History:  Diagnosis Date   Aphakia, right eye 07/02/2021   Onset post cataract surgery with dislocation of IOL 07/01/2021  Vitrectomy, insertion three-piece IOL sulcus 07/03/2021   Choroidal detachment, serous, right eye 08/12/2021   Late onset post vitrectomy for dislocated IOL, retained lens fragments and IOL repositioning  08/19/2021, resolved choroidal detachments post topical therapy with atropine   High cholesterol    Hypertension  OSA (obstructive sleep apnea)    Intolerant to CPAP   Stroke (HCC)     PSH:  Past Surgical History:  Procedure Laterality Date   CATARACT EXTRACTION Right 07/01/2021   Dislocated IOL   HAND SURGERY     HERNIA REPAIR     NOSE SURGERY     TONSILLECTOMY      Social History:  Social History   Socioeconomic History   Marital status: Married    Spouse name: Not on file   Number of children: Not on file   Years of education: Not on file   Highest education level: Not on file  Occupational History   Not on file  Tobacco Use   Smoking status: Former    Packs/day: 3    Types: Cigarettes    Start date: 09/06/1956    Quit date: 09/07/1987    Years since quitting: 35.4    Smokeless tobacco: Never   Tobacco comments:    Pt reports that at the most he smoked 3ppd  Vaping Use   Vaping Use: Never used  Substance and Sexual Activity   Alcohol use: Not Currently   Drug use: Never   Sexual activity: Not on file  Other Topics Concern   Not on file  Social History Narrative   Not on file   Social Determinants of Health   Financial Resource Strain: Not on file  Food Insecurity: No Food Insecurity (11/19/2022)   Hunger Vital Sign    Worried About Running Out of Food in the Last Year: Never true    Ran Out of Food in the Last Year: Never true  Transportation Needs: No Transportation Needs (11/19/2022)   PRAPARE - Administrator, Civil Service (Medical): No    Lack of Transportation (Non-Medical): No  Physical Activity: Not on file  Stress: Not on file  Social Connections: Not on file  Intimate Partner Violence: Not At Risk (11/19/2022)   Humiliation, Afraid, Rape, and Kick questionnaire    Fear of Current or Ex-Partner: No    Emotionally Abused: No    Physically Abused: No    Sexually Abused: No    Family History: History reviewed. No pertinent family history.  Medications:   Current Outpatient Medications on File Prior to Visit  Medication Sig Dispense Refill   amLODipine (NORVASC) 10 MG tablet Take 10 mg by mouth daily.     clopidogrel (PLAVIX) 75 MG tablet Take 75 mg by mouth daily.     indapamide (LOZOL) 2.5 MG tablet Take 2.5 mg by mouth daily.     levothyroxine (SYNTHROID) 25 MCG tablet Take 25 mcg by mouth daily.     lisinopril (ZESTRIL) 40 MG tablet Take 40 mg by mouth daily.     loratadine (CLARITIN) 10 MG tablet Take 10 mg by mouth daily.     meclizine (ANTIVERT) 12.5 MG tablet Take 12.5 mg by mouth 3 (three) times daily as needed for nausea.     Omega-3 Fatty Acids (FISH OIL) 1200 MG CAPS Take 1 capsule by mouth daily.     OXYGEN Inhale 2 L into the lungs every evening.     Potassium Chloride ER 20 MEQ TBCR Take by mouth.  Take  40 mEq by mouth     RESTASIS 0.05 % ophthalmic emulsion Apply to eye.     rosuvastatin (CRESTOR) 20 MG tablet Take 1 tablet (20 mg total) by mouth daily. 30 tablet 5   tamsulosin (FLOMAX) 0.4 MG CAPS capsule Take 0.4  mg by mouth daily.     benzonatate (TESSALON) 100 MG capsule Take 100 mg by mouth 3 (three) times daily as needed. (Patient not taking: Reported on 02/07/2023)     No current facility-administered medications on file prior to visit.    Allergies:   Allergies  Allergen Reactions   Penicillins Anaphylaxis      OBJECTIVE:  Physical Exam  Vitals:   02/15/23 0825  BP: (!) 141/81  Pulse: 70  Weight: 215 lb (97.5 kg)  Height: 5\' 9"  (1.753 m)    Body mass index is 31.75 kg/m. No results found.  General: well developed, well nourished, very pleasant elderly Caucasian male, seated, in no evident distress Head: head normocephalic and atraumatic.   Neck: supple with no carotid or supraclavicular bruits Cardiovascular: regular rate and rhythm, no murmurs Musculoskeletal: no deformity Skin:  no rash/petichiae Vascular:  Normal pulses all extremities   Neurologic Exam Mental Status: Awake and fully alert.  Fluent speech and language.  Oriented to place and time. Recent and remote memory intact. Attention span, concentration and fund of knowledge appropriate. Mood and affect appropriate.  Cranial Nerves: OS Pupils briskly reactive to light. OD Irregular large pupil (chronic). extraocular movements full without nystagmus. Visual fields full to confrontation. Hearing intact. Facial sensation intact. Face, tongue, palate moves normally and symmetrically.  Motor: Normal bulk and tone. Normal strength in all tested extremity muscles Sensory.: intact to touch , pinprick , position and vibratory sensation.  Coordination: Rapid alternating movements normal in all extremities. Finger-to-nose and heel-to-shin performed accurately bilaterally.  Headshaking.test no nystagmus with  subjective dizziness.  Fukuda stepping test positive with patient rotating to the left and moving of the base. Gait and Station: Arises from chair without difficulty. Stance is normal. Gait demonstrates normal stride length and mild imbalance and slight favoring of right leg (c/o hip pain) without use of assistive device.  Unable to complete tandem walk and heel toe.  Reflexes: 1+ and symmetric. Toes downgoing.         ASSESSMENT: Austin Bruce is a 77 y.o. year old male with longstanding dizziness which is positional likely from BPPV and incidental finding (associated with presenting symptoms) of small acute/subacute laminar infarct in the left frontal lobe and MCA/ACA watershed area likely secondary to small vessel disease  .  Recent episode of transient dizziness, right leg weakness and sensory deficit possibly posterior circulation TIA in March 2024 also from small vessel disease.  Vascular risk factors include prior strokes on imaging (cerebrum and cerebellum), HTN, HLD, advanced age and former tobacco and EtOH use.      PLAN:  I had a long d/w patient and his wife about his recent TIA and longstanding positional vertigo, risk for recurrent stroke/TIAs, personally independently reviewed imaging studies and stroke evaluation results and answered questions.Continue clopidogrel 75 mg daily  for secondary stroke prevention and maintain strict control of hypertension with blood pressure goal below 130/90, diabetes with hemoglobin A1c goal below 6.5% and lipids with LDL cholesterol goal below 70 mg/dL. I also advised the patient to eat a healthy diet with plenty of whole grains, cereals, fruits and vegetables, exercise regularly and maintain ideal body weight.  Recommend a referral to vestibular rehab for dizziness exercises and I advised him not to make sudden.  Use meclizine as needed for vertigo.  Followup in the future with my nurse practitioner in 6 months or call earlier if necessary. Greater  than 50% time during this 40-minute visit was spent in  counseling and coordination of care about his TIA and small vessel disease as well as positional vertigo discussion about evaluation and treatment plan and answering questions Delia Heady, MD  [facility] [address] [place], [place] [address]  Phone [phone] Fax [phone] Note: This document was prepared with digital dictation and possible smart phrase technology. Any transcriptional errors that result from this process are unintentional.

## 2023-02-25 DIAGNOSIS — Z8673 Personal history of transient ischemic attack (TIA), and cerebral infarction without residual deficits: Secondary | ICD-10-CM | POA: Diagnosis not present

## 2023-02-25 DIAGNOSIS — R002 Palpitations: Secondary | ICD-10-CM | POA: Diagnosis not present

## 2023-03-01 ENCOUNTER — Other Ambulatory Visit: Payer: Self-pay | Admitting: *Deleted

## 2023-03-01 MED ORDER — METOPROLOL SUCCINATE ER 25 MG PO TB24: 12.5000 mg | ORAL_TABLET | Freq: Every day | ORAL | 3 refills | Status: DC

## 2023-03-02 DIAGNOSIS — E039 Hypothyroidism, unspecified: Secondary | ICD-10-CM | POA: Diagnosis not present

## 2023-03-02 DIAGNOSIS — M25569 Pain in unspecified knee: Secondary | ICD-10-CM | POA: Diagnosis not present

## 2023-03-02 DIAGNOSIS — I10 Essential (primary) hypertension: Secondary | ICD-10-CM | POA: Diagnosis not present

## 2023-03-02 DIAGNOSIS — Z8673 Personal history of transient ischemic attack (TIA), and cerebral infarction without residual deficits: Secondary | ICD-10-CM | POA: Diagnosis not present

## 2023-03-13 DIAGNOSIS — G4733 Obstructive sleep apnea (adult) (pediatric): Secondary | ICD-10-CM | POA: Diagnosis not present

## 2023-03-30 DIAGNOSIS — H6990 Unspecified Eustachian tube disorder, unspecified ear: Secondary | ICD-10-CM | POA: Diagnosis not present

## 2023-03-30 DIAGNOSIS — H6120 Impacted cerumen, unspecified ear: Secondary | ICD-10-CM | POA: Diagnosis not present

## 2023-04-07 DIAGNOSIS — H27131 Posterior dislocation of lens, right eye: Secondary | ICD-10-CM | POA: Diagnosis not present

## 2023-04-07 DIAGNOSIS — H2512 Age-related nuclear cataract, left eye: Secondary | ICD-10-CM | POA: Diagnosis not present

## 2023-04-07 DIAGNOSIS — G4733 Obstructive sleep apnea (adult) (pediatric): Secondary | ICD-10-CM | POA: Diagnosis not present

## 2023-04-07 DIAGNOSIS — H59021 Cataract (lens) fragments in eye following cataract surgery, right eye: Secondary | ICD-10-CM | POA: Diagnosis not present

## 2023-04-13 DIAGNOSIS — G4733 Obstructive sleep apnea (adult) (pediatric): Secondary | ICD-10-CM | POA: Diagnosis not present

## 2023-05-06 DIAGNOSIS — I10 Essential (primary) hypertension: Secondary | ICD-10-CM | POA: Diagnosis not present

## 2023-05-06 DIAGNOSIS — Z8673 Personal history of transient ischemic attack (TIA), and cerebral infarction without residual deficits: Secondary | ICD-10-CM | POA: Diagnosis not present

## 2023-05-06 DIAGNOSIS — E782 Mixed hyperlipidemia: Secondary | ICD-10-CM | POA: Diagnosis not present

## 2023-05-13 ENCOUNTER — Ambulatory Visit: Payer: Medicare Other | Admitting: Pulmonary Disease

## 2023-05-13 ENCOUNTER — Encounter: Payer: Self-pay | Admitting: Pulmonary Disease

## 2023-05-13 VITALS — BP 143/81 | HR 78 | Wt 219.4 lb

## 2023-05-13 DIAGNOSIS — R0683 Snoring: Secondary | ICD-10-CM

## 2023-05-13 DIAGNOSIS — G4734 Idiopathic sleep related nonobstructive alveolar hypoventilation: Secondary | ICD-10-CM

## 2023-05-13 NOTE — Patient Instructions (Addendum)
X Home sleep test - this will also check your oxygen levels

## 2023-05-13 NOTE — Assessment & Plan Note (Signed)
His previous sleep study did not show significant OSA, was less than 4 hours likely suboptimal study.  It did show nocturnal hypoxia and he has been maintained on oxygen since then. He does not feel that oxygen has helped him much and he does not have underlying lung disease to explain his nocturnal desaturation. We will repeat sleep study as a home sleep test.  This will also allow [place] to assess if  hypoxia has improved

## 2023-05-13 NOTE — Progress Notes (Signed)
   Subjective:    Patient ID: Austin Bruce, male    DOB: 1945/11/28, 77 y.o.   MRN: [id number]  HPI  77 yo remote smoker  for FU of nocturnal hypoxia. -on O2 since 02/2022  He is accompanied by his wife [name] who corroborates history   PMH - hypertension, HLD, TIA in 2017 at [facility] and CVA 03/29/2021.  MRI revealed small acute or subacute white matter infarct in the left frontal lobe with severe chronic small vessel ischemic disease and chronic lacunar infarcts in the cerebrum and cerebellum.  He smoked about 20 pack years before he quit at age 37  10-month follow-up visit. He continues to use oxygen during sleep.  He is not convinced that this helps him.  Wife reports of snoring she feels that oxygen is helping He sleeps on his left side, bedtime around 9:30 PM, wakes up around 6 AM.  No choking or gasping episodes.  He does nap in the daytime denies excessive somnolence   Significant tests/ events reviewed   01/2022 NPSG TST 233 mins, AHI 2/h, low sat 86%, severe PLMs but arousal index 7/h, saturation less than 88% for 123 minutes  Review of Systems neg for any significant sore throat, dysphagia, itching, sneezing, nasal congestion or excess/ purulent secretions, fever, chills, sweats, unintended wt loss, pleuritic or exertional cp, hempoptysis, orthopnea pnd or change in chronic leg swelling. Also denies presyncope, palpitations, heartburn, abdominal pain, nausea, vomiting, diarrhea or change in bowel or urinary habits, dysuria,hematuria, rash, arthralgias, visual complaints, headache, numbness weakness or ataxia.     Objective:   Physical Exam  Gen. Pleasant, obese, in no distress ENT - no lesions, no post nasal drip Neck: No JVD, no thyromegaly, no carotid bruits Lungs: no use of accessory muscles, no dullness to percussion, decreased without rales or rhonchi  Cardiovascular: Rhythm regular, heart sounds  normal, no murmurs or gallops, no peripheral edema Musculoskeletal: No  deformities, no cyanosis or clubbing , no tremors       Assessment & Plan:

## 2023-05-14 DIAGNOSIS — G4733 Obstructive sleep apnea (adult) (pediatric): Secondary | ICD-10-CM | POA: Diagnosis not present

## 2023-05-17 DIAGNOSIS — H2512 Age-related nuclear cataract, left eye: Secondary | ICD-10-CM | POA: Diagnosis not present

## 2023-05-17 DIAGNOSIS — Z961 Presence of intraocular lens: Secondary | ICD-10-CM | POA: Diagnosis not present

## 2023-05-26 DIAGNOSIS — E7849 Other hyperlipidemia: Secondary | ICD-10-CM | POA: Diagnosis not present

## 2023-05-26 DIAGNOSIS — E876 Hypokalemia: Secondary | ICD-10-CM | POA: Diagnosis not present

## 2023-05-26 DIAGNOSIS — I10 Essential (primary) hypertension: Secondary | ICD-10-CM | POA: Diagnosis not present

## 2023-05-26 DIAGNOSIS — E039 Hypothyroidism, unspecified: Secondary | ICD-10-CM | POA: Diagnosis not present

## 2023-05-31 DIAGNOSIS — Z8673 Personal history of transient ischemic attack (TIA), and cerebral infarction without residual deficits: Secondary | ICD-10-CM | POA: Diagnosis not present

## 2023-05-31 DIAGNOSIS — E039 Hypothyroidism, unspecified: Secondary | ICD-10-CM | POA: Diagnosis not present

## 2023-05-31 DIAGNOSIS — Z23 Encounter for immunization: Secondary | ICD-10-CM | POA: Diagnosis not present

## 2023-05-31 DIAGNOSIS — M25569 Pain in unspecified knee: Secondary | ICD-10-CM | POA: Diagnosis not present

## 2023-05-31 DIAGNOSIS — I10 Essential (primary) hypertension: Secondary | ICD-10-CM | POA: Diagnosis not present

## 2023-06-02 DIAGNOSIS — C44622 Squamous cell carcinoma of skin of right upper limb, including shoulder: Secondary | ICD-10-CM | POA: Diagnosis not present

## 2023-06-10 DIAGNOSIS — H25812 Combined forms of age-related cataract, left eye: Secondary | ICD-10-CM | POA: Diagnosis not present

## 2023-06-13 DIAGNOSIS — G4733 Obstructive sleep apnea (adult) (pediatric): Secondary | ICD-10-CM | POA: Diagnosis not present

## 2023-07-14 DIAGNOSIS — G4733 Obstructive sleep apnea (adult) (pediatric): Secondary | ICD-10-CM | POA: Diagnosis not present

## 2023-07-29 ENCOUNTER — Ambulatory Visit: Payer: Medicare Other | Admitting: Cardiology

## 2023-08-05 ENCOUNTER — Encounter: Payer: Self-pay | Admitting: Emergency Medicine

## 2023-08-05 ENCOUNTER — Ambulatory Visit
Admission: EM | Admit: 2023-08-05 | Discharge: 2023-08-05 | Disposition: A | Discharge status: Home / Self Care | Payer: Medicare Other | Attending: Family Medicine | Admitting: Family Medicine

## 2023-08-05 DIAGNOSIS — R109 Unspecified abdominal pain: Secondary | ICD-10-CM | POA: Diagnosis not present

## 2023-08-05 DIAGNOSIS — R3129 Other microscopic hematuria: Secondary | ICD-10-CM

## 2023-08-05 LAB — POCT URINALYSIS DIP (MANUAL ENTRY)
Bilirubin, UA: NEGATIVE
Glucose, UA: NEGATIVE mg/dL
Ketones, POC UA: NEGATIVE mg/dL
Leukocytes, UA: NEGATIVE
Nitrite, UA: NEGATIVE
Protein Ur, POC: NEGATIVE mg/dL
Spec Grav, UA: 1.015 (ref 1.010–1.025)
Urobilinogen, UA: 0.2 U/dL
pH, UA: 5.5 (ref 5.0–8.0)

## 2023-08-05 MED ORDER — TAMSULOSIN HCL 0.4 MG PO CAPS: 0.4000 mg | ORAL_CAPSULE | Freq: Every day | ORAL | 0 refills | Status: DC

## 2023-08-05 MED ORDER — HYDROCODONE-ACETAMINOPHEN 5-325 MG PO TABS: 1.0000 | ORAL_TABLET | Freq: Two times a day (BID) | ORAL | 0 refills | Status: DC | PRN

## 2023-08-05 NOTE — ED Triage Notes (Signed)
Right flank pain since last Wednesday.  Has been taking Advil.

## 2023-08-05 NOTE — ED Provider Notes (Signed)
[facility]    CSN: [id number] Arrival date & time: 08/05/23  1501      History   Chief Complaint No chief complaint on file.   HPI Austin Bruce is a 77 y.o. male.   Patient presenting today with over a week of sharp constant right flank pain.  States the severity comes and goes but it can be severe.  Denies dysuria, hematuria, fever, chills, nausea, vomiting, abdominal pain, injury to the area.  So far trying Aleve with mild temporary benefit.  No past history of similar issues.    Past Medical History:  Diagnosis Date   Aphakia, right eye 07/02/2021   Onset post cataract surgery with dislocation of IOL 07/01/2021  Vitrectomy, insertion three-piece IOL sulcus 07/03/2021   Choroidal detachment, serous, right eye 08/12/2021   Late onset post vitrectomy for dislocated IOL, retained lens fragments and IOL repositioning  08/19/2021, resolved choroidal detachments post topical therapy with atropine   High cholesterol    Hypertension    OSA (obstructive sleep apnea)    Intolerant to CPAP   Stroke [facility])     Patient Active Problem List   Diagnosis Date Noted   Right leg weakness 11/18/2022   Hyponatremia 11/18/2022   Weakness of right lower extremity 11/18/2022   Age-related nuclear cataract, left 04/13/2022   Nocturnal hypoxia 12/03/2021   Pseudophakia of right eye 10/14/2021   Snores 08/12/2021   Anisocoria 07/08/2021   Dislocated IOL (intraocular lens), posterior, right 07/02/2021   Cataract (lens) fragments in eye following cataract surgery, right eye 07/02/2021   Vitreomacular adhesion of right eye 07/02/2021   BPPV (benign paroxysmal positional vertigo) 03/29/2021   Acute CVA (cerebrovascular accident) (HCC) 03/28/2021   History of CVA (cerebrovascular accident) 03/28/2021   Former smoker 03/28/2021   Hypertension    High cholesterol    Vertigo    Bilateral leg numbness    History of GI bleed    Hypokalemia    CKD stage G3b/A1, GFR 30-44 and albumin  creatinine ratio <30 mg/g [facility])     Past Surgical History:  Procedure Laterality Date   CATARACT EXTRACTION Right 07/01/2021   Dislocated IOL   HAND SURGERY     HERNIA REPAIR     NOSE SURGERY     TONSILLECTOMY         Home Medications    Prior to Admission medications   Medication Sig Start Date End Date Taking? Authorizing Provider  HYDROcodone-acetaminophen (NORCO/VICODIN) 5-325 MG tablet Take 1 tablet by mouth 2 (two) times daily as needed for moderate pain (pain score 4-6). 08/05/23  Yes Particia Nearing, PA-C  tamsulosin (FLOMAX) 0.4 MG CAPS capsule Take 1 capsule (0.4 mg total) by mouth daily. 08/05/23  Yes Particia Nearing, PA-C  amLODipine (NORVASC) 10 MG tablet Take 10 mg by mouth daily.    [provider]  clopidogrel (PLAVIX) 75 MG tablet Take 75 mg by mouth daily.    [provider]  indapamide (LOZOL) 2.5 MG tablet Take 2.5 mg by mouth daily.    [provider]  levothyroxine (SYNTHROID) 25 MCG tablet Take 25 mcg by mouth daily.    [provider]  lisinopril (ZESTRIL) 40 MG tablet Take 40 mg by mouth daily.    [provider]  loratadine (CLARITIN) 10 MG tablet Take 10 mg by mouth daily.    [provider]  meclizine (ANTIVERT) 12.5 MG tablet Take 12.5 mg by mouth 3 (three) times daily as needed for  nausea. 07/28/22   [provider]  metoprolol succinate (TOPROL XL) 25 MG 24 hr tablet Take 0.5 tablets (12.5 mg total) by mouth at bedtime. 03/01/23 02/29/24  Swinyer, Zachary George, NP  Omega-3 Fatty Acids (FISH OIL) 1200 MG CAPS Take 1 capsule by mouth daily.    [provider]  OXYGEN Inhale 2 L into the lungs every evening.    [provider]  Potassium Chloride ER 20 MEQ TBCR Take by mouth.  Take 40 mEq by mouth    [provider]  RESTASIS 0.05 % ophthalmic emulsion Apply to eye. 06/15/22   [provider]  rosuvastatin (CRESTOR) 20 MG tablet Take 1 tablet (20  mg total) by mouth daily. 01/19/22   Quintella Reichert, MD  tamsulosin (FLOMAX) 0.4 MG CAPS capsule Take 0.4 mg by mouth daily. 11/14/22   [provider]    Family History History reviewed. No pertinent family history.  Social History Social History   Tobacco Use   Smoking status: Former    Current packs/day: 0.00    Average packs/day: 3.0 packs/day for 31.0 years (93.0 ttl pk-yrs)    Types: Cigarettes    Start date: 09/06/1956    Quit date: 09/07/1987    Years since quitting: 35.9   Smokeless tobacco: Never   Tobacco comments:    Pt reports that at the most he smoked 3ppd  Vaping Use   Vaping status: Never Used  Substance Use Topics   Alcohol use: Not Currently   Drug use: Never     Allergies   Penicillins   Review of Systems Review of Systems Per HPI  Physical Exam Triage Vital Signs ED Triage Vitals  Encounter Vitals Group     BP 08/05/23 1521 (!) 154/79     Systolic BP Percentile --      Diastolic BP Percentile --      Pulse Rate 08/05/23 1521 81     Resp 08/05/23 1521 18     Temp 08/05/23 1521 98.2 F (36.8 C)     Temp Source 08/05/23 1521 Oral     SpO2 08/05/23 1521 95 %     Weight --      Height --      Head Circumference --      Peak Flow --      Pain Score 08/05/23 1522 0     Pain Loc --      Pain Education --      Exclude from Growth Chart --    No data found.  Updated Vital Signs BP (!) 154/79 (BP Location: Right Arm)   Pulse 81   Temp 98.2 F (36.8 C) (Oral)   Resp 18   SpO2 95%   Visual Acuity Right Eye Distance:   Left Eye Distance:   Bilateral Distance:    Right Eye Near:   Left Eye Near:    Bilateral Near:     Physical Exam Vitals and nursing note reviewed.  Constitutional:      Appearance: Normal appearance.  HENT:     Head: Atraumatic.  Eyes:     Extraocular Movements: Extraocular movements intact.     Conjunctiva/sclera: Conjunctivae normal.  Cardiovascular:     Rate and Rhythm: Normal rate and regular  rhythm.  Pulmonary:     Effort: Pulmonary effort is normal.     Breath sounds: Normal breath sounds.  Abdominal:     General: Bowel sounds are normal. There is no distension.  Palpations: Abdomen is soft.     Tenderness: There is no abdominal tenderness. There is no right CVA tenderness, left CVA tenderness or guarding.  Musculoskeletal:        General: No swelling, tenderness or signs of injury. Normal range of motion.     Cervical back: Normal range of motion and neck supple.  Skin:    General: Skin is warm and dry.  Neurological:     General: No focal deficit present.     Mental Status: He is oriented to person, place, and time.     Motor: No weakness.     Gait: Gait normal.  Psychiatric:        Mood and Affect: Mood normal.        Thought Content: Thought content normal.        Judgment: Judgment normal.      UC Treatments / Results  Labs (all labs ordered are listed, but only abnormal results are displayed) Labs Reviewed  POCT URINALYSIS DIP (MANUAL ENTRY) - Abnormal; Notable for the following components:      Result Value   Blood, UA trace-intact (*)    All other components within normal limits    EKG   Radiology No results found.  Procedures Procedures (including critical care time)  Medications Ordered in UC Medications - No data to display  Initial Impression / Assessment and Plan / UC Course  I have reviewed the triage vital signs and the nursing notes.  Pertinent labs & imaging results that were available during my care of the patient were reviewed by me and considered in my medical decision making (see chart for details).     Given location and quality of pain, trace blood on urinalysis possibly kidney stone.  Trial Flomax, Norco, over-the-counter pain relievers, fluids and follow-up as soon as possible with primary care provider.  ED precautions given.  Final Clinical Impressions(s) / UC Diagnoses   Final diagnoses:  Right flank pain   Microscopic hematuria     Discharge Instructions      Your pain may be caused by a kidney stone.  We are unable to definitively confirm this as we do not have the ability to perform a CT scan here but given the blood in your urine and the location of your pain this is my suspicion.  I have sent in a medication called Flomax to help dilate the urinary tubes and some pain medication to help in addition to the over-the-counter pain medication.  Follow-up with your primary care as soon as possible and go to the ER if your symptoms are worsening    ED Prescriptions     Medication Sig Dispense Auth. Provider   tamsulosin (FLOMAX) 0.4 MG CAPS capsule Take 1 capsule (0.4 mg total) by mouth daily. 14 capsule Particia Nearing, [place]   HYDROcodone-acetaminophen (NORCO/VICODIN) 5-325 MG tablet Take 1 tablet by mouth 2 (two) times daily as needed for moderate pain (pain score 4-6). 10 tablet Particia Nearing, [place]      I have reviewed the PDMP during this encounter.   Particia Nearing, [place] 08/05/23 1659

## 2023-08-05 NOTE — Discharge Instructions (Signed)
Your pain may be caused by a kidney stone.  We are unable to definitively confirm this as we do not have the ability to perform a CT scan here but given the blood in your urine and the location of your pain this is my suspicion.  I have sent in a medication called Flomax to help dilate the urinary tubes and some pain medication to help in addition to the over-the-counter pain medication.  Follow-up with your primary care as soon as possible and go to the ER if your symptoms are worsening

## 2023-08-08 DIAGNOSIS — R319 Hematuria, unspecified: Secondary | ICD-10-CM | POA: Diagnosis not present

## 2023-08-08 DIAGNOSIS — M545 Low back pain, unspecified: Secondary | ICD-10-CM | POA: Diagnosis not present

## 2023-08-13 DIAGNOSIS — G4733 Obstructive sleep apnea (adult) (pediatric): Secondary | ICD-10-CM | POA: Diagnosis not present

## 2023-08-23 ENCOUNTER — Encounter: Payer: Self-pay | Admitting: Adult Health

## 2023-08-23 ENCOUNTER — Ambulatory Visit: Payer: Medicare Other | Attending: Cardiology | Admitting: Cardiology

## 2023-08-23 ENCOUNTER — Ambulatory Visit: Payer: Medicare Other | Admitting: Adult Health

## 2023-08-23 VITALS — BP 140/79 | HR 73 | Ht 69.0 in | Wt 219.0 lb

## 2023-08-23 DIAGNOSIS — H8112 Benign paroxysmal vertigo, left ear: Secondary | ICD-10-CM

## 2023-08-23 DIAGNOSIS — Z8673 Personal history of transient ischemic attack (TIA), and cerebral infarction without residual deficits: Secondary | ICD-10-CM | POA: Diagnosis not present

## 2023-08-23 NOTE — Patient Instructions (Signed)
Referral placed to [facility] in [place] - you can call them at the end of this week to schedule if you do not hear from them prior  Would recommend you try to do as much as activity as tolerated and maintain independence as long as safety permits.   Continue meclizine as needed  Continue clopidogrel 75 mg daily  and Crestor  for secondary stroke prevention  Continue to follow up with PCP regarding blood pressure and cholesterol management  Maintain strict control of hypertension with blood pressure goal below 130/90, diabetes with hemoglobin A1c goal below 7.0 % and cholesterol with LDL cholesterol (bad cholesterol) goal below 70 mg/dL.   Signs of a Stroke? Follow the BEFAST method:  Balance Watch for a sudden loss of balance, trouble with coordination or vertigo Eyes Is there a sudden loss of vision in one or both eyes? Or double vision?  Face: Ask the person to smile. Does one side of the face droop or is it numb?  Arms: Ask the person to raise both arms. Does one arm drift downward? Is there weakness or numbness of a leg? Speech: Ask the person to repeat a simple phrase. Does the speech sound slurred/strange? Is the person confused ? Time: If you observe any of these signs, call 911.       Thank you for coming to see [place] at [facility]. I hope we have been able to provide you high quality care today.  You may receive a patient satisfaction survey over the next few weeks. We would appreciate your feedback and comments so that we may continue to improve ourselves and the health of our patients.

## 2023-08-23 NOTE — Progress Notes (Signed)
[facility] [address] [place]. [place] [address] [phone]       OFFICE FOLLOW UP NOTE  [name] Date of Birth:  [birth year] Medical Record Number:  [id number]    Primary neurologist: Dr. Pearlean Brownie Reason for visit: stroke and vertigo follow up    SUBJECTIVE:   HPI:   Update 08/23/2023 JM: Patient returns for 63-month follow-up visit accompanied by his wife. Denies new stroke/TIA symptoms.  Compliant on Plavix and Crestor. Blood pressure today slightly elevated but has not yet taken BP medications.  Routinely follows with PCP for stroke risk factor management. Continues to have occasional episodes of vertigo, will use meclizine as needed with benefit. Was never called to start vestibular therapy at prior visit but would be interested in participating. He does have occasional dizziness when going from sitting to standing too quickly or when bending over and standing up quickly. Feels this is different from his vertigo.  No further questions or concerns at this time.    History provided for reference purposes only Update 02/15/2023 Dr. Pearlean Brownie: Patient is seen for follow-up today after last visit with Shanda Bumps nurse practitioner more than a year ago.  Patient states he was seen in the ER on 11/18/2022 and developed sudden onset of, right leg weakness, Palpitations.  His vision also became blurry and he describes it likely from moving.  Upon arrival to the ED was found to have right lower extremity drift and sensory loss involving the right face arm and leg blurred vision was improving.  Patient was evaluated by teleneurology and noted to have only mild right-sided deficits.  Not severe enough to justify thrombolysis noncontrast CT head was negative.  CT angiogram of the head and occlusion.  CT perfusion was inconclusive and showed 16 mm perfusion deficit which was in multiple vascular territories and did not localize to his presenting symptoms.  MRI scan of the brain  was negative for acute stroke and showed multiple old lacunar LDL cholesterol was 29 mg percent.  Hemoglobin A1c was 5.9.  Echocardiogram showed ejection fraction of 60 to 65% without cardiac source of embolism.  Patient is currently wearing a 2-week Zio patch to look for paroxysmal A-fib.  Patient states is done well since discharge.  Has had no recurrent TIA or stroke symptoms.  He remains on Plavix which is tolerating well with minor bruising and no bleeding.  Blood pressure is under good control.  He is tolerating Crestor well without muscle aches or pains.  He continues to have intermittent dizziness which he describes as mostly positional when he makes a sudden movement or gets up quickly particularly to the left.  He has never been seen by physical therapy and vestibular stabilization exercises.  He does have decreased hearing bilaterally.  He has never been formally diagnosed with benign paroxysmal positional vertigo.  Update 12/16/2021 JM: Patient returns for 24-month stroke follow-up accompanied by his wife, [name].  Overall stable without new or reoccurring stroke/TIA symptoms.  Chronic gait impairment stable, no use of AD, no recent falls.  Reports continued intermittent vertigo, stable since prior visit, occasional use of meclizine with benefit managed by PCP.  Compliant on Plavix and simvastatin, denies side effects.  Blood pressure today 142/88 - has not yet taken morning medications.  He has follow-up visit scheduled tomorrow with PCP Dr. Mayford Knife.  No further concerns at this time.  Initial visit 06/10/2021 JM: Mr. [name] is being seen for hospital follow-up accompanied by his wife, Austin Bruce.  Overall doing well.  Denies new or reoccurring stroke/TIA symptoms.  Chronic gait impairment with imbalance since prior stroke - denies any worsening. Occasional baseline vertigo with use of meclizine with benefit (managed by PCP).  He has tried to return back to prior activities such as yard work and home  improvements (such as painting) but his kids will not allow him to do so.  Also worsening lower back pain limiting - plans to schedule f/u with neuro surg.   Remains on Plavix and simvastatin without side effects.  Blood pressure today 145/81. Routinely monitors - does fluctuate but has been modifying diet and avoiding high sodium foods. 30-day cardiac event monitor completed but only worn for 9 days but did not show evidence of atrial fibrillation.  Has appt with PCP in Nov for physical and lab work.  No further concerns at this time.   Stroke admission 03/28/2021 [name] is a 77 y.o. male with history of Htn, Hld and a previous cerebellar stroke who presented on 03/28/2021 after an intense bout of vertigo and lightheadedness associated with bilateral numbness, right > left.  Personally reviewed hospitalization pertinent progress notes, lab work and imaging.  Evaluated by Dr. Roda Shutters.  Episode of vertigo likely possible BPPV and referred to outpatient vestibular PT.  Incidental finding of small acute/subacute white matter infarct in the left frontal lobe, MCA/ACA watershed area likely secondary to small vessel disease but cannot rule out cardioembolic source.  Evidence of chronic lacunar infarcts in the right thalamic and PLIC as well as chronic microhemorrhages likely secondary to chronic hypertension on MRI.  CTA head/neck no significant stenosis.  EF 60 to 65%.  Recommended 30-day cardiac event monitor to rule out A. fib.  LDL 50.  A1c 6.1 (no hx/dx of DM).  Initiated Plavix 75 mg daily - ASA not recommended due to history of LGIB on ASA.  Resumed home Lozol and Inderal for HTN as well as Zocor for HLD.  PT/OT recommended outpatient therapies and discharged home.   PERTINENT IMAGING  CT HEAD 03/28/2021 IMPRESSION: 1. No evidence of acute intracranial abnormality. 2. Severe chronic small vessel ischemic disease. 3. Small chronic cerebellar infarct.  MR BRAIN 03/28/2021 MRA  HEAD/NECK IMPRESSION: 1. Small acute or subacute white matter infarct in the left frontal lobe. 2. Severe chronic small vessel ischemic disease with chronic lacunar infarcts in the cerebrum and cerebellum. 3. No major arterial occlusion or convincing flow limiting proximal stenosis in the head and neck within limitations of artifact as detailed above.  2D ECHO  IMPRESSIONS   1. Left ventricular ejection fraction, by estimation, is 60 to 65%. The  left ventricle has normal function. The left ventricle has no regional  wall motion abnormalities. Left ventricular diastolic parameters were  normal.   2. Right ventricular systolic function is normal. The right ventricular  size is normal. Tricuspid regurgitation signal is inadequate for assessing  PA pressure.   3. The mitral valve is grossly normal. Trivial mitral valve  regurgitation.   4. The aortic valve is tricuspid. Aortic valve regurgitation is not  visualized. Mild aortic valve sclerosis is present, with no evidence of  aortic valve stenosis. Aortic valve mean gradient measures 5.0 mmHg.  CARDIAC MONITOR Per report by Dr. Nona Dell: 9 days analyzed.  Predominant rhythm is sinus with IVCD, heart rate ranging from 53 bpm up to 101 bpm and average heart rate 64 bpm.  No atrial fibrillation was noted.  There were no pauses.  ROS:   14 system review of systems performed and negative with exception of those listed in HPI  PMH:  Past Medical History:  Diagnosis Date   Aphakia, right eye 07/02/2021   Onset post cataract surgery with dislocation of IOL 07/01/2021  Vitrectomy, insertion three-piece IOL sulcus 07/03/2021   Choroidal detachment, serous, right eye 08/12/2021   Late onset post vitrectomy for dislocated IOL, retained lens fragments and IOL repositioning  08/19/2021, resolved choroidal detachments post topical therapy with atropine   High cholesterol    Hypertension    OSA (obstructive sleep apnea)     Intolerant to CPAP   Stroke (HCC)     PSH:  Past Surgical History:  Procedure Laterality Date   CATARACT EXTRACTION Right 07/01/2021   Dislocated IOL   HAND SURGERY     HERNIA REPAIR     NOSE SURGERY     TONSILLECTOMY      Social History:  Social History   Socioeconomic History   Marital status: Married    Spouse name: Not on file   Number of children: Not on file   Years of education: Not on file   Highest education level: Not on file  Occupational History   Not on file  Tobacco Use   Smoking status: Former    Current packs/day: 0.00    Average packs/day: 3.0 packs/day for 31.0 years (93.0 ttl pk-yrs)    Types: Cigarettes    Start date: 09/06/1956    Quit date: 09/07/1987    Years since quitting: 35.9   Smokeless tobacco: Never   Tobacco comments:    Pt reports that at the most he smoked 3ppd  Vaping Use   Vaping status: Never Used  Substance and Sexual Activity   Alcohol use: Yes    Comment: occ   Drug use: Never   Sexual activity: Not on file  Other Topics Concern   Not on file  Social History Narrative   Lives with wife    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  Food Insecurity: No Food Insecurity (11/19/2022)   Hunger Vital Sign    Worried About Running Out of Food in the Last Year: Never true    Ran Out of Food in the Last Year: Never true  Transportation Needs: No Transportation Needs (11/19/2022)   PRAPARE - Administrator, Civil Service (Medical): No    Lack of Transportation (Non-Medical): No  Physical Activity: Not on file  Stress: Not on file  Social Connections: Not on file  Intimate Partner Violence: Not At Risk (11/19/2022)   Humiliation, Afraid, Rape, and Kick questionnaire    Fear of Current or Ex-Partner: No    Emotionally Abused: No    Physically Abused: No    Sexually Abused: No    Family History:  Family History  Problem Relation Age of Onset   Stroke Neg Hx     Medications:    Current Outpatient Medications on File Prior to Visit  Medication Sig Dispense Refill   amLODipine (NORVASC) 10 MG tablet Take 10 mg by mouth daily.     clopidogrel (PLAVIX) 75 MG tablet Take 75 mg by mouth daily.     HYDROcodone-acetaminophen (NORCO/VICODIN) 5-325 MG tablet Take 1 tablet by mouth 2 (two) times daily as needed for moderate pain (pain score 4-6). 10 tablet 0   indapamide (LOZOL) 2.5 MG tablet Take 2.5 mg by mouth daily.  levothyroxine (SYNTHROID) 25 MCG tablet Take 25 mcg by mouth daily.     lisinopril (ZESTRIL) 40 MG tablet Take 40 mg by mouth daily.     loratadine (CLARITIN) 10 MG tablet Take 10 mg by mouth daily.     meclizine (ANTIVERT) 12.5 MG tablet Take 12.5 mg by mouth 3 (three) times daily as needed for nausea.     metoprolol succinate (TOPROL XL) 25 MG 24 hr tablet Take 0.5 tablets (12.5 mg total) by mouth at bedtime. 45 tablet 3   Omega-3 Fatty Acids (FISH OIL) 1200 MG CAPS Take 1 capsule by mouth daily.     OXYGEN Inhale 2 L into the lungs every evening.     Potassium Chloride ER 20 MEQ TBCR Take by mouth.  Take 40 mEq by mouth     RESTASIS 0.05 % ophthalmic emulsion Apply to eye.     rosuvastatin (CRESTOR) 20 MG tablet Take 1 tablet (20 mg total) by mouth daily. 30 tablet 5   tamsulosin (FLOMAX) 0.4 MG CAPS capsule Take 0.4 mg by mouth daily.     tamsulosin (FLOMAX) 0.4 MG CAPS capsule Take 1 capsule (0.4 mg total) by mouth daily. 14 capsule 0   No current facility-administered medications on file prior to visit.    Allergies:   Allergies  Allergen Reactions   Penicillins Anaphylaxis      OBJECTIVE:  Physical Exam  Vitals:   08/23/23 1032  BP: (!) 140/79  Pulse: 73  Weight: 219 lb (99.3 kg)  Height: 5\' 9"  (1.753 m)   Body mass index is 32.34 kg/m. No results found.  General: well developed, well nourished, very pleasant elderly Caucasian male, seated, in no evident distress  Neurologic Exam Mental Status: Awake and fully alert.   Fluent speech and language.  Oriented to place and time. Recent and remote memory intact. Attention span, concentration and fund of knowledge appropriate. Mood and affect appropriate.  Cranial Nerves: OS Pupils briskly reactive to light. OD Irregular pupil (chronic). extraocular movements full without nystagmus. Visual fields full to confrontation. Hearing intact. Facial sensation intact. Face, tongue, palate moves normally and symmetrically.  Motor: Normal bulk and tone. Normal strength in all tested extremity muscles Sensory.: intact to touch , pinprick , position and vibratory sensation.  Coordination: Rapid alternating movements normal in all extremities. Finger-to-nose and heel-to-shin performed accurately bilaterally. Gait and Station: Arises from chair without difficulty. Stance is normal. Gait demonstrates normal stride length and mild imbalance without use of assistive device.  Unable to complete tandem walk and heel toe.  Reflexes: 1+ and symmetric. Toes downgoing.         ASSESSMENT: Austin Bruce is a 77 y.o. year old male with episode of possible BPPV and incidental finding (associated with presenting symptoms) of small acute/subacute laminar infarct in the left frontal lobe and MCA/ACA watershed area likely secondary to small vessel disease but cannot rule out cardioembolic source on 03/28/2021 after presenting with intense bout of vertigo and lightheadedness associated with bilateral numbness.  Transient episode 11/2022 of RLE weakness and right face and leg sensory loss and blurred vision possibly in setting of TIA.  Reported intermittent bouts of dizziness at prior visit in 02/2023 likely in setting of BPPV     PLAN:  Possible BPPV:  Chronic history of vertigo - occurs intermittently Referral placed to [facility] for vestibular rehab Use of meclizine as needed per PCP.    Stroke, incidental finding (as above):  Hx of cerebellar stroke:  continue  clopidogrel 75 mg daily  and  simvastatin 40 mg daily for secondary stroke prevention.   Cardiac event monitor negative for atrial fibrillation Discussed secondary stroke prevention measures and importance of close PCP follow up for aggressive stroke risk factor management including BP goal<130/90, and HLD with LDL goal<70  I have gone over the pathophysiology of stroke, warning signs and symptoms, risk factors and their management in some detail with instructions to go to the closest emergency room for symptoms of concern.     Overall stable from neurological standpoint at this time without further recommendations.  Can return back to PCP at this time.  He was advised to call in the future with any questions or concerns    CC:  PCP: [facility]    I spent 43 minutes of face-to-face and non-face-to-face time with patient and wife.  This included previsit chart review, lab review, study review, order entry, electronic health record documentation, patient and wife education and discussion regarding above diagnoses and treatment plan and answered all other questions to patient and wife's satisfaction   Ihor Austin, AGNP-BC  [facility] [address] [place], [place] [address]  Phone [phone] Fax [phone] Note: This document was prepared with digital dictation and possible smart phrase technology. Any transcriptional errors that result from this process are unintentional.

## 2023-08-24 ENCOUNTER — Encounter: Payer: Self-pay | Admitting: Cardiology

## 2023-08-26 DIAGNOSIS — E039 Hypothyroidism, unspecified: Secondary | ICD-10-CM | POA: Diagnosis not present

## 2023-08-26 DIAGNOSIS — E559 Vitamin D deficiency, unspecified: Secondary | ICD-10-CM | POA: Diagnosis not present

## 2023-08-26 DIAGNOSIS — E7849 Other hyperlipidemia: Secondary | ICD-10-CM | POA: Diagnosis not present

## 2023-08-26 DIAGNOSIS — Z8673 Personal history of transient ischemic attack (TIA), and cerebral infarction without residual deficits: Secondary | ICD-10-CM | POA: Diagnosis not present

## 2023-08-26 DIAGNOSIS — E876 Hypokalemia: Secondary | ICD-10-CM | POA: Diagnosis not present

## 2023-08-26 DIAGNOSIS — R739 Hyperglycemia, unspecified: Secondary | ICD-10-CM | POA: Diagnosis not present

## 2023-09-02 DIAGNOSIS — Z23 Encounter for immunization: Secondary | ICD-10-CM | POA: Diagnosis not present

## 2023-09-02 DIAGNOSIS — Z0001 Encounter for general adult medical examination with abnormal findings: Secondary | ICD-10-CM | POA: Diagnosis not present

## 2023-09-02 DIAGNOSIS — E7800 Pure hypercholesterolemia, unspecified: Secondary | ICD-10-CM | POA: Diagnosis not present

## 2023-09-02 DIAGNOSIS — I10 Essential (primary) hypertension: Secondary | ICD-10-CM | POA: Diagnosis not present

## 2023-09-02 DIAGNOSIS — R42 Dizziness and giddiness: Secondary | ICD-10-CM | POA: Diagnosis not present

## 2023-09-02 DIAGNOSIS — R7303 Prediabetes: Secondary | ICD-10-CM | POA: Diagnosis not present

## 2023-09-02 DIAGNOSIS — E039 Hypothyroidism, unspecified: Secondary | ICD-10-CM | POA: Diagnosis not present

## 2023-09-02 DIAGNOSIS — H6121 Impacted cerumen, right ear: Secondary | ICD-10-CM | POA: Diagnosis not present

## 2023-09-02 DIAGNOSIS — G8929 Other chronic pain: Secondary | ICD-10-CM | POA: Diagnosis not present

## 2023-09-02 DIAGNOSIS — Z8673 Personal history of transient ischemic attack (TIA), and cerebral infarction without residual deficits: Secondary | ICD-10-CM | POA: Diagnosis not present

## 2023-09-06 ENCOUNTER — Ambulatory Visit (HOSPITAL_COMMUNITY): Payer: Medicare Other

## 2023-09-06 ENCOUNTER — Telehealth (HOSPITAL_COMMUNITY): Payer: Self-pay

## 2023-09-06 DIAGNOSIS — E782 Mixed hyperlipidemia: Secondary | ICD-10-CM | POA: Diagnosis not present

## 2023-09-06 DIAGNOSIS — I10 Essential (primary) hypertension: Secondary | ICD-10-CM | POA: Diagnosis not present

## 2023-09-06 DIAGNOSIS — E039 Hypothyroidism, unspecified: Secondary | ICD-10-CM | POA: Diagnosis not present

## 2023-09-06 DIAGNOSIS — G8929 Other chronic pain: Secondary | ICD-10-CM | POA: Diagnosis not present

## 2023-09-06 NOTE — Telephone Encounter (Signed)
 Left message on voicemail regarding missed evaluation appt today.  Requested callback.    12:13 PM, 09/06/23 Amaree Leeper Small Edger Husain MPT [facility] [place] [phone]

## 2023-09-13 DIAGNOSIS — G4733 Obstructive sleep apnea (adult) (pediatric): Secondary | ICD-10-CM | POA: Diagnosis not present

## 2023-09-14 DIAGNOSIS — R030 Elevated blood-pressure reading, without diagnosis of hypertension: Secondary | ICD-10-CM | POA: Diagnosis not present

## 2023-09-14 DIAGNOSIS — J40 Bronchitis, not specified as acute or chronic: Secondary | ICD-10-CM | POA: Diagnosis not present

## 2023-09-14 DIAGNOSIS — J329 Chronic sinusitis, unspecified: Secondary | ICD-10-CM | POA: Diagnosis not present

## 2023-09-23 ENCOUNTER — Encounter (HOSPITAL_COMMUNITY): Payer: Medicare Other

## 2023-09-23 ENCOUNTER — Telehealth (HOSPITAL_COMMUNITY): Payer: Self-pay

## 2023-09-23 NOTE — Telephone Encounter (Signed)
Called patient to follow up with his appointment today for an initial evaluation as patient did not show up. Patient forgot that he has an appointment today. Patient's call was transferred to the front desk staff to be rescheduled.  Tish Frederickson. Kennedie Pardoe, PT, DPT, OCS Board-Certified Clinical Specialist in Orthopedic PT PT Compact Privilege # ([place]): [id number] T

## 2023-09-30 ENCOUNTER — Other Ambulatory Visit: Payer: Self-pay

## 2023-09-30 ENCOUNTER — Ambulatory Visit (HOSPITAL_COMMUNITY): Payer: Medicare Other

## 2023-09-30 DIAGNOSIS — R262 Difficulty in walking, not elsewhere classified: Secondary | ICD-10-CM | POA: Diagnosis not present

## 2023-09-30 DIAGNOSIS — R42 Dizziness and giddiness: Secondary | ICD-10-CM | POA: Diagnosis not present

## 2023-09-30 DIAGNOSIS — H8112 Benign paroxysmal vertigo, left ear: Secondary | ICD-10-CM | POA: Diagnosis not present

## 2023-09-30 NOTE — Therapy (Signed)
OUTPATIENT PHYSICAL THERAPY VESTIBULAR EVALUATION     Patient Name: Austin Bruce MRN: [id number] DOB:1946-06-10, 78 y.o., male Today's Date: 09/30/2023  END OF SESSION:  PT End of Session - 09/30/23 1533     Visit Number 1    Number of Visits 5    Date for PT Re-Evaluation 10/28/23    Authorization Type [employer] Medicare (requested 4 visits)    PT Start Time 1430    PT Stop Time 1520    PT Time Calculation (min) 50 min    Equipment Utilized During Treatment Gait belt    Activity Tolerance Patient tolerated treatment well    Behavior During Therapy WFL for tasks assessed/performed             Past Medical History:  Diagnosis Date   Aphakia, right eye 07/02/2021   Onset post cataract surgery with dislocation of IOL 07/01/2021  Vitrectomy, insertion three-piece IOL sulcus 07/03/2021   Choroidal detachment, serous, right eye 08/12/2021   Late onset post vitrectomy for dislocated IOL, retained lens fragments and IOL repositioning  08/19/2021, resolved choroidal detachments post topical therapy with atropine   High cholesterol    Hypertension    OSA (obstructive sleep apnea)    Intolerant to CPAP   Stroke [facility])    Past Surgical History:  Procedure Laterality Date   CATARACT EXTRACTION Right 07/01/2021   Dislocated IOL   HAND SURGERY     HERNIA REPAIR     NOSE SURGERY     TONSILLECTOMY     Patient Active Problem List   Diagnosis Date Noted   Right leg weakness 11/18/2022   Hyponatremia 11/18/2022   Weakness of right lower extremity 11/18/2022   Age-related nuclear cataract, left 04/13/2022   Nocturnal hypoxia 12/03/2021   Pseudophakia of right eye 10/14/2021   Snores 08/12/2021   Anisocoria 07/08/2021   Dislocated IOL (intraocular lens), posterior, right 07/02/2021   Cataract (lens) fragments in eye following cataract surgery, right eye 07/02/2021   Vitreomacular adhesion of right eye 07/02/2021   BPPV (benign paroxysmal positional vertigo) 03/29/2021   Acute CVA  (cerebrovascular accident) (HCC) 03/28/2021   History of CVA (cerebrovascular accident) 03/28/2021   Former smoker 03/28/2021   Hypertension    High cholesterol    Vertigo    Bilateral leg numbness    History of GI bleed    Hypokalemia    CKD stage G3b/A1, GFR 30-44 and albumin creatinine ratio <30 mg/g (HCC)     PCP: [facility] REFERRING PROVIDER: Ihor Austin, NP  REFERRING DIAG: H81.12 (ICD-10-CM) - Benign paroxysmal positional vertigo of left ear  THERAPY DIAG:  Dizziness and giddiness  Difficulty in walking, not elsewhere classified  ONSET DATE: when he had 4 episode of strokes (1st episode of 8 years ago, latest was March 2024)  Rationale for Evaluation and Treatment: Rehabilitation  SUBJECTIVE:   SUBJECTIVE STATEMENT: EVAL: Arrives to the clinic with c/o dizziness. Bending over and coming back up as well as laying down/sitting back up makes him dizzy. Dizziness is described as being lightheaded which can last for 5". Dizziness is also described as "swimmy headed". Patient feels wobbly when he walks. At times, the legs can feel numb.  Denies dizziness when laying on his back or sides. Patient denies dizziness on busy environment. Condition started when he had 4 episode of strokes (1st episode of 8 years ago, latest was March 2024). Never had PT when he had stroke. Patient reports that his MD had done some tests  on his ear and was told that he has some water on his R ear drum. Patient takes vertigo pills for his dizziness and it's helping so far. Patient was then referred to outpatient PT evaluation and management.  Pt accompanied by: self  PERTINENT HISTORY: 4 episodes of stroke, L cataract surgery  PAIN:  Are you having pain? No  PRECAUTIONS: Fall and Other: hard of hearing  RED FLAGS: None   WEIGHT BEARING RESTRICTIONS: No  FALLS: Has patient fallen in last 6 months? No  LIVING ENVIRONMENT: Lives with: lives with their spouse Lives in:  House/apartment Stairs: Yes: External: 3 steps; on left going up Has following equipment at home: Single point cane and standard walker  PLOF: Independent and Independent with basic ADLs  PATIENT GOALS: "to get the dizziness away"  OBJECTIVE:  Note: Objective measures were completed at Evaluation unless otherwise noted.  DIAGNOSTIC FINDINGS: None performed relevant to the area/complaint recently   COGNITION: Overall cognitive status: Within functional limits for tasks assessed   SENSATION: Not tested  POSTURE:  rounded shoulders and forward head  Cervical ROM:    Active A/PROM (deg) eval  Flexion WFL  Extension WFL  Right lateral flexion   Left lateral flexion   Right rotation WFL  Left rotation WFL  (Blank rows = not tested)   LOWER EXTREMITY MMT:   MMT Right eval Left eval  Hip flexion 4+ 4+  Hip abduction 4+ 4+  Hip adduction 4+ 4+  Hip internal rotation    Hip external rotation    Knee flexion 4+ 4+  Knee extension 4+ 4+  Ankle dorsiflexion 4+ 4+  Ankle plantarflexion 4+ 4+  Ankle inversion    Ankle eversion    (Blank rows = not tested)  BED MOBILITY:  Sit to supine SBA Supine to sit SBA  TRANSFERS: Assistive device utilized: None  Sit to stand: Complete Independence Stand to sit: Complete Independence  GAIT: Gait pattern: decreased arm swing- Right, decreased arm swing- Left, decreased step length- Right, decreased step length- Left, decreased stride length, decreased hip/knee flexion- Right, decreased hip/knee flexion- Left, decreased trunk rotation, trunk flexed, and wide BOS Distance walked: 310 ft Assistive device utilized: None Level of assistance: Complete Independence Comments: done during  FUNCTIONAL TESTS:  2 minute walk test: 310 ft Dynamic Gait Index: 11  DGI 09/30/23 1. Gait level surface (2) Mild Impairment: Walks 20', uses assistive devices, slower speed, mild gait deviations. 2. Change in gait speed (2) Mild  Impairment: Is able to change speed but demonstrates mild gait deviations, or not gait deviations but unable to achieve a significant change in velocity, or uses an assistive device. 3. Gait with horizontal head turns (1) Moderate Impairment: Performs head turns with moderate change in gait velocity, slows down, staggers but recovers, can continue to walk. 4. Gait with vertical head turns 1) Moderate Impairment: Performs head turns with moderate change in gait velocity, slows down, staggers but recovers, can continue to walk. 5. Gait and pivot turn (2) Mild Impairment: Pivot turns safely in > 3 seconds and stops with no loss of balance. 6. Step over obstacle (0) Severe Impairment: Cannot perform without assistance. 7. Step around obstacles (2) Mild Impairment: Is able to step around both cones, but must slow down and adjust steps to clear cones. 8. Stairs (1) Moderate Impairment: Two feet to a stair, must use rail.  TOTAL SCORE: 11 / 24   PATIENT SURVEYS:  DHI: 6 / 100 P: 4 / 28  E: 2 / 36 F: 0 / 36  VESTIBULAR ASSESSMENT:  GENERAL OBSERVATION: ambulatory without assistive device   SYMPTOM BEHAVIOR:  Subjective history: see above  Non-Vestibular symptoms:  none  Type of dizziness: Lightheadedness/Faint and "Swimmyheaded"  Frequency: see above  Duration: see above  Aggravating factors:  see above  Relieving factors:  medications (Meclizine)  Progression of symptoms: unchanged  OCULOMOTOR EXAM:  Ocular Alignment: normal  Ocular ROM: No Limitations  Spontaneous Nystagmus: absent  Gaze-Induced Nystagmus: absent  Smooth Pursuits: intact  Saccades: intact  VESTIBULAR - OCULAR REFLEX:   Slow VOR: Comment: to be determined  VOR Cancellation: Comment: to be determined  Head-Impulse Test: HIT Right: negative HIT Left: negative   POSITIONAL TESTING: Right Dix-Hallpike: no nystagmus and no vertigo/dizziness reported Left Dix-Hallpike: no nystagmus and no vertigo/dizziness  reported Right Roll Test: no nystagmus and no vertigo/dizziness reported Left Roll Test: no nystagmus and no vertigo/dizziness reported  MOTION SENSITIVITY:  Motion Sensitivity Quotient Intensity: 0 = none, 1 = Lightheaded, 2 = Mild, 3 = Moderate, 4 = Severe, 5 = Vomiting  Intensity  1. Sitting to supine 1  2. Supine to L side   3. Supine to R side   4. Supine to sitting 0  5. L Hallpike-Dix 0  6. Up from L  0  7. R Hallpike-Dix 0  8. Up from R  0  9. Sitting, head tipped to L knee 0  10. Head up from L knee 0  11. Sitting, head tipped to R knee 0  12. Head up from R knee 0  13. Sitting head turns x5 0  14.Sitting head nods x5 0  15. In stance, 180 turn to L    16. In stance, 180 turn to R     OTHOSTATICS: Supine: 160/84 with HR 63; Sitting: 144/79 with HR 77; Standing: 135/71 with HR 79                                                                                                                          TREATMENT DATE:  09/30/23 Evaluation and patient education done.  PATIENT EDUCATION: Education details: Educated on the pathoanatomy of dizziness and balance. Educated on the goals and course of rehab. Educated on measures to reduce falls at home Person educated: Patient Education method: Explanation Education comprehension: verbalized understanding  HOME EXERCISE PROGRAM:  GOALS: Goals reviewed with patient? Yes  SHORT TERM GOALS: Target date: 10/14/23  Pt will demonstrate indep in HEP to facilitate carry-over of skilled services and improve functional outcomes Goal status: INITIAL  LONG TERM GOALS: Target date: 10/28/23  Pt will improve DGI by at least 9 points in order to demonstrate clinically significant improvement in balance and decreased risk for falls  Baseline: 11 Goal status: INITIAL  2.  Pt will increase by at least 40 ft in order to demonstrate clinically significant improvement in community ambulation Baseline: 310 ft Goal status:  INITIAL  3.  Pt will  demonstrate a motion sensitivity quotient of 0 in sitting to supine to facilitate ease and safety in ADLs and ambulation. Baseline: 1 Goal status: INITIAL  ASSESSMENT:  CLINICAL IMPRESSION: EVAL: Patient is a 78 y.o. male who was seen today for physical therapy evaluation and treatment for L BPPV. However, patient does not present with s/sx of BPPV based on the objective findings above. Patient's symptoms maybe a result of vestibular hypofunction. Patient's condition is further defined by difficulty with bed mobility and difficulty with walking due to dizziness and impaired balance/proprioception. Skilled PT is required to address the impairments and functional limitations listed below.    OBJECTIVE IMPAIRMENTS: Abnormal gait, decreased activity tolerance, decreased balance, and dizziness.   ACTIVITY LIMITATIONS: carrying, lifting, bending, and bed mobility  PARTICIPATION LIMITATIONS: meal prep, cleaning, laundry, shopping, community activity, and yard work  PERSONAL FACTORS: Age and Time since onset of injury/illness/exacerbation are also affecting patient's functional outcome.   REHAB POTENTIAL: Good  CLINICAL DECISION MAKING: Stable/uncomplicated  EVALUATION COMPLEXITY: Low   PLAN:  PT FREQUENCY: 1x/week  PT DURATION: 4 weeks  PLANNED INTERVENTIONS: 97164- PT Re-evaluation, 97110-Therapeutic exercises, 97530- Therapeutic activity, [id number]- Neuromuscular re-education, 97535- Self Care, [id number]- Manual therapy, [phone]- Gait training, and Patient/Family education  PLAN FOR NEXT SESSION: Provide HEP. Begin habituation and conditioning activities.   Tish Frederickson. Taylin Leder, PT, DPT, OCS Board-Certified Clinical Specialist in Orthopedic PT PT Compact Privilege # ([place]): [phone] T 09/30/2023, 4:54 PM   [facility] [address] Medicare Auth Request Information  Date of referral: 08/23/23 Referring provider: Ihor Austin, NP Referring diagnosis (ICD 10)? H81.12 Treatment diagnosis  (ICD 10)? (if different than referring diagnosis) R42, R26.2  Functional Tool Score: Dizziness Handicap Inventory: 6 / 100 P: 4 / 28 E: 2 / 36 F: 0 / 36  What was this (referring dx) caused by? Other: 4 episodes of stroke  Nature of Condition: Chronic (continuous duration > 3 months)   Laterality: Both  Current Functional Measure Score: Other DGI = 11, 2 minute walk test = 310 ft  Objective measurements identify impairments when they are compared to normal values, the uninvolved extremity, and prior level of function.  [x]  Yes  []  No  Objective assessment of functional ability: Minimal functional limitations   Briefly describe symptoms: lightheadedness  How did symptoms start: since he had 4 episodes of stroke  Average pain intensity:  Last 24 hours: 0  Past week: 0  How often does the pt experience symptoms? Frequently  How much have the symptoms interfered with usual daily activities? A little bit  How has condition changed since care began at this facility? NA - initial visit  In general, how is the patients overall health? Fair   BACK PAIN (STarT Back Screening Tool) No

## 2023-10-05 ENCOUNTER — Ambulatory Visit (HOSPITAL_COMMUNITY): Payer: Medicare Other | Admitting: Physical Therapy

## 2023-10-05 DIAGNOSIS — R262 Difficulty in walking, not elsewhere classified: Secondary | ICD-10-CM

## 2023-10-05 DIAGNOSIS — H8112 Benign paroxysmal vertigo, left ear: Secondary | ICD-10-CM | POA: Diagnosis not present

## 2023-10-05 DIAGNOSIS — R42 Dizziness and giddiness: Secondary | ICD-10-CM

## 2023-10-05 NOTE — Therapy (Signed)
OUTPATIENT PHYSICAL THERAPY VESTIBULAR Treatment    Patient Name: Austin Bruce MRN: [id number] DOB:05/15/1946, 78 y.o., male Today's Date: 10/05/2023  END OF SESSION:  PT End of Session - 10/05/23 0929     Visit Number 2    Number of Visits 5    Date for PT Re-Evaluation 10/28/23    Authorization Type [facility] Medicare  4 visits)    Authorization Time Period 1/24-2/21    Authorization - Visit Number 1    Authorization - Number of Visits 4    Progress Note Due on Visit 5    PT Start Time 0840    PT Stop Time 0920    PT Time Calculation (min) 40 min    Activity Tolerance Patient tolerated treatment well    Behavior During Therapy [facility] for tasks assessed/performed              Past Medical History:  Diagnosis Date   Aphakia, right eye 07/02/2021   Onset post cataract surgery with dislocation of IOL 07/01/2021  Vitrectomy, insertion three-piece IOL sulcus 07/03/2021   Choroidal detachment, serous, right eye 08/12/2021   Late onset post vitrectomy for dislocated IOL, retained lens fragments and IOL repositioning  08/19/2021, resolved choroidal detachments post topical therapy with atropine   High cholesterol    Hypertension    OSA (obstructive sleep apnea)    Intolerant to CPAP   Stroke [facility])    Past Surgical History:  Procedure Laterality Date   CATARACT EXTRACTION Right 07/01/2021   Dislocated IOL   HAND SURGERY     HERNIA REPAIR     NOSE SURGERY     TONSILLECTOMY     Patient Active Problem List   Diagnosis Date Noted   Right leg weakness 11/18/2022   Hyponatremia 11/18/2022   Weakness of right lower extremity 11/18/2022   Age-related nuclear cataract, left 04/13/2022   Nocturnal hypoxia 12/03/2021   Pseudophakia of right eye 10/14/2021   Snores 08/12/2021   Anisocoria 07/08/2021   Dislocated IOL (intraocular lens), posterior, right 07/02/2021   Cataract (lens) fragments in eye following cataract surgery, right eye 07/02/2021   Vitreomacular adhesion of right  eye 07/02/2021   BPPV (benign paroxysmal positional vertigo) 03/29/2021   Acute CVA (cerebrovascular accident) (HCC) 03/28/2021   History of CVA (cerebrovascular accident) 03/28/2021   Former smoker 03/28/2021   Hypertension    High cholesterol    Vertigo    Bilateral leg numbness    History of GI bleed    Hypokalemia    CKD stage G3b/A1, GFR 30-44 and albumin creatinine ratio <30 mg/g (HCC)     PCP: [facility] REFERRING PROVIDER: Ihor Austin, NP  REFERRING DIAG: H81.12 (ICD-10-CM) - Benign paroxysmal positional vertigo of left ear  THERAPY DIAG:  Dizziness and giddiness  Difficulty in walking, not elsewhere classified  ONSET DATE: when he had 4 episode of strokes (1st episode of 8 years ago, latest was March 2024)  Rationale for Evaluation and Treatment: Rehabilitation  SUBJECTIVE:   SUBJECTIVE STATEMENT: PT comes into the department limping stating that his sciatic N is really bothering him.  He has had back surgery in the past but has never been to therapy for his back.  States that his sciatic N flares up about twice a year.  Pt states that he is not dizzy today.  Sunday was the last time that he was dizzy.  States that his dizziness is more related to positional changes and if he sits for a while  after coming supine to sit, or stands for a while after coming sit to stand it is much better.   PERTINENT HISTORY: 4 episodes of stroke, L cataract surgery  PAIN:  Are you having pain? No  PRECAUTIONS: Fall and Other: hard of hearing  RED FLAGS: None   WEIGHT BEARING RESTRICTIONS: No  FALLS: Has patient fallen in last 6 months? No  LIVING ENVIRONMENT: Lives with: lives with their spouse Lives in: House/apartment Stairs: Yes: External: 3 steps; on left going up Has following equipment at home: Single point cane and standard walker  PLOF: Independent and Independent with basic ADLs  PATIENT GOALS: "to get the dizziness away"  OBJECTIVE:  Note:  Objective measures were completed at Evaluation unless otherwise noted.  DIAGNOSTIC FINDINGS: None performed relevant to the area/complaint recently   COGNITION: Overall cognitive status: Within functional limits for tasks assessed   SENSATION: Not tested  POSTURE:  rounded shoulders and forward head  Cervical ROM:    Active A/PROM (deg) eval  Flexion WFL  Extension WFL  Right lateral flexion   Left lateral flexion   Right rotation WFL  Left rotation WFL  (Blank rows = not tested)   LOWER EXTREMITY MMT:   MMT Right eval Left eval  Hip flexion 4+ 4+  Hip abduction 4+ 4+  Hip adduction 4+ 4+  Hip internal rotation    Hip external rotation    Knee flexion 4+ 4+  Knee extension 4+ 4+  Ankle dorsiflexion 4+ 4+  Ankle plantarflexion 4+ 4+  Ankle inversion    Ankle eversion    (Blank rows = not tested)  BED MOBILITY:  Sit to supine SBA Supine to sit SBA  TRANSFERS: Assistive device utilized: None  Sit to stand: Complete Independence Stand to sit: Complete Independence  GAIT: Gait pattern: decreased arm swing- Right, decreased arm swing- Left, decreased step length- Right, decreased step length- Left, decreased stride length, decreased hip/knee flexion- Right, decreased hip/knee flexion- Left, decreased trunk rotation, trunk flexed, and wide BOS Distance walked: 310 ft Assistive device utilized: None Level of assistance: Complete Independence Comments: done during  FUNCTIONAL TESTS:  2 minute walk test: 310 ft Dynamic Gait Index: 11  DGI 09/30/23 1. Gait level surface (2) Mild Impairment: Walks 20', uses assistive devices, slower speed, mild gait deviations. 2. Change in gait speed (2) Mild Impairment: Is able to change speed but demonstrates mild gait deviations, or not gait deviations but unable to achieve a significant change in velocity, or uses an assistive device. 3. Gait with horizontal head turns (1) Moderate Impairment: Performs head turns  with moderate change in gait velocity, slows down, staggers but recovers, can continue to walk. 4. Gait with vertical head turns 1) Moderate Impairment: Performs head turns with moderate change in gait velocity, slows down, staggers but recovers, can continue to walk. 5. Gait and pivot turn (2) Mild Impairment: Pivot turns safely in > 3 seconds and stops with no loss of balance. 6. Step over obstacle (0) Severe Impairment: Cannot perform without assistance. 7. Step around obstacles (2) Mild Impairment: Is able to step around both cones, but must slow down and adjust steps to clear cones. 8. Stairs (1) Moderate Impairment: Two feet to a stair, must use rail.  TOTAL SCORE: 11 / 24   PATIENT SURVEYS:  DHI: 6 / 100 P: 4 / 28 E: 2 / 36 F: 0 / 36  VESTIBULAR ASSESSMENT:  GENERAL OBSERVATION: ambulatory without assistive device   SYMPTOM BEHAVIOR:  Subjective history: see above  Non-Vestibular symptoms:  none  Type of dizziness: Lightheadedness/Faint and "Swimmyheaded"  Frequency: see above  Duration: see above  Aggravating factors:  see above  Relieving factors:  medications (Meclizine)  Progression of symptoms: unchanged  OCULOMOTOR EXAM:  Ocular Alignment: normal  Ocular ROM: No Limitations  Spontaneous Nystagmus: absent  Gaze-Induced Nystagmus: absent  Smooth Pursuits: intact  Saccades: intact  VESTIBULAR - OCULAR REFLEX:   Slow VOR: Comment: to be determined  VOR Cancellation: Comment: to be determined  Head-Impulse Test: HIT Right: negative HIT Left: negative   POSITIONAL TESTING: Right Dix-Hallpike: no nystagmus and no vertigo/dizziness reported Left Dix-Hallpike: no nystagmus and no vertigo/dizziness reported Right Roll Test: no nystagmus and no vertigo/dizziness reported Left Roll Test: no nystagmus and no vertigo/dizziness reported  MOTION SENSITIVITY:  Motion Sensitivity Quotient Intensity: 0 = none, 1 = Lightheaded, 2 = Mild, 3 = Moderate, 4 = Severe, 5 =  Vomiting  Intensity  1. Sitting to supine 1  2. Supine to L side   3. Supine to R side   4. Supine to sitting 0  5. L Hallpike-Dix 0  6. Up from L  0  7. R Hallpike-Dix 0  8. Up from R  0  9. Sitting, head tipped to L knee 0  10. Head up from L knee 0  11. Sitting, head tipped to R knee 0  12. Head up from R knee 0  13. Sitting head turns x5 0  14.Sitting head nods x5 0  15. In stance, 180 turn to L    16. In stance, 180 turn to R     OTHOSTATICS: Supine: 160/84 with HR 63; Sitting: 144/79 with HR 77; Standing: 135/71 with HR 79                                                                                                                          TREATMENT DATE:  10/05/23 Knee to chest x 30" x 3 Active hamstring stretch x 3  Piriformis stretch x 3 Sitting piriformis stretch  Habituation: Supine : Head turns x 5 both directions  no sx Roll to RT x 5 both directionsno sx Roll to Lt x 5 both directionsno sx  Sit to stand x 10 no sx  Nustep x 7:00 running on a beach to see if movement going past increases dizziness    09/30/23 Evaluation and patient education done.  PATIENT EDUCATION: Education details: Educated on the pathoanatomy of dizziness and balance. Educated on the goals and course of rehab. Educated on measures to reduce falls at home Person educated: Patient Education method: Explanation Education comprehension: verbalized understanding  HOME EXERCISE PROGRAM: Access Code: [email or web address] URL: [email or web address] Date: 10/05/2023 Prepared by: Virgina Organ  Exercises - Supine Single Knee to Chest Stretch  - 2 x daily - 7 x weekly - 1 sets - 5 reps - 30" hold - Hooklying Active Hamstring Stretch  - 2 x daily - 7 x  weekly - 1 sets - 5 reps - Supine Piriformis Stretch  - 2 x daily - 7 x weekly - 1 sets - 5 reps - Seated Piriformis Stretch  - 3 x daily - 7 x weekly - 1 sets - 5 reps - 30" hold - Sit to Stand  - 3 x daily - 7 x weekly - 1 sets - 5  reps GOALS: Goals reviewed with patient? Yes  SHORT TERM GOALS: Target date: 10/14/23  Pt will demonstrate indep in HEP to facilitate carry-over of skilled services and improve functional outcomes Goal status: IN PROGRESS  LONG TERM GOALS: Target date: 10/28/23  Pt will improve DGI by at least 9 points in order to demonstrate clinically significant improvement in balance and decreased risk for falls  Baseline: 11 Goal status: IN PROGRESS  2.  Pt will increase by at least 40 ft in order to demonstrate clinically significant improvement in community ambulation Baseline: 310 ft Goal status: IN PROGRESS  3.  Pt will demonstrate a motion sensitivity quotient of 0 in sitting to supine to facilitate ease and safety in ADLs and ambulation. Baseline: 1 Goal status: IN PROGRESS  ASSESSMENT:  CLINICAL IMPRESSION:  Pt comes in limping with antalgic gait stating Rt Sciatica is bothering him which is exacerbated by standing.  Tested head turning and rolling is supine without complaint of dizziness.  No dizziness sit to stand.  Pt given HEP to attempt to improve sciatica as we are unable to complete standing activity due to Rt buttock pain at this time.    Patient's symptoms maybe a result of vestibular hypofunction/and/or BP. Pt instructed in proper bed mobility.  Patient's condition is further defined by difficulty with bed mobility and difficulty with walking due to dizziness and impaired balance/proprioception. Skilled PT is required to address the impairments and functional limitations listed below.    OBJECTIVE IMPAIRMENTS: Abnormal gait, decreased activity tolerance, decreased balance, and dizziness.   ACTIVITY LIMITATIONS: carrying, lifting, bending, and bed mobility  PARTICIPATION LIMITATIONS: meal prep, cleaning, laundry, shopping, community activity, and yard work  PERSONAL FACTORS: Age and Time since onset of injury/illness/exacerbation are also affecting patient's functional  outcome.   REHAB POTENTIAL: Good  CLINICAL DECISION MAKING: Stable/uncomplicated  EVALUATION COMPLEXITY: Low   PLAN:  PT FREQUENCY: 1x/week  PT DURATION: 4 weeks  PLANNED INTERVENTIONS: 97164- PT Re-evaluation, 97110-Therapeutic exercises, 97530- Therapeutic activity, [id number]- Neuromuscular re-education, 97535- Self Care, 21308- Manual therapy, [phone]- Gait training, and Patient/Family education  PLAN FOR NEXT SESSION: . Begin Standing habituation and conditioning activities.  Virgina Organ, PT CLT [phone]

## 2023-10-12 ENCOUNTER — Encounter (HOSPITAL_COMMUNITY): Payer: Medicare Other | Admitting: Physical Therapy

## 2023-10-14 DIAGNOSIS — G4733 Obstructive sleep apnea (adult) (pediatric): Secondary | ICD-10-CM | POA: Diagnosis not present

## 2023-10-19 ENCOUNTER — Encounter (HOSPITAL_COMMUNITY): Payer: Medicare Other | Admitting: Physical Therapy

## 2023-10-26 ENCOUNTER — Ambulatory Visit (HOSPITAL_COMMUNITY): Payer: Medicare Other | Admitting: Physical Therapy

## 2023-10-26 DIAGNOSIS — R262 Difficulty in walking, not elsewhere classified: Secondary | ICD-10-CM | POA: Diagnosis not present

## 2023-10-26 DIAGNOSIS — R42 Dizziness and giddiness: Secondary | ICD-10-CM | POA: Insufficient documentation

## 2023-10-26 NOTE — Therapy (Signed)
OUTPATIENT PHYSICAL THERAPY VESTIBULAR Treatment    Patient Name: Austin Bruce MRN: [id number] DOB:12/11/1945, 78 y.o., male Today's Date: 10/26/2023  END OF SESSION:  PT End of Session - 10/26/23 0935     Visit Number 3    Number of Visits 5    Date for PT Re-Evaluation 10/28/23    Authorization Type [facility] Medicare  4 visits)    Authorization Time Period 1/24-2/21    Authorization - Visit Number 2    Authorization - Number of Visits 4    Progress Note Due on Visit 5    PT Start Time 0845    PT Stop Time 0930    PT Time Calculation (min) 45 min    Activity Tolerance Patient tolerated treatment well    Behavior During Therapy [facility] for tasks assessed/performed               Past Medical History:  Diagnosis Date   Aphakia, right eye 07/02/2021   Onset post cataract surgery with dislocation of IOL 07/01/2021  Vitrectomy, insertion three-piece IOL sulcus 07/03/2021   Choroidal detachment, serous, right eye 08/12/2021   Late onset post vitrectomy for dislocated IOL, retained lens fragments and IOL repositioning  08/19/2021, resolved choroidal detachments post topical therapy with atropine   High cholesterol    Hypertension    OSA (obstructive sleep apnea)    Intolerant to CPAP   Stroke [facility])    Past Surgical History:  Procedure Laterality Date   CATARACT EXTRACTION Right 07/01/2021   Dislocated IOL   HAND SURGERY     HERNIA REPAIR     NOSE SURGERY     TONSILLECTOMY     Patient Active Problem List   Diagnosis Date Noted   Right leg weakness 11/18/2022   Hyponatremia 11/18/2022   Weakness of right lower extremity 11/18/2022   Age-related nuclear cataract, left 04/13/2022   Nocturnal hypoxia 12/03/2021   Pseudophakia of right eye 10/14/2021   Snores 08/12/2021   Anisocoria 07/08/2021   Dislocated IOL (intraocular lens), posterior, right 07/02/2021   Cataract (lens) fragments in eye following cataract surgery, right eye 07/02/2021   Vitreomacular adhesion of right  eye 07/02/2021   BPPV (benign paroxysmal positional vertigo) 03/29/2021   Acute CVA (cerebrovascular accident) (HCC) 03/28/2021   History of CVA (cerebrovascular accident) 03/28/2021   Former smoker 03/28/2021   Hypertension    High cholesterol    Vertigo    Bilateral leg numbness    History of GI bleed    Hypokalemia    CKD stage G3b/A1, GFR 30-44 and albumin creatinine ratio <30 mg/g (HCC)     PCP: [facility] REFERRING PROVIDER: Ihor Austin, NP  REFERRING DIAG: H81.12 (ICD-10-CM) - Benign paroxysmal positional vertigo of left ear  THERAPY DIAG:  Dizziness and giddiness  Difficulty in walking, not elsewhere classified  ONSET DATE: when he had 4 episode of strokes (1st episode of 8 years ago, latest was March 2024)  Rationale for Evaluation and Treatment: Rehabilitation  SUBJECTIVE:   SUBJECTIVE STATEMENT: Pt states that he has not been dizzy since Sunday.  States that the dizziness only lasted for a short while.  Pt states that he is not completing his HEP as the MD gave him medication for his dizziness.    PERTINENT HISTORY: 4 episodes of stroke, L cataract surgery  PAIN:  Are you having pain? No  PRECAUTIONS: Fall and Other: hard of hearing  RED FLAGS: None   WEIGHT BEARING RESTRICTIONS: No  FALLS: Has  patient fallen in last 6 months? No  LIVING ENVIRONMENT: Lives with: lives with their spouse Lives in: House/apartment Stairs: Yes: External: 3 steps; on left going up Has following equipment at home: Single point cane and standard walker  PLOF: Independent and Independent with basic ADLs  PATIENT GOALS: "to get the dizziness away"  OBJECTIVE:  Note: Objective measures were completed at Evaluation unless otherwise noted.  DIAGNOSTIC FINDINGS: None performed relevant to the area/complaint recently   COGNITION: Overall cognitive status: Within functional limits for tasks assessed   SENSATION: Not tested  POSTURE:  rounded  shoulders and forward head  Cervical ROM:    Active A/PROM (deg) eval  Flexion WFL  Extension WFL  Right lateral flexion   Left lateral flexion   Right rotation WFL  Left rotation WFL  (Blank rows = not tested)   LOWER EXTREMITY MMT:   MMT Right eval Left eval  Hip flexion 4+ 4+  Hip abduction 4+ 4+  Hip adduction 4+ 4+  Hip internal rotation    Hip external rotation    Knee flexion 4+ 4+  Knee extension 4+ 4+  Ankle dorsiflexion 4+ 4+  Ankle plantarflexion 4+ 4+  Ankle inversion    Ankle eversion    (Blank rows = not tested)  BED MOBILITY:  Sit to supine SBA Supine to sit SBA  TRANSFERS: Assistive device utilized: None  Sit to stand: Complete Independence Stand to sit: Complete Independence  GAIT: Gait pattern: decreased arm swing- Right, decreased arm swing- Left, decreased step length- Right, decreased step length- Left, decreased stride length, decreased hip/knee flexion- Right, decreased hip/knee flexion- Left, decreased trunk rotation, trunk flexed, and wide BOS Distance walked: 310 ft Assistive device utilized: None Level of assistance: Complete Independence Comments: done during  FUNCTIONAL TESTS:  2 minute walk test: 310 ft Dynamic Gait Index: 11  DGI 09/30/23 1. Gait level surface (2) Mild Impairment: Walks 20', uses assistive devices, slower speed, mild gait deviations. 2. Change in gait speed (2) Mild Impairment: Is able to change speed but demonstrates mild gait deviations, or not gait deviations but unable to achieve a significant change in velocity, or uses an assistive device. 3. Gait with horizontal head turns (1) Moderate Impairment: Performs head turns with moderate change in gait velocity, slows down, staggers but recovers, can continue to walk. 4. Gait with vertical head turns 1) Moderate Impairment: Performs head turns with moderate change in gait velocity, slows down, staggers but recovers, can continue to walk. 5. Gait and  pivot turn (2) Mild Impairment: Pivot turns safely in > 3 seconds and stops with no loss of balance. 6. Step over obstacle (0) Severe Impairment: Cannot perform without assistance. 7. Step around obstacles (2) Mild Impairment: Is able to step around both cones, but must slow down and adjust steps to clear cones. 8. Stairs (1) Moderate Impairment: Two feet to a stair, must use rail.  TOTAL SCORE: 11 / 24   PATIENT SURVEYS:  DHI: 6 / 100 P: 4 / 28 E: 2 / 36 F: 0 / 36  VESTIBULAR ASSESSMENT:  GENERAL OBSERVATION: ambulatory without assistive device   SYMPTOM BEHAVIOR:  Subjective history: see above  Non-Vestibular symptoms:  none  Type of dizziness: Lightheadedness/Faint and "Swimmyheaded"  Frequency: see above  Duration: see above  Aggravating factors:  see above  Relieving factors:  medications (Meclizine)  Progression of symptoms: unchanged  OCULOMOTOR EXAM:  Ocular Alignment: normal  Ocular ROM: No Limitations  Spontaneous Nystagmus: absent  Gaze-Induced  Nystagmus: absent  Smooth Pursuits: intact  Saccades: intact  VESTIBULAR - OCULAR REFLEX:   Slow VOR: Comment: to be determined  VOR Cancellation: Comment: to be determined  Head-Impulse Test: HIT Right: negative HIT Left: negative   POSITIONAL TESTING: Right Dix-Hallpike: no nystagmus and no vertigo/dizziness reported Left Dix-Hallpike: no nystagmus and no vertigo/dizziness reported Right Roll Test: no nystagmus and no vertigo/dizziness reported Left Roll Test: no nystagmus and no vertigo/dizziness reported  MOTION SENSITIVITY:  Motion Sensitivity Quotient Intensity: 0 = none, 1 = Lightheaded, 2 = Mild, 3 = Moderate, 4 = Severe, 5 = Vomiting  Intensity  1. Sitting to supine 1  2. Supine to L side   3. Supine to R side   4. Supine to sitting 0  5. L Hallpike-Dix 0  6. Up from L  0  7. R Hallpike-Dix 0  8. Up from R  0  9. Sitting, head tipped to L knee 0  10. Head up from L knee 0  11. Sitting, head  tipped to R knee 0  12. Head up from R knee 0  13. Sitting head turns x5 0  14.Sitting head nods x5 0  15. In stance, 180 turn to L    16. In stance, 180 turn to R     OTHOSTATICS: Supine: 160/84 with HR 63; Sitting: 144/79 with HR 77; Standing: 135/71 with HR 79                                                                                                                          TREATMENT DATE:  10/26/23 Tandem stance x 3 Semi tandem stance with quarter turns Semi tandem stance with quarter head nods NBS with quarter head rotation then nods Sitting VOR  cancellation  Quick movement of head x 10 no dizziness.  Sit to stand x 15  Sitting eyes closed with head nods x 10 Sitting with eyes closed head turns x 10  10/05/23 Knee to chest x 30" x 3 Active hamstring stretch x 3  Piriformis stretch x 3 Sitting piriformis stretch  Habituation: Supine : Head turns x 5 both directions  no sx Roll to RT x 5 both directionsno sx Roll to Lt x 5 both directionsno sx  Sit to stand x 10 no sx  Nustep x 7:00 running on a beach to see if movement going past increases dizziness    09/30/23 Evaluation and patient education done.  PATIENT EDUCATION: Education details: Educated on the pathoanatomy of dizziness and balance. Educated on the goals and course of rehab. Educated on measures to reduce falls at home Person educated: Patient Education method: Explanation Education comprehension: verbalized understanding  HOME EXERCISE PROGRAM: 10/25/23 - Tandem Stance in Corner  - 3 x daily - 7 x weekly - 1 sets - 10 reps - Standing Near Stance in [place] with Eyes Closed  - 3 x daily - 7 x weekly - 1 sets - 10 reps - Tandem Stance  with Eyes Closed in Corner  - 3 x daily - 7 x weekly - 1 sets - 10 reps - Seated Cervical Rotation AROM  - 3 x daily - 7 x weekly - 1 sets - 10 reps - Seated Cervical Extension AROM  - 3 x daily - 7 x weekly - 3 sets - 10 reps Access Code: [email or web address] URL:  [email or web address] Date: 10/05/2023 Prepared by: Virgina Organ  Exercises - Supine Single Knee to Chest Stretch  - 2 x daily - 7 x weekly - 1 sets - 5 reps - 30" hold - Hooklying Active Hamstring Stretch  - 2 x daily - 7 x weekly - 1 sets - 5 reps - Supine Piriformis Stretch  - 2 x daily - 7 x weekly - 1 sets - 5 reps - Seated Piriformis Stretch  - 3 x daily - 7 x weekly - 1 sets - 5 reps - 30" hold - Sit to Stand  - 3 x daily - 7 x weekly - 1 sets - 5 reps GOALS: Goals reviewed with patient? Yes  SHORT TERM GOALS: Target date: 10/14/23  Pt will demonstrate indep in HEP to facilitate carry-over of skilled services and improve functional outcomes Goal status: IN PROGRESS  LONG TERM GOALS: Target date: 10/28/23  Pt will improve DGI by at least 9 points in order to demonstrate clinically significant improvement in balance and decreased risk for falls  Baseline: 11 Goal status: IN PROGRESS  2.  Pt will increase by at least 40 ft in order to demonstrate clinically significant improvement in community ambulation Baseline: 310 ft Goal status: IN PROGRESS  3.  Pt will demonstrate a motion sensitivity quotient of 0 in sitting to supine to facilitate ease and safety in ADLs and ambulation. Baseline: 1 Goal status: IN PROGRESS  ASSESSMENT:  CLINICAL IMPRESSION:  Therapist explained three times throughout pt treatment time why we are giving him the HEP and the importance of completing the HEP.  Therapist added sitting exercises with eyes closed; standing narrow base of support and semitandem stance activity.    OBJECTIVE IMPAIRMENTS: Abnormal gait, decreased activity tolerance, decreased balance, and dizziness.   ACTIVITY LIMITATIONS: carrying, lifting, bending, and bed mobility  PARTICIPATION LIMITATIONS: meal prep, cleaning, laundry, shopping, community activity, and yard work  PERSONAL FACTORS: Age and Time since onset of injury/illness/exacerbation are also  affecting patient's functional outcome.   REHAB POTENTIAL: Good  CLINICAL DECISION MAKING: Stable/uncomplicated  EVALUATION COMPLEXITY: Low   PLAN:  PT FREQUENCY: 1x/week  PT DURATION: 4 weeks  PLANNED INTERVENTIONS: 97164- PT Re-evaluation, 97110-Therapeutic exercises, 97530- Therapeutic activity, [id number]- Neuromuscular re-education, 97535- Self Care, 29562- Manual therapy, [phone]- Gait training, and Patient/Family education  PLAN FOR NEXT SESSION: . Begin Standing habituation and conditioning activities.  Virgina Organ, PT CLT [phone]

## 2023-11-03 ENCOUNTER — Telehealth (HOSPITAL_COMMUNITY): Payer: Self-pay | Admitting: Physical Therapy

## 2023-11-03 ENCOUNTER — Encounter (HOSPITAL_COMMUNITY): Payer: Medicare Other | Admitting: Physical Therapy

## 2023-11-03 DIAGNOSIS — H21261 Iris atrophy (essential) (progressive), right eye: Secondary | ICD-10-CM | POA: Diagnosis not present

## 2023-11-03 DIAGNOSIS — H0102B Squamous blepharitis left eye, upper and lower eyelids: Secondary | ICD-10-CM | POA: Diagnosis not present

## 2023-11-03 DIAGNOSIS — H0102A Squamous blepharitis right eye, upper and lower eyelids: Secondary | ICD-10-CM | POA: Diagnosis not present

## 2023-11-03 DIAGNOSIS — H538 Other visual disturbances: Secondary | ICD-10-CM | POA: Diagnosis not present

## 2023-11-03 DIAGNOSIS — H04123 Dry eye syndrome of bilateral lacrimal glands: Secondary | ICD-10-CM | POA: Diagnosis not present

## 2023-11-03 DIAGNOSIS — Z961 Presence of intraocular lens: Secondary | ICD-10-CM | POA: Diagnosis not present

## 2023-11-03 NOTE — Telephone Encounter (Signed)
 Called and left message due to no show.  Pt  does not have another appointment.  Instructed to call the department if he wishes to reschedule.   Virgina Organ, PT CLT [phone]

## 2023-11-11 DIAGNOSIS — G4733 Obstructive sleep apnea (adult) (pediatric): Secondary | ICD-10-CM | POA: Diagnosis not present

## 2023-11-17 DIAGNOSIS — H0102B Squamous blepharitis left eye, upper and lower eyelids: Secondary | ICD-10-CM | POA: Diagnosis not present

## 2023-11-17 DIAGNOSIS — H04123 Dry eye syndrome of bilateral lacrimal glands: Secondary | ICD-10-CM | POA: Diagnosis not present

## 2023-11-17 DIAGNOSIS — H0102A Squamous blepharitis right eye, upper and lower eyelids: Secondary | ICD-10-CM | POA: Diagnosis not present

## 2023-11-17 DIAGNOSIS — H538 Other visual disturbances: Secondary | ICD-10-CM | POA: Diagnosis not present

## 2023-11-25 DIAGNOSIS — R7303 Prediabetes: Secondary | ICD-10-CM | POA: Diagnosis not present

## 2023-11-25 DIAGNOSIS — I10 Essential (primary) hypertension: Secondary | ICD-10-CM | POA: Diagnosis not present

## 2023-11-25 DIAGNOSIS — E876 Hypokalemia: Secondary | ICD-10-CM | POA: Diagnosis not present

## 2023-12-02 DIAGNOSIS — G4733 Obstructive sleep apnea (adult) (pediatric): Secondary | ICD-10-CM | POA: Diagnosis not present

## 2023-12-02 DIAGNOSIS — E039 Hypothyroidism, unspecified: Secondary | ICD-10-CM | POA: Diagnosis not present

## 2023-12-02 DIAGNOSIS — I10 Essential (primary) hypertension: Secondary | ICD-10-CM | POA: Diagnosis not present

## 2023-12-02 DIAGNOSIS — E7800 Pure hypercholesterolemia, unspecified: Secondary | ICD-10-CM | POA: Diagnosis not present

## 2023-12-05 DIAGNOSIS — R748 Abnormal levels of other serum enzymes: Secondary | ICD-10-CM | POA: Diagnosis not present

## 2023-12-08 ENCOUNTER — Encounter: Payer: Self-pay | Admitting: *Deleted

## 2023-12-19 ENCOUNTER — Encounter: Payer: Self-pay | Admitting: Gastroenterology

## 2023-12-19 NOTE — Progress Notes (Deleted)
 GI Office Note    Referring Provider: Donetta Potts, MD Primary Care Physician:  Donetta Potts, MD  Primary Gastroenterologist:  Chief Complaint   No chief complaint on file.    History of Present Illness   Austin Bruce is a 78 y.o. male presenting today at the request of Dr. Mayford Knife for fatty liver and elevated LFTs.   11/2023: AP 46, ALT 31H, AST 17, Tbili 1.2, A1C 5.7HHgb 16.1, Platelets 249, alb 4.5, cre 1.4H     Cologuard neg 2024     Limited US 11/2023: -normal size liver with diffuse fatty change -simple left lobe of liver cyst measuring 13 mm -normal CBD -renal cysts associated with right kidney largest measruing 6cm.  Medications   Current Outpatient Medications  Medication Sig Dispense Refill   amLODipine (NORVASC) 10 MG tablet Take 10 mg by mouth daily.     clopidogrel (PLAVIX) 75 MG tablet Take 75 mg by mouth daily.     HYDROcodone-acetaminophen (NORCO/VICODIN) 5-325 MG tablet Take 1 tablet by mouth 2 (two) times daily as needed for moderate pain (pain score 4-6). 10 tablet 0   indapamide (LOZOL) 2.5 MG tablet Take 2.5 mg by mouth daily.     levothyroxine (SYNTHROID) 25 MCG tablet Take 25 mcg by mouth daily.     lisinopril (ZESTRIL) 40 MG tablet Take 40 mg by mouth daily.     loratadine (CLARITIN) 10 MG tablet Take 10 mg by mouth daily.     meclizine (ANTIVERT) 12.5 MG tablet Take 12.5 mg by mouth 3 (three) times daily as needed for nausea.     metoprolol succinate (TOPROL XL) 25 MG 24 hr tablet Take 0.5 tablets (12.5 mg total) by mouth at bedtime. 45 tablet 3   Omega-3 Fatty Acids (FISH OIL) 1200 MG CAPS Take 1 capsule by mouth daily.     OXYGEN Inhale 2 L into the lungs every evening.     Potassium Chloride ER 20 MEQ TBCR Take by mouth.  Take 40 mEq by mouth     RESTASIS 0.05 % ophthalmic emulsion Apply to eye.     rosuvastatin (CRESTOR) 20 MG tablet Take 1 tablet (20 mg total) by mouth daily. 30 tablet 5   tamsulosin (FLOMAX) 0.4 MG CAPS  capsule Take 0.4 mg by mouth daily.     tamsulosin (FLOMAX) 0.4 MG CAPS capsule Take 1 capsule (0.4 mg total) by mouth daily. 14 capsule 0   No current facility-administered medications for this visit.    Allergies   Allergies as of 12/20/2023 - Review Complete 08/23/2023  Allergen Reaction Noted   Penicillins Anaphylaxis 03/14/2016    Past Medical History   Past Medical History:  Diagnosis Date   Aphakia, right eye 07/02/2021   Onset post cataract surgery with dislocation of IOL 07/01/2021  Vitrectomy, insertion three-piece IOL sulcus 07/03/2021   Choroidal detachment, serous, right eye 08/12/2021   Late onset post vitrectomy for dislocated IOL, retained lens fragments and IOL repositioning  08/19/2021, resolved choroidal detachments post topical therapy with atropine   High cholesterol    Hypertension    OSA (obstructive sleep apnea)    Intolerant to CPAP   Stroke [facility])     Past Surgical History   Past Surgical History:  Procedure Laterality Date   CATARACT EXTRACTION Right 07/01/2021   Dislocated IOL   HAND SURGERY     HERNIA REPAIR     NOSE SURGERY     TONSILLECTOMY  Past Family History   Family History  Problem Relation Age of Onset   Stroke Neg Hx     Past Social History   Social History   Socioeconomic History   Marital status: Married    Spouse name: Not on file   Number of children: Not on file   Years of education: Not on file   Highest education level: Not on file  Occupational History   Not on file  Tobacco Use   Smoking status: Former    Current packs/day: 0.00    Average packs/day: 3.0 packs/day for 31.0 years (93.0 ttl pk-yrs)    Types: Cigarettes    Start date: 09/06/1956    Quit date: 09/07/1987    Years since quitting: 36.3   Smokeless tobacco: Never   Tobacco comments:    Pt reports that at the most he smoked 3ppd  Vaping Use   Vaping status: Never Used  Substance and Sexual Activity   Alcohol use: Yes    Comment: occ   Drug  use: Never   Sexual activity: Not on file  Other Topics Concern   Not on file  Social History Narrative   Lives with wife    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  Food Insecurity: No Food Insecurity (11/19/2022)   Hunger Vital Sign    Worried About Running Out of Food in the Last Year: Never true    Ran Out of Food in the Last Year: Never true  Transportation Needs: No Transportation Needs (11/19/2022)   PRAPARE - Administrator, Civil Service (Medical): No    Lack of Transportation (Non-Medical): No  Physical Activity: Not on file  Stress: Not on file  Social Connections: Not on file  Intimate Partner Violence: Not At Risk (11/19/2022)   Humiliation, Afraid, Rape, and Kick questionnaire    Fear of Current or Ex-Partner: No    Emotionally Abused: No    Physically Abused: No    Sexually Abused: No    Review of Systems   General: Negative for anorexia, weight loss, fever, chills, fatigue, weakness. Eyes: Negative for vision changes.  ENT: Negative for hoarseness, difficulty swallowing , nasal congestion. CV: Negative for chest pain, angina, palpitations, dyspnea on exertion, peripheral edema.  Respiratory: Negative for dyspnea at rest, dyspnea on exertion, cough, sputum, wheezing.  GI: See history of present illness. GU:  Negative for dysuria, hematuria, urinary incontinence, urinary frequency, nocturnal urination.  MS: Negative for joint pain, low back pain.  Derm: Negative for rash or itching.  Neuro: Negative for weakness, abnormal sensation, seizure, frequent headaches, memory loss,  confusion.  Psych: Negative for anxiety, depression, suicidal ideation, hallucinations.  Endo: Negative for unusual weight change.  Heme: Negative for bruising or bleeding. Allergy: Negative for rash or hives.  Physical Exam   There were no vitals taken for this visit.   General: Well-nourished, well-developed in no acute distress.   Head: Normocephalic, atraumatic.   Eyes: Conjunctiva pink, no icterus. Mouth: Oropharyngeal mucosa moist and pink , no lesions erythema or exudate. Neck: Supple without thyromegaly, masses, or lymphadenopathy.  Lungs: Clear to auscultation bilaterally.  Heart: Regular rate and rhythm, no murmurs rubs or gallops.  Abdomen: Bowel sounds are normal, nontender, nondistended, no hepatosplenomegaly or masses,  no abdominal bruits or hernia, no rebound or guarding.   Rectal: *** Extremities: No lower extremity edema. No clubbing or deformities.  Neuro: Alert and oriented x  4 , grossly normal neurologically.  Skin: Warm and dry, no rash or jaundice.   Psych: Alert and cooperative, normal mood and affect.  Labs   *** Imaging Studies   No results found.  Assessment       PLAN   ***   Trudie Fuse. Harles Lied, MHS, PA-C [facility]

## 2023-12-20 ENCOUNTER — Ambulatory Visit: Admitting: Gastroenterology

## 2024-01-02 ENCOUNTER — Ambulatory Visit: Admitting: Gastroenterology

## 2024-01-02 NOTE — Progress Notes (Deleted)
 GI Office Note    Referring Provider: Lauran Pollard, MD Primary Care Physician:  Lauran Pollard, MD  Primary Gastroenterologist:  Chief Complaint   No chief complaint on file.    History of Present Illness   Austin Bruce is a 78 y.o. male presenting today at the request of Dr. Broadus Canes for fatty liver and elevated LFTs.  osa Prediabetes Htn H/o cva  Mild elevated alt Nasal surg Tonsil Hand Hernia hld Brother thorat Sister ovarian  11/2023: alb 4.5, AP 46, ALT 31H (5-30), AST 17, Tbili 1.2, wbc 8, Hgb 16.1, Platelets 249, A1C 5.7  Ruq u/s 11/2023: normal size liver with diffuse fatty change. Small cyst left lobe measuring 13mm. Normal CBD. Renal cysts associated with right kidney largest measuring 6cm.      Medications   Current Outpatient Medications  Medication Sig Dispense Refill   amLODipine  (NORVASC ) 10 MG tablet Take 10 mg by mouth daily.     clopidogrel  (PLAVIX ) 75 MG tablet Take 75 mg by mouth daily.     HYDROcodone -acetaminophen  (NORCO/VICODIN) 5-325 MG tablet Take 1 tablet by mouth 2 (two) times daily as needed for moderate pain (pain score 4-6). 10 tablet 0   indapamide  (LOZOL ) 2.5 MG tablet Take 2.5 mg by mouth daily.     levothyroxine (SYNTHROID) 25 MCG tablet Take 25 mcg by mouth daily.     lisinopril  (ZESTRIL ) 40 MG tablet Take 40 mg by mouth daily.     loratadine (CLARITIN) 10 MG tablet Take 10 mg by mouth daily.     meclizine (ANTIVERT) 12.5 MG tablet Take 12.5 mg by mouth 3 (three) times daily as needed for nausea.     metoprolol  succinate (TOPROL  XL) 25 MG 24 hr tablet Take 0.5 tablets (12.5 mg total) by mouth at bedtime. 45 tablet 3   Omega-3 Fatty Acids (FISH OIL) 1200 MG CAPS Take 1 capsule by mouth daily.     OXYGEN  Inhale 2 L into the lungs every evening.     Potassium Chloride  ER 20 MEQ TBCR Take by mouth.  Take 40 mEq by mouth     RESTASIS 0.05 % ophthalmic emulsion Apply to eye.     rosuvastatin  (CRESTOR ) 20 MG tablet Take  1 tablet (20 mg total) by mouth daily. 30 tablet 5   tamsulosin  (FLOMAX ) 0.4 MG CAPS capsule Take 0.4 mg by mouth daily.     tamsulosin  (FLOMAX ) 0.4 MG CAPS capsule Take 1 capsule (0.4 mg total) by mouth daily. 14 capsule 0   No current facility-administered medications for this visit.    Allergies   Allergies as of 01/02/2024 - Review Complete 08/23/2023  Allergen Reaction Noted   Penicillins Anaphylaxis 03/14/2016    Past Medical History   Past Medical History:  Diagnosis Date   Aphakia, right eye 07/02/2021   Onset post cataract surgery with dislocation of IOL 07/01/2021  Vitrectomy, insertion three-piece IOL sulcus 07/03/2021   Choroidal detachment, serous, right eye 08/12/2021   Late onset post vitrectomy for dislocated IOL, retained lens fragments and IOL repositioning  08/19/2021, resolved choroidal detachments post topical therapy with atropine    Chronic low back pain    High cholesterol    Hypertension    OSA (obstructive sleep apnea)    Intolerant to CPAP   Stroke [facility])     Past Surgical History   Past Surgical History:  Procedure Laterality Date   CATARACT EXTRACTION Right 07/01/2021   Dislocated IOL   HAND SURGERY  HERNIA REPAIR     NOSE SURGERY     TONSILLECTOMY      Past Family History   Family History  Problem Relation Age of Onset   Ovarian cancer Sister    Throat cancer Brother    Stroke Neg Hx     Past Social History   Social History   Socioeconomic History   Marital status: Married    Spouse name: Not on file   Number of children: Not on file   Years of education: Not on file   Highest education level: Not on file  Occupational History   Not on file  Tobacco Use   Smoking status: Former    Current packs/day: 0.00    Average packs/day: 3.0 packs/day for 31.0 years (93.0 ttl pk-yrs)    Types: Cigarettes    Start date: 09/06/1956    Quit date: 09/07/1987    Years since quitting: 36.3   Smokeless tobacco: Never   Tobacco comments:     Pt reports that at the most he smoked 3ppd  Vaping Use   Vaping status: Never Used  Substance and Sexual Activity   Alcohol use: Yes    Comment: occ   Drug use: Never   Sexual activity: Not on file  Other Topics Concern   Not on file  Social History Narrative   Lives with wife    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  Food Insecurity: No Food Insecurity (11/19/2022)   Hunger Vital Sign    Worried About Running Out of Food in the Last Year: Never true    Ran Out of Food in the Last Year: Never true  Transportation Needs: No Transportation Needs (11/19/2022)   PRAPARE - Administrator, Civil Service (Medical): No    Lack of Transportation (Non-Medical): No  Physical Activity: Not on file  Stress: Not on file  Social Connections: Not on file  Intimate Partner Violence: Not At Risk (11/19/2022)   Humiliation, Afraid, Rape, and Kick questionnaire    Fear of Current or Ex-Partner: No    Emotionally Abused: No    Physically Abused: No    Sexually Abused: No    Review of Systems   General: Negative for anorexia, weight loss, fever, chills, fatigue, weakness. Eyes: Negative for vision changes.  ENT: Negative for hoarseness, difficulty swallowing , nasal congestion. CV: Negative for chest pain, angina, palpitations, dyspnea on exertion, peripheral edema.  Respiratory: Negative for dyspnea at rest, dyspnea on exertion, cough, sputum, wheezing.  GI: See history of present illness. GU:  Negative for dysuria, hematuria, urinary incontinence, urinary frequency, nocturnal urination.  MS: Negative for joint pain, low back pain.  Derm: Negative for rash or itching.  Neuro: Negative for weakness, abnormal sensation, seizure, frequent headaches, memory loss,  confusion.  Psych: Negative for anxiety, depression, suicidal ideation, hallucinations.  Endo: Negative for unusual weight change.  Heme: Negative for bruising or  bleeding. Allergy: Negative for rash or hives.  Physical Exam   There were no vitals taken for this visit.   General: Well-nourished, well-developed in no acute distress.  Head: Normocephalic, atraumatic.   Eyes: Conjunctiva pink, no icterus. Mouth: Oropharyngeal mucosa moist and pink , no lesions erythema or exudate. Neck: Supple without thyromegaly, masses, or lymphadenopathy.  Lungs: Clear to auscultation bilaterally.  Heart: Regular rate and rhythm, no murmurs rubs or gallops.  Abdomen: Bowel sounds are normal, nontender, nondistended, no hepatosplenomegaly or  masses,  no abdominal bruits or hernia, no rebound or guarding.   Rectal: *** Extremities: No lower extremity edema. No clubbing or deformities.  Neuro: Alert and oriented x 4 , grossly normal neurologically.  Skin: Warm and dry, no rash or jaundice.   Psych: Alert and cooperative, normal mood and affect.  Labs   *** Imaging Studies   No results found.  Assessment     NAFLD score indeterminant   Trudie Fuse. Harles Lied, MHS, PA-C [facility]

## 2024-01-04 ENCOUNTER — Encounter: Payer: Self-pay | Admitting: Gastroenterology

## 2024-01-18 DIAGNOSIS — H0102B Squamous blepharitis left eye, upper and lower eyelids: Secondary | ICD-10-CM | POA: Diagnosis not present

## 2024-01-18 DIAGNOSIS — H0102A Squamous blepharitis right eye, upper and lower eyelids: Secondary | ICD-10-CM | POA: Diagnosis not present

## 2024-01-18 DIAGNOSIS — Z961 Presence of intraocular lens: Secondary | ICD-10-CM | POA: Diagnosis not present

## 2024-01-18 DIAGNOSIS — H04123 Dry eye syndrome of bilateral lacrimal glands: Secondary | ICD-10-CM | POA: Diagnosis not present

## 2024-01-18 DIAGNOSIS — H538 Other visual disturbances: Secondary | ICD-10-CM | POA: Diagnosis not present

## 2024-01-20 ENCOUNTER — Encounter: Payer: Self-pay | Admitting: Gastroenterology

## 2024-01-20 ENCOUNTER — Ambulatory Visit: Admitting: Gastroenterology

## 2024-01-20 VITALS — BP 124/84 | HR 69 | Temp 98.4°F | Ht 69.0 in | Wt 213.4 lb

## 2024-01-20 DIAGNOSIS — K760 Fatty (change of) liver, not elsewhere classified: Secondary | ICD-10-CM

## 2024-01-20 DIAGNOSIS — R7989 Other specified abnormal findings of blood chemistry: Secondary | ICD-10-CM

## 2024-01-20 DIAGNOSIS — R7401 Elevation of levels of liver transaminase levels: Secondary | ICD-10-CM

## 2024-01-20 NOTE — Progress Notes (Signed)
 GI Office Note    Referring Provider: Lauran Pollard, MD Primary Care Physician:  Lauran Pollard, MD  Primary Gastroenterologist: Rolando Cliche. Mordechai April, DO   Chief Complaint   Chief Complaint  Patient presents with   fatty liver    Here for fatty liver and elevated liver enzymes. Also has a hernia per pt.     History of Present Illness   Austin Bruce is a 78 y.o. male presenting today at the request of Dr. Broadus Canes for fatty liver and elevated LFTs.   11/2023: alb 4.5, AP 46, ALT 31H (5-30), AST 17, Tbili 1.2, wbc 8, Hgb 16.1, Platelets 249, A1C 5.7   Ruq u/s 11/2023: normal size liver with diffuse fatty change. Small cyst left lobe measuring 13mm. Normal CBD. Renal cysts associated with right kidney largest measuring 6cm.    Today: patient feels well. No abdominal pain. No swelling. No constipation, diarrhea, melena, brbpr. No heartburn. No dysphagia. No unintentional weight loss.   Looking through [facility] site, over past few years he has had intermittent mild elevation of ALT and/or Tbili.  Father died with cirrhosis in his 19s, possibly medication +/- etoh related per patient.   Remote colonoscopy, had internal bleeding post-procedure. Spent a week in the hospital.   Medications   Current Outpatient Medications  Medication Sig Dispense Refill   amLODipine  (NORVASC ) 10 MG tablet Take 10 mg by mouth daily.     clopidogrel  (PLAVIX ) 75 MG tablet Take 75 mg by mouth daily.     ezetimibe (ZETIA) 10 MG tablet Take 10 mg by mouth daily.     indapamide  (LOZOL ) 2.5 MG tablet Take 2.5 mg by mouth daily.     levothyroxine (SYNTHROID) 25 MCG tablet Take 25 mcg by mouth daily.     lisinopril  (ZESTRIL ) 40 MG tablet Take 40 mg by mouth daily.     loratadine (CLARITIN) 10 MG tablet Take 10 mg by mouth daily.     meclizine (ANTIVERT) 12.5 MG tablet Take 12.5 mg by mouth 3 (three) times daily as needed for nausea.     metoprolol  succinate (TOPROL  XL) 25 MG 24 hr tablet Take 0.5  tablets (12.5 mg total) by mouth at bedtime. 45 tablet 3   Omega-3 Fatty Acids (FISH OIL) 1200 MG CAPS Take 1 capsule by mouth daily.     OXYGEN  Inhale 2 L into the lungs every evening.     potassium chloride  SA (KLOR-CON  M) 20 MEQ tablet Take 20 mEq by mouth 2 (two) times daily.     RESTASIS 0.05 % ophthalmic emulsion Apply to eye.     rosuvastatin  (CRESTOR ) 20 MG tablet Take 1 tablet (20 mg total) by mouth daily. 30 tablet 5   tamsulosin  (FLOMAX ) 0.4 MG CAPS capsule Take 0.4 mg by mouth daily.     No current facility-administered medications for this visit.    Allergies   Allergies as of 01/20/2024 - Review Complete 01/20/2024  Allergen Reaction Noted   Penicillins Anaphylaxis 03/14/2016    Past Medical History   Past Medical History:  Diagnosis Date   Aphakia, right eye 07/02/2021   Onset post cataract surgery with dislocation of IOL 07/01/2021  Vitrectomy, insertion three-piece IOL sulcus 07/03/2021   Choroidal detachment, serous, right eye 08/12/2021   Late onset post vitrectomy for dislocated IOL, retained lens fragments and IOL repositioning  08/19/2021, resolved choroidal detachments post topical therapy with atropine    Chronic low back pain    High cholesterol  Hypertension    OSA (obstructive sleep apnea)    Intolerant to CPAP   Prediabetes    Stroke [facility])     Past Surgical History   Past Surgical History:  Procedure Laterality Date   CATARACT EXTRACTION Right 07/01/2021   Dislocated IOL   HAND SURGERY     HERNIA REPAIR     four hernia repairs in the 1990s   NOSE SURGERY     TONSILLECTOMY      Past Family History   Family History  Problem Relation Age of Onset   Cirrhosis Father        died in his 64s   Ovarian cancer Sister    Throat cancer Brother    Stroke Neg Hx    Colon cancer Neg Hx     Past Social History   Social History   Socioeconomic History   Marital status: Married    Spouse name: Not on file   Number of children: Not on  file   Years of education: Not on file   Highest education level: Not on file  Occupational History   Not on file  Tobacco Use   Smoking status: Former    Current packs/day: 0.00    Average packs/day: 3.0 packs/day for 31.0 years (93.0 ttl pk-yrs)    Types: Cigarettes    Start date: 09/06/1956    Quit date: 09/07/1987    Years since quitting: 36.3   Smokeless tobacco: Never   Tobacco comments:    Pt reports that at the most he smoked 3ppd  Vaping Use   Vaping status: Never Used  Substance and Sexual Activity   Alcohol use: Yes    Comment: occ, never regularly   Drug use: Never   Sexual activity: Not on file  Other Topics Concern   Not on file  Social History Narrative   Lives with wife    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  Food Insecurity: No Food Insecurity (11/19/2022)   Hunger Vital Sign    Worried About Running Out of Food in the Last Year: Never true    Ran Out of Food in the Last Year: Never true  Transportation Needs: No Transportation Needs (11/19/2022)   PRAPARE - Administrator, Civil Service (Medical): No    Lack of Transportation (Non-Medical): No  Physical Activity: Not on file  Stress: Not on file  Social Connections: Not on file  Intimate Partner Violence: Not At Risk (11/19/2022)   Humiliation, Afraid, Rape, and Kick questionnaire    Fear of Current or Ex-Partner: No    Emotionally Abused: No    Physically Abused: No    Sexually Abused: No    Review of Systems   General: Negative for anorexia, weight loss, fever, chills, fatigue, weakness. Eyes: Negative for vision changes.  ENT: Negative for hoarseness, difficulty swallowing , nasal congestion. CV: Negative for chest pain, angina, palpitations, dyspnea on exertion, peripheral edema.  Respiratory: Negative for dyspnea at rest, dyspnea on exertion, cough, sputum, wheezing.  GI: See history of present illness. GU:  Negative for dysuria,  hematuria, urinary incontinence, urinary frequency, nocturnal urination.  MS: Negative for joint pain, low back pain.  Derm: Negative for rash or itching.  Neuro: Negative for weakness, abnormal sensation, seizure, frequent headaches, memory loss,  confusion. Balance off since his strokes Psych: Negative for anxiety, depression, suicidal ideation, hallucinations.  Endo: Negative for unusual weight change.  Heme: Negative for bruising or bleeding. Allergy: Negative for rash or hives.  Physical Exam   BP 124/84 (BP Location: Right Arm, Patient Position: Sitting, Cuff Size: Large)   Pulse 69   Temp 98.4 F (36.9 C) (Oral)   Ht 5\' 9"  (1.753 m)   Wt 213 lb 6.4 oz (96.8 kg)   SpO2 95%   BMI 31.51 kg/m    General: Well-nourished, well-developed in no acute distress.  Head: Normocephalic, atraumatic.   Eyes: Conjunctiva pink, no icterus. Mouth: Oropharyngeal mucosa moist and pink  . Neck: Supple without thyromegaly, masses, or lymphadenopathy.  Lungs: Clear to auscultation bilaterally.  Heart: Regular rate and rhythm, no murmurs rubs or gallops.  Abdomen: Bowel sounds are normal, nontender, nondistended, no hepatosplenomegaly or masses,  no abdominal bruits or hernia, no rebound or guarding.  Liver edge palpated just under right rib margin with deep inspiration Rectal: not performed Extremities: No lower extremity edema. No clubbing or deformities.  Neuro: Alert and oriented x 4 , grossly normal neurologically.  Skin: Warm and dry, no rash or jaundice.   Psych: Alert and cooperative, normal mood and affect.  Labs   See hpi  Imaging Studies   No results found.  Assessment/Plan:   Elevated ALT/Tibli/Fatty liver: intermittent mild elevation over the past few years. Recent u/s with fatty liver. Spleen not examined. He has history of hyperlipidemia and obesity, no significant history of alcohol use. He is at risk of MASLD, Metabolic Dysfunction-Associated Steatotic Liver Disease.  Discussed we could further determine if any underlying fibrosis with elastography. NAFLD score indeterminate.FIB-4 cannot be unreliable due to his age.  -u/s with elastography, will see if spleen can be evaluated -consider additional labs to screen for viral hepatitis, iron overload with next labs, await u/s findings -fatty liver management mostly lifestyle modification (low fat/low carb diet), physical activity, control cholesterol, slow weight loss to healthier weight. If significant fibrosis, F2-3, may be a candidate for medication to slow progression or reverse fibrosis.  -return ov in 6 weeks.    Trudie Fuse. Harles Lied, MHS, PA-C [facility]

## 2024-01-20 NOTE — Patient Instructions (Signed)
 We will do a special ultrasound of your liver to determine if any stiffness/fibrosis. This is different then the ultrasound Dr. Broadus Canes completed back in March.  Return to the office in six weeks to discuss results of your ultrasound.

## 2024-01-23 DIAGNOSIS — S20223A Contusion of bilateral back wall of thorax, initial encounter: Secondary | ICD-10-CM | POA: Diagnosis not present

## 2024-01-25 ENCOUNTER — Ambulatory Visit (HOSPITAL_COMMUNITY)
Admission: RE | Admit: 2024-01-25 | Discharge: 2024-01-25 | Disposition: A | Discharge status: Home / Self Care | Source: Ambulatory Visit | Attending: Gastroenterology | Admitting: Gastroenterology

## 2024-01-25 ENCOUNTER — Other Ambulatory Visit: Payer: Self-pay | Admitting: Gastroenterology

## 2024-01-25 DIAGNOSIS — K760 Fatty (change of) liver, not elsewhere classified: Secondary | ICD-10-CM

## 2024-01-25 DIAGNOSIS — R7401 Elevation of levels of liver transaminase levels: Secondary | ICD-10-CM | POA: Insufficient documentation

## 2024-01-25 DIAGNOSIS — R161 Splenomegaly, not elsewhere classified: Secondary | ICD-10-CM | POA: Diagnosis not present

## 2024-01-26 ENCOUNTER — Ambulatory Visit: Payer: Self-pay | Admitting: Gastroenterology

## 2024-01-27 DIAGNOSIS — I10 Essential (primary) hypertension: Secondary | ICD-10-CM | POA: Diagnosis not present

## 2024-01-27 DIAGNOSIS — G4733 Obstructive sleep apnea (adult) (pediatric): Secondary | ICD-10-CM | POA: Diagnosis not present

## 2024-01-27 DIAGNOSIS — E039 Hypothyroidism, unspecified: Secondary | ICD-10-CM | POA: Diagnosis not present

## 2024-01-27 DIAGNOSIS — E7800 Pure hypercholesterolemia, unspecified: Secondary | ICD-10-CM | POA: Diagnosis not present

## 2024-02-03 DIAGNOSIS — E039 Hypothyroidism, unspecified: Secondary | ICD-10-CM | POA: Diagnosis not present

## 2024-02-03 DIAGNOSIS — R42 Dizziness and giddiness: Secondary | ICD-10-CM | POA: Diagnosis not present

## 2024-02-03 DIAGNOSIS — I10 Essential (primary) hypertension: Secondary | ICD-10-CM | POA: Diagnosis not present

## 2024-02-03 DIAGNOSIS — E782 Mixed hyperlipidemia: Secondary | ICD-10-CM | POA: Diagnosis not present

## 2024-02-03 DIAGNOSIS — R7303 Prediabetes: Secondary | ICD-10-CM | POA: Diagnosis not present

## 2024-02-06 DIAGNOSIS — B356 Tinea cruris: Secondary | ICD-10-CM | POA: Diagnosis not present

## 2024-02-09 ENCOUNTER — Telehealth: Payer: Self-pay | Admitting: Adult Health

## 2024-02-09 DIAGNOSIS — G4733 Obstructive sleep apnea (adult) (pediatric): Secondary | ICD-10-CM | POA: Diagnosis not present

## 2024-02-09 DIAGNOSIS — I10 Essential (primary) hypertension: Secondary | ICD-10-CM | POA: Diagnosis not present

## 2024-02-09 DIAGNOSIS — E039 Hypothyroidism, unspecified: Secondary | ICD-10-CM | POA: Diagnosis not present

## 2024-02-09 DIAGNOSIS — E7800 Pure hypercholesterolemia, unspecified: Secondary | ICD-10-CM | POA: Diagnosis not present

## 2024-02-09 NOTE — Telephone Encounter (Addendum)
 Pt said, PCP, Broadus Canes advised to schedule an appt to be seen for mini strokes. Having vertigo and PCP want to see what's causing that. Would like a call back discuss an appointment for this year.

## 2024-02-09 NOTE — Telephone Encounter (Signed)
 Attempted to call Pt for appt. No answer, LVM for call back.  When Pt calls back please offer him an appt with Jessica for 02/27/24 at 8:45 AM.

## 2024-02-09 NOTE — Telephone Encounter (Signed)
 Pt called to Schedule appt  Appt  scheduled

## 2024-02-27 ENCOUNTER — Ambulatory Visit: Admitting: Adult Health

## 2024-02-27 ENCOUNTER — Encounter: Payer: Self-pay | Admitting: Adult Health

## 2024-02-27 NOTE — Progress Notes (Deleted)
 [facility] [address] [place]. [place] [address] [phone]       OFFICE FOLLOW UP NOTE  [name] Date of Birth:  03/22/1946 Medical Record Number:  [id number]    Primary neurologist: Dr. Rosemarie Reason for visit: stroke and vertigo follow up    SUBJECTIVE:   HPI:   Update 02/27/2024 JM: Patient returns per request for stroke concerns.   Completed 3 sessions of vestibular therapy but canceled remaining visits. Per initial eval, no objective findings consistent with BPPV and noted possible vestibular hypofunction.  Was noted to be orthostatic with supine BP 160/84 with HR 63 and standing BP 135/71 with HR 79.          History provided for reference purposes only Update 08/23/2023 JM: Patient returns for 49-month follow-up visit accompanied by his wife. Denies new stroke/TIA symptoms.  Compliant on Plavix  and Crestor . Blood pressure today slightly elevated but has not yet taken BP medications.  Routinely follows with PCP for stroke risk factor management. Continues to have occasional episodes of vertigo, will use meclizine as needed with benefit. Was never called to start vestibular therapy at prior visit but would be interested in participating. He does have occasional dizziness when going from sitting to standing too quickly or when bending over and standing up quickly. Feels this is different from his vertigo.  No further questions or concerns at this time.  Update 02/15/2023 Dr. Rosemarie: Patient is seen for follow-up today after last visit with Harlene nurse practitioner more than a year ago.  Patient states he was seen in the ER on 11/18/2022 and developed sudden onset of, right leg weakness, Palpitations.  His vision also became blurry and he describes it likely from moving.  Upon arrival to the ED was found to have right lower extremity drift and sensory loss involving the right face arm and leg blurred vision was improving.  Patient was evaluated by  teleneurology and noted to have only mild right-sided deficits.  Not severe enough to justify thrombolysis noncontrast CT head was negative.  CT angiogram of the head and occlusion.  CT perfusion was inconclusive and showed 16 mm perfusion deficit which was in multiple vascular territories and did not localize to his presenting symptoms.  MRI scan of the brain was negative for acute stroke and showed multiple old lacunar LDL cholesterol was 29 mg percent.  Hemoglobin A1c was 5.9.  Echocardiogram showed ejection fraction of 60 to 65% without cardiac source of embolism.  Patient is currently wearing a 2-week Zio patch to look for paroxysmal A-fib.  Patient states is done well since discharge.  Has had no recurrent TIA or stroke symptoms.  He remains on Plavix  which is tolerating well with minor bruising and no bleeding.  Blood pressure is under good control.  He is tolerating Crestor  well without muscle aches or pains.  He continues to have intermittent dizziness which he describes as mostly positional when he makes a sudden movement or gets up quickly particularly to the left.  He has never been seen by physical therapy and vestibular stabilization exercises.  He does have decreased hearing bilaterally.  He has never been formally diagnosed with benign paroxysmal positional vertigo.  Update 12/16/2021 JM: Patient returns for 5-month stroke follow-up accompanied by his wife, [name].  Overall stable without new or reoccurring stroke/TIA symptoms.  Chronic gait impairment stable, no use of AD, no recent falls.  Reports continued intermittent vertigo, stable since prior visit, occasional use of meclizine  with benefit managed by PCP.  Compliant on Plavix  and simvastatin , denies side effects.  Blood pressure today 142/88 - has not yet taken morning medications.  He has follow-up visit scheduled tomorrow with PCP Dr. Trudy.  No further concerns at this time.  Initial visit 06/10/2021 JM: [name] is being seen for  hospital follow-up accompanied by his wife, [name].  Overall doing well.  Denies new or reoccurring stroke/TIA symptoms.  Chronic gait impairment with imbalance since prior stroke - denies any worsening. Occasional baseline vertigo with use of meclizine with benefit (managed by PCP).  He has tried to return back to prior activities such as yard work and home improvements (such as painting) but his kids will not allow him to do so.  Also worsening lower back pain limiting - plans to schedule f/u with neuro surg.   Remains on Plavix  and simvastatin  without side effects.  Blood pressure today 145/81. Routinely monitors - does fluctuate but has been modifying diet and avoiding high sodium foods. 30-day cardiac event monitor completed but only worn for 9 days but did not show evidence of atrial fibrillation.  Has appt with PCP in Nov for physical and lab work.  No further concerns at this time.   Stroke admission 03/28/2021 [name] is a 78 y.o. male with history of Htn, Hld and a previous cerebellar stroke who presented on 03/28/2021 after an intense bout of vertigo and lightheadedness associated with bilateral numbness, right > left.  Personally reviewed hospitalization pertinent progress notes, lab work and imaging.  Evaluated by Dr. Jerri.  Episode of vertigo likely possible BPPV and referred to outpatient vestibular PT.  Incidental finding of small acute/subacute white matter infarct in the left frontal lobe, MCA/ACA watershed area likely secondary to small vessel disease but cannot rule out cardioembolic source.  Evidence of chronic lacunar infarcts in the right thalamic and PLIC as well as chronic microhemorrhages likely secondary to chronic hypertension on MRI.  CTA head/neck no significant stenosis.  EF 60 to 65%.  Recommended 30-day cardiac event monitor to rule out A. fib.  LDL 50.  A1c 6.1 (no hx/dx of DM).  Initiated Plavix  75 mg daily - ASA not recommended due to history of LGIB on ASA.  Resumed  home Lozol  and Inderal  for HTN as well as Zocor  for HLD.  PT/OT recommended outpatient therapies and discharged home.   PERTINENT IMAGING  CT HEAD 03/28/2021 IMPRESSION: 1. No evidence of acute intracranial abnormality. 2. Severe chronic small vessel ischemic disease. 3. Small chronic cerebellar infarct.  MR BRAIN 03/28/2021 MRA HEAD/NECK IMPRESSION: 1. Small acute or subacute white matter infarct in the left frontal lobe. 2. Severe chronic small vessel ischemic disease with chronic lacunar infarcts in the cerebrum and cerebellum. 3. No major arterial occlusion or convincing flow limiting proximal stenosis in the head and neck within limitations of artifact as detailed above.  2D ECHO  IMPRESSIONS   1. Left ventricular ejection fraction, by estimation, is 60 to 65%. The  left ventricle has normal function. The left ventricle has no regional  wall motion abnormalities. Left ventricular diastolic parameters were  normal.   2. Right ventricular systolic function is normal. The right ventricular  size is normal. Tricuspid regurgitation signal is inadequate for assessing  PA pressure.   3. The mitral valve is grossly normal. Trivial mitral valve  regurgitation.   4. The aortic valve is tricuspid. Aortic valve regurgitation is not  visualized. Mild aortic valve sclerosis is present, with  no evidence of  aortic valve stenosis. Aortic valve mean gradient measures 5.0 mmHg.  CARDIAC MONITOR Per report by Dr. Jayson Sierras: 9 days analyzed.  Predominant rhythm is sinus with IVCD, heart rate ranging from 53 bpm up to 101 bpm and average heart rate 64 bpm.  No atrial fibrillation was noted.  There were no pauses.        ROS:   14 system review of systems performed and negative with exception of those listed in HPI  PMH:  Past Medical History:  Diagnosis Date   Aphakia, right eye 07/02/2021   Onset post cataract surgery with dislocation of IOL 07/01/2021  Vitrectomy, insertion  three-piece IOL sulcus 07/03/2021   Choroidal detachment, serous, right eye 08/12/2021   Late onset post vitrectomy for dislocated IOL, retained lens fragments and IOL repositioning  08/19/2021, resolved choroidal detachments post topical therapy with atropine    Chronic low back pain    High cholesterol    Hypertension    OSA (obstructive sleep apnea)    Intolerant to CPAP   Prediabetes    Stroke [facility])     PSH:  Past Surgical History:  Procedure Laterality Date   CATARACT EXTRACTION Right 07/01/2021   Dislocated IOL   HAND SURGERY     HERNIA REPAIR     four hernia repairs in the 1990s   NOSE SURGERY     TONSILLECTOMY      Social History:  Social History   Socioeconomic History   Marital status: Married    Spouse name: Not on file   Number of children: Not on file   Years of education: Not on file   Highest education level: Not on file  Occupational History   Not on file  Tobacco Use   Smoking status: Former    Current packs/day: 0.00    Average packs/day: 3.0 packs/day for 31.0 years (93.0 ttl pk-yrs)    Types: Cigarettes    Start date: 09/06/1956    Quit date: 09/07/1987    Years since quitting: 36.4   Smokeless tobacco: Never   Tobacco comments:    Pt reports that at the most he smoked 3ppd  Vaping Use   Vaping status: Never Used  Substance and Sexual Activity   Alcohol use: Yes    Comment: occ, never regularly   Drug use: Never   Sexual activity: Not on file  Other Topics Concern   Not on file  Social History Narrative   Lives with wife    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  Food Insecurity: No Food Insecurity (11/19/2022)   Hunger Vital Sign    Worried About Running Out of Food in the Last Year: Never true    Ran Out of Food in the Last Year: Never true  Transportation Needs: No Transportation Needs (11/19/2022)   PRAPARE - Administrator, Civil Service (Medical): No    Lack of Transportation  (Non-Medical): No  Physical Activity: Not on file  Stress: Not on file  Social Connections: Not on file  Intimate Partner Violence: Not At Risk (11/19/2022)   Humiliation, Afraid, Rape, and Kick questionnaire    Fear of Current or Ex-Partner: No    Emotionally Abused: No    Physically Abused: No    Sexually Abused: No    Family History:  Family History  Problem Relation Age of Onset   Cirrhosis Father  died in his 40s   Ovarian cancer Sister    Throat cancer Brother    Stroke Neg Hx    Colon cancer Neg Hx     Medications:   Current Outpatient Medications on File Prior to Visit  Medication Sig Dispense Refill   amLODipine  (NORVASC ) 10 MG tablet Take 10 mg by mouth daily.     clopidogrel  (PLAVIX ) 75 MG tablet Take 75 mg by mouth daily.     ezetimibe (ZETIA) 10 MG tablet Take 10 mg by mouth daily.     indapamide  (LOZOL ) 2.5 MG tablet Take 2.5 mg by mouth daily.     levothyroxine (SYNTHROID) 25 MCG tablet Take 25 mcg by mouth daily.     lisinopril  (ZESTRIL ) 40 MG tablet Take 40 mg by mouth daily.     loratadine (CLARITIN) 10 MG tablet Take 10 mg by mouth daily.     meclizine (ANTIVERT) 12.5 MG tablet Take 12.5 mg by mouth 3 (three) times daily as needed for nausea.     metoprolol  succinate (TOPROL  XL) 25 MG 24 hr tablet Take 0.5 tablets (12.5 mg total) by mouth at bedtime. 45 tablet 3   Omega-3 Fatty Acids (FISH OIL) 1200 MG CAPS Take 1 capsule by mouth daily.     OXYGEN  Inhale 2 L into the lungs every evening.     potassium chloride  SA (KLOR-CON  M) 20 MEQ tablet Take 20 mEq by mouth 2 (two) times daily.     RESTASIS 0.05 % ophthalmic emulsion Apply to eye.     rosuvastatin  (CRESTOR ) 20 MG tablet Take 1 tablet (20 mg total) by mouth daily. 30 tablet 5   tamsulosin  (FLOMAX ) 0.4 MG CAPS capsule Take 0.4 mg by mouth daily.     No current facility-administered medications on file prior to visit.    Allergies:   Allergies  Allergen Reactions   Penicillins Anaphylaxis       OBJECTIVE:  Physical Exam  There were no vitals filed for this visit.  There is no height or weight on file to calculate BMI. No results found.  General: well developed, well nourished, very pleasant elderly Caucasian male, seated, in no evident distress  Neurologic Exam Mental Status: Awake and fully alert.  Fluent speech and language.  Oriented to place and time. Recent and remote memory intact. Attention span, concentration and fund of knowledge appropriate. Mood and affect appropriate.  Cranial Nerves: OS Pupils briskly reactive to light. OD Irregular pupil (chronic). extraocular movements full without nystagmus. Visual fields full to confrontation. Hearing intact. Facial sensation intact. Face, tongue, palate moves normally and symmetrically.  Motor: Normal bulk and tone. Normal strength in all tested extremity muscles Sensory.: intact to touch , pinprick , position and vibratory sensation.  Coordination: Rapid alternating movements normal in all extremities. Finger-to-nose and heel-to-shin performed accurately bilaterally. Gait and Station: Arises from chair without difficulty. Stance is normal. Gait demonstrates normal stride length and mild imbalance without use of assistive device.  Unable to complete tandem walk and heel toe.  Reflexes: 1+ and symmetric. Toes downgoing.         ASSESSMENT: Austin Bruce is a 78 y.o. year old male with episode of possible BPPV and incidental finding of small acute/subacute laminar infarct in the left frontal lobe and MCA/ACA watershed area likely secondary to small vessel disease but cannot rule out cardioembolic source on 03/28/2021 after presenting with intense bout of vertigo and lightheadedness associated with bilateral numbness.  Transient episode 11/2022 of RLE weakness and  right face and leg sensory loss and blurred vision possibly in setting of TIA.  Reported intermittent bouts of dizziness at prior visit in 02/2023 likely in setting  of BPPV     PLAN:  Dizziness:  Possible multiple etiologies including orthostatic hypotension, possibly vestibular hypoperfusion and hx of prior strokes Chronic history of vertigo - occurs intermittently Referral placed to [facility] for vestibular rehab Use of meclizine as needed per PCP.    Stroke, incidental finding (as above):  Hx of cerebellar stroke:  continue clopidogrel  75 mg daily  and simvastatin  40 mg daily for secondary stroke prevention.   Cardiac event monitor negative for atrial fibrillation Discussed secondary stroke prevention measures and importance of close PCP follow up for aggressive stroke risk factor management including BP goal<130/90, and HLD with LDL goal<70  I have gone over the pathophysiology of stroke, warning signs and symptoms, risk factors and their management in some detail with instructions to go to the closest emergency room for symptoms of concern.     Overall stable from neurological standpoint at this time without further recommendations.  Can return back to PCP at this time.  He was advised to call in the future with any questions or concerns    CC:  PCP: Trudy Vaughn FALCON, MD    I personally spent a total of *** minutes in the care of the patient today including {Time Based Coding:210964241}.    Harlene Bogaert, AGNP-BC  [facility] [address] [place], [place] [address]  Phone [phone] Fax [phone] Note: This document was prepared with digital dictation and possible smart phrase technology. Any transcriptional errors that result from this process are unintentional.

## 2024-03-05 DIAGNOSIS — R7303 Prediabetes: Secondary | ICD-10-CM | POA: Diagnosis not present

## 2024-03-05 DIAGNOSIS — I10 Essential (primary) hypertension: Secondary | ICD-10-CM | POA: Diagnosis not present

## 2024-03-05 DIAGNOSIS — R42 Dizziness and giddiness: Secondary | ICD-10-CM | POA: Diagnosis not present

## 2024-03-05 DIAGNOSIS — E039 Hypothyroidism, unspecified: Secondary | ICD-10-CM | POA: Diagnosis not present

## 2024-03-05 DIAGNOSIS — E782 Mixed hyperlipidemia: Secondary | ICD-10-CM | POA: Diagnosis not present

## 2024-03-06 DIAGNOSIS — M6281 Muscle weakness (generalized): Secondary | ICD-10-CM | POA: Diagnosis not present

## 2024-03-06 DIAGNOSIS — R269 Unspecified abnormalities of gait and mobility: Secondary | ICD-10-CM | POA: Diagnosis not present

## 2024-03-06 DIAGNOSIS — Z8673 Personal history of transient ischemic attack (TIA), and cerebral infarction without residual deficits: Secondary | ICD-10-CM | POA: Diagnosis not present

## 2024-03-12 DIAGNOSIS — Z8673 Personal history of transient ischemic attack (TIA), and cerebral infarction without residual deficits: Secondary | ICD-10-CM | POA: Diagnosis not present

## 2024-03-12 DIAGNOSIS — M6281 Muscle weakness (generalized): Secondary | ICD-10-CM | POA: Diagnosis not present

## 2024-03-12 DIAGNOSIS — R269 Unspecified abnormalities of gait and mobility: Secondary | ICD-10-CM | POA: Diagnosis not present

## 2024-03-14 DIAGNOSIS — R269 Unspecified abnormalities of gait and mobility: Secondary | ICD-10-CM | POA: Diagnosis not present

## 2024-03-14 DIAGNOSIS — Z8673 Personal history of transient ischemic attack (TIA), and cerebral infarction without residual deficits: Secondary | ICD-10-CM | POA: Diagnosis not present

## 2024-03-14 DIAGNOSIS — M6281 Muscle weakness (generalized): Secondary | ICD-10-CM | POA: Diagnosis not present

## 2024-03-19 DIAGNOSIS — R269 Unspecified abnormalities of gait and mobility: Secondary | ICD-10-CM | POA: Diagnosis not present

## 2024-03-19 DIAGNOSIS — M6281 Muscle weakness (generalized): Secondary | ICD-10-CM | POA: Diagnosis not present

## 2024-03-19 DIAGNOSIS — Z8673 Personal history of transient ischemic attack (TIA), and cerebral infarction without residual deficits: Secondary | ICD-10-CM | POA: Diagnosis not present

## 2024-03-21 DIAGNOSIS — R269 Unspecified abnormalities of gait and mobility: Secondary | ICD-10-CM | POA: Diagnosis not present

## 2024-03-21 DIAGNOSIS — M6281 Muscle weakness (generalized): Secondary | ICD-10-CM | POA: Diagnosis not present

## 2024-03-21 DIAGNOSIS — Z8673 Personal history of transient ischemic attack (TIA), and cerebral infarction without residual deficits: Secondary | ICD-10-CM | POA: Diagnosis not present

## 2024-03-26 DIAGNOSIS — R269 Unspecified abnormalities of gait and mobility: Secondary | ICD-10-CM | POA: Diagnosis not present

## 2024-03-26 DIAGNOSIS — M6281 Muscle weakness (generalized): Secondary | ICD-10-CM | POA: Diagnosis not present

## 2024-03-26 DIAGNOSIS — Z8673 Personal history of transient ischemic attack (TIA), and cerebral infarction without residual deficits: Secondary | ICD-10-CM | POA: Diagnosis not present

## 2024-03-28 DIAGNOSIS — R269 Unspecified abnormalities of gait and mobility: Secondary | ICD-10-CM | POA: Diagnosis not present

## 2024-03-28 DIAGNOSIS — M6281 Muscle weakness (generalized): Secondary | ICD-10-CM | POA: Diagnosis not present

## 2024-03-28 DIAGNOSIS — Z8673 Personal history of transient ischemic attack (TIA), and cerebral infarction without residual deficits: Secondary | ICD-10-CM | POA: Diagnosis not present

## 2024-04-05 DIAGNOSIS — H43811 Vitreous degeneration, right eye: Secondary | ICD-10-CM | POA: Diagnosis not present

## 2024-04-05 DIAGNOSIS — E782 Mixed hyperlipidemia: Secondary | ICD-10-CM | POA: Diagnosis not present

## 2024-04-05 DIAGNOSIS — E039 Hypothyroidism, unspecified: Secondary | ICD-10-CM | POA: Diagnosis not present

## 2024-04-05 DIAGNOSIS — H04123 Dry eye syndrome of bilateral lacrimal glands: Secondary | ICD-10-CM | POA: Diagnosis not present

## 2024-04-05 DIAGNOSIS — R42 Dizziness and giddiness: Secondary | ICD-10-CM | POA: Diagnosis not present

## 2024-04-05 DIAGNOSIS — I10 Essential (primary) hypertension: Secondary | ICD-10-CM | POA: Diagnosis not present

## 2024-04-05 DIAGNOSIS — H27131 Posterior dislocation of lens, right eye: Secondary | ICD-10-CM | POA: Diagnosis not present

## 2024-04-05 DIAGNOSIS — R7303 Prediabetes: Secondary | ICD-10-CM | POA: Diagnosis not present

## 2024-04-09 DIAGNOSIS — R269 Unspecified abnormalities of gait and mobility: Secondary | ICD-10-CM | POA: Diagnosis not present

## 2024-04-09 DIAGNOSIS — M6281 Muscle weakness (generalized): Secondary | ICD-10-CM | POA: Diagnosis not present

## 2024-04-09 DIAGNOSIS — Z8673 Personal history of transient ischemic attack (TIA), and cerebral infarction without residual deficits: Secondary | ICD-10-CM | POA: Diagnosis not present

## 2024-04-16 DIAGNOSIS — H04123 Dry eye syndrome of bilateral lacrimal glands: Secondary | ICD-10-CM | POA: Diagnosis not present

## 2024-04-16 DIAGNOSIS — Z961 Presence of intraocular lens: Secondary | ICD-10-CM | POA: Diagnosis not present

## 2024-04-16 DIAGNOSIS — H21261 Iris atrophy (essential) (progressive), right eye: Secondary | ICD-10-CM | POA: Diagnosis not present

## 2024-05-04 DIAGNOSIS — R42 Dizziness and giddiness: Secondary | ICD-10-CM | POA: Diagnosis not present

## 2024-05-04 DIAGNOSIS — I10 Essential (primary) hypertension: Secondary | ICD-10-CM | POA: Diagnosis not present

## 2024-05-04 DIAGNOSIS — E039 Hypothyroidism, unspecified: Secondary | ICD-10-CM | POA: Diagnosis not present

## 2024-05-04 DIAGNOSIS — R7303 Prediabetes: Secondary | ICD-10-CM | POA: Diagnosis not present

## 2024-05-04 DIAGNOSIS — E782 Mixed hyperlipidemia: Secondary | ICD-10-CM | POA: Diagnosis not present

## 2024-05-08 DIAGNOSIS — H2189 Other specified disorders of iris and ciliary body: Secondary | ICD-10-CM | POA: Diagnosis not present

## 2024-05-11 DIAGNOSIS — R7303 Prediabetes: Secondary | ICD-10-CM | POA: Diagnosis not present

## 2024-05-11 DIAGNOSIS — I10 Essential (primary) hypertension: Secondary | ICD-10-CM | POA: Diagnosis not present

## 2024-05-11 DIAGNOSIS — R748 Abnormal levels of other serum enzymes: Secondary | ICD-10-CM | POA: Diagnosis not present

## 2024-05-15 DIAGNOSIS — R7303 Prediabetes: Secondary | ICD-10-CM | POA: Diagnosis not present

## 2024-05-15 DIAGNOSIS — R42 Dizziness and giddiness: Secondary | ICD-10-CM | POA: Diagnosis not present

## 2024-05-15 DIAGNOSIS — I10 Essential (primary) hypertension: Secondary | ICD-10-CM | POA: Diagnosis not present

## 2024-05-15 DIAGNOSIS — E039 Hypothyroidism, unspecified: Secondary | ICD-10-CM | POA: Diagnosis not present

## 2024-05-16 ENCOUNTER — Other Ambulatory Visit: Payer: Self-pay | Admitting: Nurse Practitioner

## 2024-06-05 DIAGNOSIS — E782 Mixed hyperlipidemia: Secondary | ICD-10-CM | POA: Diagnosis not present

## 2024-06-05 DIAGNOSIS — E039 Hypothyroidism, unspecified: Secondary | ICD-10-CM | POA: Diagnosis not present

## 2024-06-05 DIAGNOSIS — I10 Essential (primary) hypertension: Secondary | ICD-10-CM | POA: Diagnosis not present

## 2024-06-05 DIAGNOSIS — R7303 Prediabetes: Secondary | ICD-10-CM | POA: Diagnosis not present

## 2024-06-05 DIAGNOSIS — R42 Dizziness and giddiness: Secondary | ICD-10-CM | POA: Diagnosis not present

## 2024-06-21 DIAGNOSIS — Z85828 Personal history of other malignant neoplasm of skin: Secondary | ICD-10-CM | POA: Diagnosis not present

## 2024-06-21 DIAGNOSIS — D225 Melanocytic nevi of trunk: Secondary | ICD-10-CM | POA: Diagnosis not present

## 2024-06-21 DIAGNOSIS — Z08 Encounter for follow-up examination after completed treatment for malignant neoplasm: Secondary | ICD-10-CM | POA: Diagnosis not present

## 2024-06-21 DIAGNOSIS — D0461 Carcinoma in situ of skin of right upper limb, including shoulder: Secondary | ICD-10-CM | POA: Diagnosis not present

## 2024-07-19 NOTE — Progress Notes (Unsigned)
 [facility] [address] [place]. [place] [address] [phone]       OFFICE FOLLOW UP NOTE  [name] Date of Birth:  [birth year] Medical Record Number:  [id number]    Primary neurologist: Dr. Rosemarie Reason for visit: vertigo    SUBJECTIVE:   HPI:    Update 07/20/2024 JM: Patient returns for follow-up visit per request due to recurrent vertigo.  At prior visit, he was referred to vestibular rehab and completed a total of 3 visits back in February, he no showed follow up visit and never called to make additional visits. Initial consult note reviewed, did not feel symptoms consistent with BPPV and suspected more of vestibular hypofunction.       History provided for reference purposes only Update 08/23/2023 JM: Patient returns for 69-month follow-up visit accompanied by his wife. Denies new stroke/TIA symptoms.  Compliant on Plavix  and Crestor . Blood pressure today slightly elevated but has not yet taken BP medications.  Routinely follows with PCP for stroke risk factor management. Continues to have occasional episodes of vertigo, will use meclizine as needed with benefit. Was never called to start vestibular therapy at prior visit but would be interested in participating. He does have occasional dizziness when going from sitting to standing too quickly or when bending over and standing up quickly. Feels this is different from his vertigo.  No further questions or concerns at this time.  Update 02/15/2023 Dr. Rosemarie: Patient is seen for follow-up today after last visit with Austin Bruce nurse practitioner more than a year ago.  Patient states he was seen in the ER on 11/18/2022 and developed sudden onset of, right leg weakness, Palpitations.  His vision also became blurry and he describes it likely from moving.  Upon arrival to the ED was found to have right lower extremity drift and sensory loss involving the right face arm and leg blurred vision was improving.   Patient was evaluated by teleneurology and noted to have only mild right-sided deficits.  Not severe enough to justify thrombolysis noncontrast CT head was negative.  CT angiogram of the head and occlusion.  CT perfusion was inconclusive and showed 16 mm perfusion deficit which was in multiple vascular territories and did not localize to his presenting symptoms.  MRI scan of the brain was negative for acute stroke and showed multiple old lacunar LDL cholesterol was 29 mg percent.  Hemoglobin A1c was 5.9.  Echocardiogram showed ejection fraction of 60 to 65% without cardiac source of embolism.  Patient is currently wearing a 2-week Zio patch to look for paroxysmal A-fib.  Patient states is done well since discharge.  Has had no recurrent TIA or stroke symptoms.  He remains on Plavix  which is tolerating well with minor bruising and no bleeding.  Blood pressure is under good control.  He is tolerating Crestor  well without muscle aches or pains.  He continues to have intermittent dizziness which he describes as mostly positional when he makes a sudden movement or gets up quickly particularly to the left.  He has never been seen by physical therapy and vestibular stabilization exercises.  He does have decreased hearing bilaterally.  He has never been formally diagnosed with benign paroxysmal positional vertigo.  Update 12/16/2021 JM: Patient returns for 95-month stroke follow-up accompanied by his wife, Austin Bruce.  Overall stable without new or reoccurring stroke/TIA symptoms.  Chronic gait impairment stable, no use of AD, no recent falls.  Reports continued intermittent vertigo, stable since prior visit, occasional use  of meclizine with benefit managed by PCP.  Compliant on Plavix  and simvastatin , denies side effects.  Blood pressure today 142/88 - has not yet taken morning medications.  He has follow-up visit scheduled tomorrow with PCP Dr. Trudy.  No further concerns at this time.  Initial visit 06/10/2021 JM: Mr.  Austin Bruce is being seen for hospital follow-up accompanied by his wife, Austin Bruce.  Overall doing well.  Denies new or reoccurring stroke/TIA symptoms.  Chronic gait impairment with imbalance since prior stroke - denies any worsening. Occasional baseline vertigo with use of meclizine with benefit (managed by PCP).  He has tried to return back to prior activities such as yard work and home improvements (such as painting) but his kids will not allow him to do so.  Also worsening lower back pain limiting - plans to schedule f/u with neuro surg.   Remains on Plavix  and simvastatin  without side effects.  Blood pressure today 145/81. Routinely monitors - does fluctuate but has been modifying diet and avoiding high sodium foods. 30-day cardiac event monitor completed but only worn for 9 days but did not show evidence of atrial fibrillation.  Has appt with PCP in Nov for physical and lab work.  No further concerns at this time.   Stroke admission 03/28/2021 [name] is a 78 y.o. male with history of Htn, Hld and a previous cerebellar stroke who presented on 03/28/2021 after an intense bout of vertigo and lightheadedness associated with bilateral numbness, right > left.  Personally reviewed hospitalization pertinent progress notes, lab work and imaging.  Evaluated by Dr. Jerri.  Episode of vertigo likely possible BPPV and referred to outpatient vestibular PT.  Incidental finding of small acute/subacute white matter infarct in the left frontal lobe, MCA/ACA watershed area likely secondary to small vessel disease but cannot rule out cardioembolic source.  Evidence of chronic lacunar infarcts in the right thalamic and PLIC as well as chronic microhemorrhages likely secondary to chronic hypertension on MRI.  CTA head/neck no significant stenosis.  EF 60 to 65%.  Recommended 30-day cardiac event monitor to rule out A. fib.  LDL 50.  A1c 6.1 (no hx/dx of DM).  Initiated Plavix  75 mg daily - ASA not recommended due to history of  LGIB on ASA.  Resumed home Lozol  and Inderal  for HTN as well as Zocor  for HLD.  PT/OT recommended outpatient therapies and discharged home.   PERTINENT IMAGING  CT HEAD 03/28/2021 IMPRESSION: 1. No evidence of acute intracranial abnormality. 2. Severe chronic small vessel ischemic disease. 3. Small chronic cerebellar infarct.  MR BRAIN 03/28/2021 MRA HEAD/NECK IMPRESSION: 1. Small acute or subacute white matter infarct in the left frontal lobe. 2. Severe chronic small vessel ischemic disease with chronic lacunar infarcts in the cerebrum and cerebellum. 3. No major arterial occlusion or convincing flow limiting proximal stenosis in the head and neck within limitations of artifact as detailed above.  2D ECHO  IMPRESSIONS   1. Left ventricular ejection fraction, by estimation, is 60 to 65%. The  left ventricle has normal function. The left ventricle has no regional  wall motion abnormalities. Left ventricular diastolic parameters were  normal.   2. Right ventricular systolic function is normal. The right ventricular  size is normal. Tricuspid regurgitation signal is inadequate for assessing  PA pressure.   3. The mitral valve is grossly normal. Trivial mitral valve  regurgitation.   4. The aortic valve is tricuspid. Aortic valve regurgitation is not  visualized. Mild aortic valve sclerosis is  present, with no evidence of  aortic valve stenosis. Aortic valve mean gradient measures 5.0 mmHg.  CARDIAC MONITOR Per report by Dr. Jayson Sierras: 9 days analyzed.  Predominant rhythm is sinus with IVCD, heart rate ranging from 53 bpm up to 101 bpm and average heart rate 64 bpm.  No atrial fibrillation was noted.  There were no pauses.        ROS:   14 system review of systems performed and negative with exception of those listed in HPI  PMH:  Past Medical History:  Diagnosis Date   Aphakia, right eye 07/02/2021   Onset post cataract surgery with dislocation of IOL 07/01/2021   Vitrectomy, insertion three-piece IOL sulcus 07/03/2021   Choroidal detachment, serous, right eye 08/12/2021   Late onset post vitrectomy for dislocated IOL, retained lens fragments and IOL repositioning  08/19/2021, resolved choroidal detachments post topical therapy with atropine    Chronic low back pain    High cholesterol    Hypertension    OSA (obstructive sleep apnea)    Intolerant to CPAP   Prediabetes    Stroke [facility])     PSH:  Past Surgical History:  Procedure Laterality Date   CATARACT EXTRACTION Right 07/01/2021   Dislocated IOL   HAND SURGERY     HERNIA REPAIR     four hernia repairs in the 1990s   NOSE SURGERY     TONSILLECTOMY      Social History:  Social History   Socioeconomic History   Marital status: Married    Spouse name: Not on file   Number of children: Not on file   Years of education: Not on file   Highest education level: Not on file  Occupational History   Not on file  Tobacco Use   Smoking status: Former    Current packs/day: 0.00    Average packs/day: 3.0 packs/day for 31.0 years (93.0 ttl pk-yrs)    Types: Cigarettes    Start date: 09/06/1956    Quit date: 09/07/1987    Years since quitting: 36.8   Smokeless tobacco: Never   Tobacco comments:    Pt reports that at the most he smoked 3ppd  Vaping Use   Vaping status: Never Used  Substance and Sexual Activity   Alcohol use: Yes    Comment: occ, never regularly   Drug use: Never   Sexual activity: Not on file  Other Topics Concern   Not on file  Social History Narrative   Lives with wife    Pt retired    Chief Executive Officer [employer] Strain: Not on file  Food Insecurity: No Food Insecurity (11/19/2022)   Hunger Vital Sign    Worried About Running Out of Food in the Last Year: Never true    Ran Out of Food in the Last Year: Never true  Transportation Needs: No Transportation Needs (11/19/2022)   PRAPARE - Administrator, Civil Service (Medical): No     Lack of Transportation (Non-Medical): No  Physical Activity: Not on file  Stress: Not on file  Social Connections: Not on file  Intimate Partner Violence: Not At Risk (11/19/2022)   Humiliation, Afraid, Rape, and Kick questionnaire    Fear of Current or Ex-Partner: No    Emotionally Abused: No    Physically Abused: No    Sexually Abused: No    Family History:  Family History  Problem Relation Age of Onset   Cirrhosis Father  died in his 72s   Ovarian cancer Sister    Throat cancer Brother    Stroke Neg Hx    Colon cancer Neg Hx     Medications:   Current Outpatient Medications on File Prior to Visit  Medication Sig Dispense Refill   amLODipine  (NORVASC ) 10 MG tablet Take 10 mg by mouth daily.     clopidogrel  (PLAVIX ) 75 MG tablet Take 75 mg by mouth daily.     ezetimibe (ZETIA) 10 MG tablet Take 10 mg by mouth daily.     indapamide  (LOZOL ) 2.5 MG tablet Take 2.5 mg by mouth daily.     levothyroxine (SYNTHROID) 25 MCG tablet Take 25 mcg by mouth daily.     lisinopril  (ZESTRIL ) 40 MG tablet Take 40 mg by mouth daily.     loratadine (CLARITIN) 10 MG tablet Take 10 mg by mouth daily.     meclizine (ANTIVERT) 12.5 MG tablet Take 12.5 mg by mouth 3 (three) times daily as needed for nausea.     metoprolol  succinate (TOPROL -XL) 25 MG 24 hr tablet TAKE 1/2 (ONE-HALF) TABLET BY MOUTH AT BEDTIME 30 tablet 0   Omega-3 Fatty Acids (FISH OIL) 1200 MG CAPS Take 1 capsule by mouth daily.     OXYGEN  Inhale 2 L into the lungs every evening.     potassium chloride  SA (KLOR-CON  M) 20 MEQ tablet Take 20 mEq by mouth 2 (two) times daily.     RESTASIS 0.05 % ophthalmic emulsion Apply to eye.     rosuvastatin  (CRESTOR ) 20 MG tablet Take 1 tablet (20 mg total) by mouth daily. 30 tablet 5   tamsulosin  (FLOMAX ) 0.4 MG CAPS capsule Take 0.4 mg by mouth daily.     No current facility-administered medications on file prior to visit.    Allergies:   Allergies  Allergen Reactions   Penicillins  Anaphylaxis      OBJECTIVE:  Physical Exam  There were no vitals filed for this visit.  There is no height or weight on file to calculate BMI. No results found.  General: well developed, well nourished, very pleasant elderly Caucasian male, seated, in no evident distress  Neurologic Exam Mental Status: Awake and fully alert.  Fluent speech and language.  Oriented to place and time. Recent and remote memory intact. Attention span, concentration and fund of knowledge appropriate. Mood and affect appropriate.  Cranial Nerves: OS Pupils briskly reactive to light. OD Irregular pupil (chronic). extraocular movements full without nystagmus. Visual fields full to confrontation. Hearing intact. Facial sensation intact. Face, tongue, palate moves normally and symmetrically.  Motor: Normal bulk and tone. Normal strength in all tested extremity muscles Sensory.: intact to touch , pinprick , position and vibratory sensation.  Coordination: Rapid alternating movements normal in all extremities. Finger-to-nose and heel-to-shin performed accurately bilaterally. Gait and Station: Arises from chair without difficulty. Stance is normal. Gait demonstrates normal stride length and mild imbalance without use of assistive device.  Unable to complete tandem walk and heel toe.  Reflexes: 1+ and symmetric. Toes downgoing.         ASSESSMENT: Austin Bruce is a 78 y.o. year old male with episode of possible BPPV and incidental finding (due to with presenting symptoms) of small acute/subacute laminar infarct in the left frontal lobe and MCA/ACA watershed area likely secondary to small vessel disease but cannot rule out cardioembolic source on 03/28/2021 after presenting with intense bout of vertigo and lightheadedness associated with bilateral numbness.  Transient episode 11/2022 of RLE  weakness and right face and leg sensory loss and blurred vision possibly in setting of TIA.  Reported intermittent bouts of  dizziness at prior visit in 02/2023 likely in setting of BPPV     PLAN:  Possible BPPV:  Chronic history of vertigo - occurs intermittently Referral placed to [facility] for vestibular rehab Use of meclizine as needed per PCP.    Stroke, incidental finding (as above):  Hx of cerebellar stroke:  continue clopidogrel  75 mg daily  and simvastatin  40 mg daily for secondary stroke prevention.   Cardiac event monitor negative for atrial fibrillation Discussed secondary stroke prevention measures and importance of close PCP follow up for aggressive stroke risk factor management including BP goal<130/90, and HLD with LDL goal<70  I have gone over the pathophysiology of stroke, warning signs and symptoms, risk factors and their management in some detail with instructions to go to the closest emergency room for symptoms of concern.     Overall stable from neurological standpoint at this time without further recommendations.  Can return back to PCP at this time.  He was advised to call in the future with any questions or concerns    CC:  PCP: Austin Bruce Vaughn FALCON, MD       Austin Bruce Bogaert, AGNP-BC  [facility] [address] [place], [place] [address]  Phone [phone] Fax [phone] Note: This document was prepared with digital dictation and possible smart phrase technology. Any transcriptional errors that result from this process are unintentional.

## 2024-07-20 ENCOUNTER — Ambulatory Visit: Admitting: Adult Health

## 2024-07-20 ENCOUNTER — Encounter: Payer: Self-pay | Admitting: Adult Health

## 2024-07-20 VITALS — BP 144/89 | HR 70 | Ht 69.0 in | Wt 217.0 lb

## 2024-07-20 DIAGNOSIS — Z8673 Personal history of transient ischemic attack (TIA), and cerebral infarction without residual deficits: Secondary | ICD-10-CM | POA: Diagnosis not present

## 2024-07-20 DIAGNOSIS — R42 Dizziness and giddiness: Secondary | ICD-10-CM

## 2024-07-20 NOTE — Patient Instructions (Addendum)
 Continue to use meclizine as needed for vertigo. If this should worsen or you feel the meclizine is not effective, would recommend further evaluation with ENT provider  Continue clopidogrel  75 mg daily, Zetia and Crestor   for secondary stroke prevention  Continue to follow up with PCP regarding blood pressure and cholesterol management  Maintain strict control of hypertension with blood pressure goal below 130/90 and cholesterol with LDL cholesterol (bad cholesterol) goal below 70 mg/dL.   Signs of a Stroke? Follow the BEFAST method:  Balance Watch for a sudden loss of balance, trouble with coordination or vertigo Eyes Is there a sudden loss of vision in one or both eyes? Or double vision?  Face: Ask the person to smile. Does one side of the face droop or is it numb?  Arms: Ask the person to raise both arms. Does one arm drift downward? Is there weakness or numbness of a leg? Speech: Ask the person to repeat a simple phrase. Does the speech sound slurred/strange? Is the person confused ? Time: If you observe any of these signs, call 911.    No further recommendations from neurological standpoint. You can continue to follow with your PCP but please call with any questions or concerns in the future.      Thank you for coming to see us  at [facility]. I hope we have been able to provide you high quality care today.  You may receive a patient satisfaction survey over the next few weeks. We would appreciate your feedback and comments so that we may continue to improve ourselves and the health of our patients.

## 2024-07-30 ENCOUNTER — Ambulatory Visit: Admitting: Gastroenterology

## 2024-07-31 ENCOUNTER — Encounter: Payer: Self-pay | Admitting: Gastroenterology

## 2024-09-25 ENCOUNTER — Emergency Department (HOSPITAL_COMMUNITY)
Admission: EM | Admit: 2024-09-25 | Discharge: 2024-09-25 | Disposition: A | Discharge status: Home / Self Care | Source: Ambulatory Visit | Attending: Emergency Medicine | Admitting: Emergency Medicine

## 2024-09-25 ENCOUNTER — Emergency Department (HOSPITAL_COMMUNITY)

## 2024-09-25 ENCOUNTER — Other Ambulatory Visit: Payer: Self-pay

## 2024-09-25 ENCOUNTER — Encounter (HOSPITAL_COMMUNITY): Payer: Self-pay

## 2024-09-25 DIAGNOSIS — Z7902 Long term (current) use of antithrombotics/antiplatelets: Secondary | ICD-10-CM | POA: Diagnosis not present

## 2024-09-25 DIAGNOSIS — N50811 Right testicular pain: Secondary | ICD-10-CM | POA: Diagnosis present

## 2024-09-25 LAB — URINALYSIS, ROUTINE W REFLEX MICROSCOPIC
Bilirubin Urine: NEGATIVE
Glucose, UA: NEGATIVE mg/dL
Hgb urine dipstick: NEGATIVE
Ketones, ur: NEGATIVE mg/dL
Leukocytes,Ua: NEGATIVE
Nitrite: NEGATIVE
Protein, ur: NEGATIVE mg/dL
Specific Gravity, Urine: 1.01 (ref 1.005–1.030)
pH: 6 (ref 5.0–8.0)

## 2024-09-25 LAB — CBC WITH DIFFERENTIAL/PLATELET
Abs Immature Granulocytes: 0.06 K/uL (ref 0.00–0.07)
Basophils Absolute: 0.1 K/uL (ref 0.0–0.1)
Basophils Relative: 1 %
Eosinophils Absolute: 0.2 K/uL (ref 0.0–0.5)
Eosinophils Relative: 2 %
HCT: 49.6 % (ref 39.0–52.0)
Hemoglobin: 17.5 g/dL — ABNORMAL HIGH (ref 13.0–17.0)
Immature Granulocytes: 1 %
Lymphocytes Relative: 19 %
Lymphs Abs: 1.9 K/uL (ref 0.7–4.0)
MCH: 31.7 pg (ref 26.0–34.0)
MCHC: 35.3 g/dL (ref 30.0–36.0)
MCV: 89.9 fL (ref 80.0–100.0)
Monocytes Absolute: 1.1 K/uL — ABNORMAL HIGH (ref 0.1–1.0)
Monocytes Relative: 12 %
Neutro Abs: 6.5 K/uL (ref 1.7–7.7)
Neutrophils Relative %: 65 %
Platelets: 295 K/uL (ref 150–400)
RBC: 5.52 MIL/uL (ref 4.22–5.81)
RDW: 13.3 % (ref 11.5–15.5)
WBC: 9.9 K/uL (ref 4.0–10.5)
nRBC: 0 % (ref 0.0–0.2)

## 2024-09-25 LAB — COMPREHENSIVE METABOLIC PANEL WITH GFR
ALT: 46 U/L — ABNORMAL HIGH (ref 0–44)
AST: 30 U/L (ref 15–41)
Albumin: 4.5 g/dL (ref 3.5–5.0)
Alkaline Phosphatase: 67 U/L (ref 38–126)
Anion gap: 14 (ref 5–15)
BUN: 18 mg/dL (ref 8–23)
CO2: 28 mmol/L (ref 22–32)
Calcium: 9.7 mg/dL (ref 8.9–10.3)
Chloride: 91 mmol/L — ABNORMAL LOW (ref 98–111)
Creatinine, Ser: 1.39 mg/dL — ABNORMAL HIGH (ref 0.61–1.24)
GFR, Estimated: 52 mL/min — ABNORMAL LOW
Glucose, Bld: 108 mg/dL — ABNORMAL HIGH (ref 70–99)
Potassium: 3.6 mmol/L (ref 3.5–5.1)
Sodium: 134 mmol/L — ABNORMAL LOW (ref 135–145)
Total Bilirubin: 1 mg/dL (ref 0.0–1.2)
Total Protein: 7.5 g/dL (ref 6.5–8.1)

## 2024-09-25 NOTE — Discharge Instructions (Signed)
 Please follow-up closely with urology on an outpatient basis for reevaluation.  Return to emergency department immediately for any new or worsening symptoms.

## 2024-09-25 NOTE — ED Provider Notes (Signed)
 "  EMERGENCY DEPARTMENT AT [facility] Provider Note   CSN: [id number] Arrival date & time: 09/25/24  1314     Patient presents with: Testicle Pain   Austin Bruce is a 79 y.o. male.   Patient is a 79 year old male who presents emergency department the chief complaint of right sided testicle pain which has been ongoing for the past few days.  He notes that he has had symptoms similar to this for approximate the past 5 years which have been intermittent in nature.  He was evaluated by his primary care doctor's office yesterday with outpatient ultrasound with concern for possible intermittent torsion.  Patient denies any previous history of testicular surgeries.  He denies any dysuria or hematuria.  He has had no pain in his abdomen or back.  He denies any recent falls or blunt trauma to his testicles.   Testicle Pain       Prior to Admission medications  Medication Sig Start Date End Date Taking? Authorizing Provider  amLODipine  (NORVASC ) 10 MG tablet Take 10 mg by mouth daily.    [provider]  clopidogrel  (PLAVIX ) 75 MG tablet Take 75 mg by mouth daily.    [provider]  ezetimibe (ZETIA) 10 MG tablet Take 10 mg by mouth daily. 09/09/23   [provider]  indapamide  (LOZOL ) 2.5 MG tablet Take 2.5 mg by mouth daily.    [provider]  levothyroxine (SYNTHROID) 25 MCG tablet Take 25 mcg by mouth daily.    [provider]  lisinopril  (ZESTRIL ) 40 MG tablet Take 40 mg by mouth daily.    [provider]  loratadine (CLARITIN) 10 MG tablet Take 10 mg by mouth daily.    [provider]  meclizine (ANTIVERT) 12.5 MG tablet Take 12.5 mg by mouth 3 (three) times daily as needed for nausea. 07/28/22   [provider]  metoprolol  succinate (TOPROL -XL) 25 MG 24 hr tablet TAKE 1/2 (ONE-HALF) TABLET BY MOUTH AT BEDTIME 05/16/24   Swinyer, Rosaline CHRISTELLA, NP  Omega-3 Fatty Acids (FISH OIL) 1200 MG CAPS Take 1  capsule by mouth daily.    [provider]  OXYGEN  Inhale 2 L into the lungs every evening.    [provider]  potassium chloride  SA (KLOR-CON  M) 20 MEQ tablet Take 20 mEq by mouth 2 (two) times daily. 09/09/23   [provider]  RESTASIS 0.05 % ophthalmic emulsion Apply to eye. 06/15/22   [provider]  rosuvastatin  (CRESTOR ) 20 MG tablet Take 1 tablet (20 mg total) by mouth daily. 01/19/22   Shlomo Wilbert SAUNDERS, MD  tamsulosin  (FLOMAX ) 0.4 MG CAPS capsule Take 0.4 mg by mouth daily. 11/14/22   [provider]    Allergies: Penicillins    Review of Systems  Genitourinary:  Positive for testicular pain.  All other systems reviewed and are negative.   Updated Vital Signs BP (!) 158/100   Pulse 98   Temp 97.8 F (36.6 C) (Temporal)   Resp 18   Ht 5' 9 (1.753 m)   Wt 98.4 kg   SpO2 97%   BMI 32.04 kg/m   Physical Exam Vitals and nursing note reviewed.  Constitutional:      General: He is not in acute distress.    Appearance: Normal appearance. He is not ill-appearing.  HENT:     Head: Normocephalic and atraumatic.     Nose: Nose normal.     Mouth/Throat:     Mouth: Mucous  membranes are moist.  Eyes:     Extraocular Movements: Extraocular movements intact.     Conjunctiva/sclera: Conjunctivae normal.     Pupils: Pupils are equal, round, and reactive to light.  Cardiovascular:     Rate and Rhythm: Normal rate and regular rhythm.     Pulses: Normal pulses.     Heart sounds: Normal heart sounds. No murmur heard.    No gallop.  Pulmonary:     Effort: Pulmonary effort is normal. No respiratory distress.     Breath sounds: Normal breath sounds. No stridor. No wheezing, rhonchi or rales.  Abdominal:     General: Abdomen is flat. Bowel sounds are normal. There is no distension.     Palpations: Abdomen is soft.     Tenderness: There is no abdominal tenderness. There is no guarding.  Genitourinary:    Penis: Normal.      Comments:  Tender to palpation along the spermatic cord, tender to palpation over the right testicle, mild edema, no perineal edema or erythema Musculoskeletal:        General: Normal range of motion.     Cervical back: Normal range of motion and neck supple. No rigidity or tenderness.  Skin:    General: Skin is warm and dry.  Neurological:     General: No focal deficit present.     Mental Status: He is alert and oriented to person, place, and time. Mental status is at baseline.  Psychiatric:        Mood and Affect: Mood normal.        Behavior: Behavior normal.        Thought Content: Thought content normal.        Judgment: Judgment normal.     (all labs ordered are listed, but only abnormal results are displayed) Labs Reviewed  CBC WITH DIFFERENTIAL/PLATELET - Abnormal; Notable for the following components:      Result Value   Hemoglobin 17.5 (*)    Monocytes Absolute 1.1 (*)    All other components within normal limits  COMPREHENSIVE METABOLIC PANEL WITH GFR  URINALYSIS, ROUTINE W REFLEX MICROSCOPIC    EKG: None  Radiology: No results found.   Procedures   Medications Ordered in the ED - No data to display                                  Medical Decision Making Amount and/or Complexity of Data Reviewed Labs: ordered. Radiology: ordered.   This patient presents to the ED for concern of testicle pain differential diagnosis includes testicular torsion, epididymitis, testicular mass, varicocele, hydrocele, pyelonephritis, kidney stone    Additional history obtained:  Additional history obtained from medical records External records from outside source obtained and reviewed including medical records   Lab Tests:  I Ordered, and personally interpreted labs.  The pertinent results include: No leukocytosis, no anemia, creatinine at baseline, normal electrolytes and liver function, unremarkable urinalysis   Imaging Studies ordered:  I ordered imaging studies  including scrotal ultrasound I independently visualized and interpreted imaging which showed bilateral varicoceles, no indication for torsion or epididymitis I agree with the radiologist interpretation    Problem List / ED Course:  Patient is doing well at this time and is stable for discharge home.  Did discuss patient case with Dr. Alvaro with urology regarding his ultrasound results today as well as yesterday.  He does note that patient can be  discharged home with outpatient follow-up with urology.  Urinalysis is unremarkable at this point.  Do not suspect pyelonephritis, urinary tract infection, epididymitis at this point.  Patient has no active complaints or pain at this time.  Discussed the need for close follow-up with urology as well as strict turn precautions for any new or worsening symptoms.  Patient voiced understanding and had no additional questions.   Social Determinants of Health:  None        Final diagnoses:  None    ED Discharge Orders     None          Daralene Lonni BIRCH, [place] 09/25/24 1540  "

## 2024-09-25 NOTE — ED Triage Notes (Signed)
 Pt arrived via POV from [facility] for further evaluation of possible testicular torsion in his right testicle. Pt reports his doctor did a sonogram in the office yesterday and advised the Pt to come to the ER due to concern he is losing blood flow to his testicle. Pt reports pain has been off and on for the past 5 years.

## 2024-10-17 ENCOUNTER — Ambulatory Visit: Admitting: Urology
